# Patient Record
Sex: Male | Born: 1950 | Race: Black or African American | Hispanic: No | Marital: Married | Smoking: Never smoker
Health system: Southern US, Community
[De-identification: ages and names within clinical notes are randomized; demographics above are authoritative.]

## PROBLEM LIST (undated history)

## (undated) DIAGNOSIS — D696 Thrombocytopenia, unspecified: Secondary | ICD-10-CM

## (undated) DIAGNOSIS — E785 Hyperlipidemia, unspecified: Secondary | ICD-10-CM

## (undated) DIAGNOSIS — I10 Essential (primary) hypertension: Secondary | ICD-10-CM

## (undated) DIAGNOSIS — Z889 Allergy status to unspecified drugs, medicaments and biological substances status: Secondary | ICD-10-CM

## (undated) DIAGNOSIS — D72819 Decreased white blood cell count, unspecified: Secondary | ICD-10-CM

## (undated) DIAGNOSIS — M4722 Other spondylosis with radiculopathy, cervical region: Secondary | ICD-10-CM

## (undated) DIAGNOSIS — T7840XA Allergy, unspecified, initial encounter: Secondary | ICD-10-CM

## (undated) DIAGNOSIS — M1990 Unspecified osteoarthritis, unspecified site: Secondary | ICD-10-CM

## (undated) DIAGNOSIS — K219 Gastro-esophageal reflux disease without esophagitis: Secondary | ICD-10-CM

## (undated) DIAGNOSIS — N63 Unspecified lump in unspecified breast: Secondary | ICD-10-CM

## (undated) HISTORY — DX: Unspecified lump in unspecified breast: N63.0

## (undated) HISTORY — DX: Unspecified osteoarthritis, unspecified site: M19.90

## (undated) HISTORY — DX: Allergy, unspecified, initial encounter: T78.40XA

## (undated) HISTORY — DX: Gastro-esophageal reflux disease without esophagitis: K21.9

## (undated) HISTORY — DX: Other spondylosis with radiculopathy, cervical region: M47.22

## (undated) HISTORY — DX: Thrombocytopenia, unspecified: D69.6

## (undated) HISTORY — DX: Essential (primary) hypertension: I10

## (undated) HISTORY — DX: Hyperlipidemia, unspecified: E78.5

## (undated) HISTORY — DX: Allergy status to unspecified drugs, medicaments and biological substances: Z88.9

## (undated) HISTORY — DX: Decreased white blood cell count, unspecified: D72.819

---

## 2000-04-16 HISTORY — PX: OTHER SURGICAL HISTORY: SHX169

## 2001-12-31 ENCOUNTER — Encounter: Payer: Self-pay | Admitting: General Surgery

## 2001-12-31 ENCOUNTER — Ambulatory Visit (HOSPITAL_COMMUNITY): Admission: RE | Admit: 2001-12-31 | Discharge: 2001-12-31 | Payer: Self-pay | Admitting: General Surgery

## 2003-06-16 ENCOUNTER — Encounter: Payer: Self-pay | Admitting: Orthopaedic Surgery

## 2003-09-10 ENCOUNTER — Encounter: Admission: RE | Admit: 2003-09-10 | Discharge: 2003-09-10 | Payer: Self-pay | Admitting: Oncology

## 2003-09-10 ENCOUNTER — Encounter (HOSPITAL_COMMUNITY): Admission: RE | Admit: 2003-09-10 | Discharge: 2003-10-10 | Payer: Self-pay | Admitting: Oncology

## 2004-05-30 ENCOUNTER — Ambulatory Visit (HOSPITAL_COMMUNITY): Admission: RE | Admit: 2004-05-30 | Discharge: 2004-05-30 | Payer: Self-pay | Admitting: Family Medicine

## 2004-09-08 ENCOUNTER — Ambulatory Visit (HOSPITAL_COMMUNITY): Payer: Self-pay | Admitting: Oncology

## 2004-09-08 ENCOUNTER — Encounter: Admission: RE | Admit: 2004-09-08 | Discharge: 2004-09-08 | Payer: Self-pay | Admitting: Oncology

## 2004-09-08 ENCOUNTER — Encounter (HOSPITAL_COMMUNITY): Admission: RE | Admit: 2004-09-08 | Discharge: 2004-10-08 | Payer: Self-pay | Admitting: Oncology

## 2005-04-16 HISTORY — PX: CHOLECYSTECTOMY: SHX55

## 2005-12-20 ENCOUNTER — Ambulatory Visit (HOSPITAL_COMMUNITY): Admission: RE | Admit: 2005-12-20 | Discharge: 2005-12-20 | Payer: Self-pay | Admitting: Family Medicine

## 2005-12-20 ENCOUNTER — Ambulatory Visit: Payer: Self-pay | Admitting: Family Medicine

## 2005-12-20 LAB — CONVERTED CEMR LAB: PSA: NORMAL ng/mL

## 2005-12-21 ENCOUNTER — Ambulatory Visit (HOSPITAL_COMMUNITY): Admission: RE | Admit: 2005-12-21 | Discharge: 2005-12-21 | Payer: Self-pay | Admitting: Family Medicine

## 2005-12-28 ENCOUNTER — Ambulatory Visit: Payer: Self-pay | Admitting: Internal Medicine

## 2006-01-01 ENCOUNTER — Ambulatory Visit: Payer: Self-pay | Admitting: Internal Medicine

## 2006-01-04 ENCOUNTER — Ambulatory Visit (HOSPITAL_COMMUNITY): Admission: RE | Admit: 2006-01-04 | Discharge: 2006-01-04 | Payer: Self-pay | Admitting: Internal Medicine

## 2006-01-04 ENCOUNTER — Ambulatory Visit: Payer: Self-pay | Admitting: Internal Medicine

## 2006-02-01 ENCOUNTER — Ambulatory Visit: Payer: Self-pay | Admitting: Family Medicine

## 2006-02-15 ENCOUNTER — Ambulatory Visit: Payer: Self-pay | Admitting: Cardiology

## 2006-04-05 ENCOUNTER — Encounter (INDEPENDENT_AMBULATORY_CARE_PROVIDER_SITE_OTHER): Payer: Self-pay | Admitting: Specialist

## 2006-04-05 ENCOUNTER — Ambulatory Visit (HOSPITAL_COMMUNITY): Admission: RE | Admit: 2006-04-05 | Discharge: 2006-04-05 | Payer: Self-pay | Admitting: General Surgery

## 2006-05-03 ENCOUNTER — Ambulatory Visit: Payer: Self-pay | Admitting: Family Medicine

## 2007-01-17 ENCOUNTER — Ambulatory Visit: Payer: Self-pay | Admitting: Family Medicine

## 2007-01-17 LAB — CONVERTED CEMR LAB
BUN: 21 mg/dL (ref 6–23)
Basophils Absolute: 0 10*3/uL (ref 0.0–0.1)
Basophils Relative: 1 % (ref 0–1)
CO2: 26 meq/L (ref 19–32)
Calcium: 8.9 mg/dL (ref 8.4–10.5)
Chloride: 103 meq/L (ref 96–112)
Cholesterol: 169 mg/dL (ref 0–200)
Creatinine, Ser: 1.41 mg/dL (ref 0.40–1.50)
Eosinophils Absolute: 0.1 10*3/uL (ref 0.0–0.7)
Eosinophils Relative: 3 % (ref 0–5)
Glucose, Bld: 84 mg/dL (ref 70–99)
HCT: 41.1 % (ref 39.0–52.0)
HDL: 57 mg/dL (ref >39)
Hemoglobin: 13.9 g/dL (ref 13.0–17.0)
LDL Cholesterol: 100 mg/dL — ABNORMAL HIGH (ref 0–99)
Lymphocytes Relative: 41 % (ref 12–46)
Lymphs Abs: 1.4 10*3/uL (ref 0.7–3.3)
MCHC: 33.8 g/dL (ref 30.0–36.0)
MCV: 66.4 fL — ABNORMAL LOW (ref 78.0–100.0)
Monocytes Absolute: 0.2 10*3/uL (ref 0.2–0.7)
Monocytes Relative: 6 % (ref 3–11)
Neutro Abs: 1.7 10*3/uL (ref 1.7–7.7)
Neutrophils Relative %: 49 % (ref 43–77)
Platelets: 120 10*3/uL — ABNORMAL LOW (ref 150–400)
Potassium: 4.4 meq/L (ref 3.5–5.3)
RBC: 6.19 M/uL — ABNORMAL HIGH (ref 4.22–5.81)
RDW: 15.9 % — ABNORMAL HIGH (ref 11.5–14.0)
Sodium: 139 meq/L (ref 135–145)
Total CHOL/HDL Ratio: 3
Triglycerides: 60 mg/dL (ref <150)
VLDL: 12 mg/dL (ref 0–40)
WBC: 3.4 10*3/uL — ABNORMAL LOW (ref 4.0–10.5)

## 2007-02-18 ENCOUNTER — Encounter (HOSPITAL_COMMUNITY): Admission: RE | Admit: 2007-02-18 | Discharge: 2007-03-20 | Payer: Self-pay | Admitting: Oncology

## 2007-02-18 ENCOUNTER — Ambulatory Visit (HOSPITAL_COMMUNITY): Payer: Self-pay | Admitting: Oncology

## 2007-04-17 ENCOUNTER — Encounter: Payer: Self-pay | Admitting: Family Medicine

## 2007-06-13 ENCOUNTER — Ambulatory Visit: Payer: Self-pay | Admitting: Family Medicine

## 2007-06-20 ENCOUNTER — Encounter: Admission: RE | Admit: 2007-06-20 | Discharge: 2007-06-20 | Payer: Self-pay | Admitting: Family Medicine

## 2007-08-02 ENCOUNTER — Encounter: Payer: Self-pay | Admitting: Family Medicine

## 2007-08-02 LAB — CONVERTED CEMR LAB
BUN: 18 mg/dL (ref 6–23)
Basophils Absolute: 0 10*3/uL (ref 0.0–0.1)
Basophils Relative: 1 % (ref 0–1)
CO2: 27 meq/L (ref 19–32)
Calcium: 8.9 mg/dL (ref 8.4–10.5)
Chloride: 104 meq/L (ref 96–112)
Creatinine, Ser: 1.35 mg/dL (ref 0.40–1.50)
Eosinophils Absolute: 0.1 10*3/uL (ref 0.0–0.7)
Eosinophils Relative: 3 % (ref 0–5)
Glucose, Bld: 87 mg/dL (ref 70–99)
HCT: 41.4 % (ref 39.0–52.0)
Hemoglobin: 13.7 g/dL (ref 13.0–17.0)
Lymphocytes Relative: 46 % (ref 12–46)
Lymphs Abs: 1.7 10*3/uL (ref 0.7–4.0)
MCHC: 33.1 g/dL (ref 30.0–36.0)
MCV: 67.4 fL — ABNORMAL LOW (ref 78.0–100.0)
Monocytes Absolute: 0.3 10*3/uL (ref 0.1–1.0)
Monocytes Relative: 7 % (ref 3–12)
Neutro Abs: 1.6 10*3/uL — ABNORMAL LOW (ref 1.7–7.7)
Neutrophils Relative %: 43 % (ref 43–77)
Platelets: 123 10*3/uL — ABNORMAL LOW (ref 150–400)
Potassium: 4.3 meq/L (ref 3.5–5.3)
RBC: 6.14 M/uL — ABNORMAL HIGH (ref 4.22–5.81)
RDW: 15.3 % (ref 11.5–15.5)
Sodium: 140 meq/L (ref 135–145)
WBC: 3.7 10*3/uL — ABNORMAL LOW (ref 4.0–10.5)

## 2007-09-05 ENCOUNTER — Encounter (HOSPITAL_BASED_OUTPATIENT_CLINIC_OR_DEPARTMENT_OTHER): Payer: Self-pay | Admitting: General Surgery

## 2007-09-05 ENCOUNTER — Ambulatory Visit (HOSPITAL_BASED_OUTPATIENT_CLINIC_OR_DEPARTMENT_OTHER): Admission: RE | Admit: 2007-09-05 | Discharge: 2007-09-05 | Payer: Self-pay | Admitting: General Surgery

## 2007-09-06 HISTORY — PX: OTHER SURGICAL HISTORY: SHX169

## 2007-11-17 ENCOUNTER — Encounter: Payer: Self-pay | Admitting: Family Medicine

## 2007-11-21 ENCOUNTER — Encounter: Payer: Self-pay | Admitting: Family Medicine

## 2007-11-21 DIAGNOSIS — K219 Gastro-esophageal reflux disease without esophagitis: Secondary | ICD-10-CM

## 2007-11-21 DIAGNOSIS — E785 Hyperlipidemia, unspecified: Secondary | ICD-10-CM

## 2007-11-21 DIAGNOSIS — N63 Unspecified lump in unspecified breast: Secondary | ICD-10-CM

## 2007-11-21 DIAGNOSIS — I10 Essential (primary) hypertension: Secondary | ICD-10-CM | POA: Insufficient documentation

## 2007-12-05 ENCOUNTER — Ambulatory Visit: Payer: Self-pay | Admitting: Family Medicine

## 2007-12-05 LAB — CONVERTED CEMR LAB
BUN: 22 mg/dL (ref 6–23)
Basophils Absolute: 0 10*3/uL (ref 0.0–0.1)
Basophils Relative: 0 % (ref 0–1)
CO2: 25 meq/L (ref 19–32)
Calcium: 8.7 mg/dL (ref 8.4–10.5)
Chloride: 104 meq/L (ref 96–112)
Cholesterol: 157 mg/dL (ref 0–200)
Creatinine, Ser: 1.24 mg/dL (ref 0.40–1.50)
Eosinophils Absolute: 0.1 10*3/uL (ref 0.0–0.7)
Eosinophils Relative: 4 % (ref 0–5)
Glucose, Bld: 93 mg/dL (ref 70–99)
HCT: 41 % (ref 39.0–52.0)
HDL: 54 mg/dL (ref >39)
Hemoglobin: 13.2 g/dL (ref 13.0–17.0)
LDL Cholesterol: 90 mg/dL (ref 0–99)
Lymphocytes Relative: 39 % (ref 12–46)
Lymphs Abs: 1.3 10*3/uL (ref 0.7–4.0)
MCHC: 32.2 g/dL (ref 30.0–36.0)
MCV: 70.9 fL — ABNORMAL LOW (ref 78.0–100.0)
Monocytes Absolute: 0.3 10*3/uL (ref 0.1–1.0)
Monocytes Relative: 8 % (ref 3–12)
Neutro Abs: 1.6 10*3/uL — ABNORMAL LOW (ref 1.7–7.7)
Neutrophils Relative %: 49 % (ref 43–77)
Platelets: 127 10*3/uL — ABNORMAL LOW (ref 150–400)
Potassium: 4.1 meq/L (ref 3.5–5.3)
RBC: 5.77 M/uL (ref 4.22–5.81)
RDW: 15.5 % (ref 11.5–15.5)
Sodium: 138 meq/L (ref 135–145)
Total CHOL/HDL Ratio: 2.9
Triglycerides: 63 mg/dL (ref <150)
VLDL: 13 mg/dL (ref 0–40)
WBC: 3.3 10*3/uL — ABNORMAL LOW (ref 4.0–10.5)

## 2007-12-08 DIAGNOSIS — J309 Allergic rhinitis, unspecified: Secondary | ICD-10-CM

## 2007-12-08 DIAGNOSIS — D72819 Decreased white blood cell count, unspecified: Secondary | ICD-10-CM | POA: Insufficient documentation

## 2007-12-08 DIAGNOSIS — D696 Thrombocytopenia, unspecified: Secondary | ICD-10-CM | POA: Insufficient documentation

## 2007-12-10 ENCOUNTER — Encounter: Payer: Self-pay | Admitting: Family Medicine

## 2007-12-16 ENCOUNTER — Encounter: Payer: Self-pay | Admitting: Family Medicine

## 2008-02-06 ENCOUNTER — Ambulatory Visit: Payer: Self-pay | Admitting: Family Medicine

## 2008-03-19 ENCOUNTER — Ambulatory Visit: Payer: Self-pay | Admitting: Internal Medicine

## 2008-03-23 ENCOUNTER — Encounter: Payer: Self-pay | Admitting: Internal Medicine

## 2008-03-23 ENCOUNTER — Encounter: Payer: Self-pay | Admitting: Family Medicine

## 2008-03-23 LAB — CONVERTED CEMR LAB
ALT: 16 units/L (ref 0–53)
AST: 17 units/L (ref 0–37)
Albumin: 4.4 g/dL (ref 3.5–5.2)
Alkaline Phosphatase: 81 units/L (ref 39–117)
Amylase: 120 units/L — ABNORMAL HIGH (ref 0–105)
BUN: 19 mg/dL (ref 6–23)
Basophils Absolute: 0 10*3/uL (ref 0.0–0.1)
Basophils Relative: 1 % (ref 0–1)
CO2: 24 meq/L (ref 19–32)
Calcium: 8.8 mg/dL (ref 8.4–10.5)
Chloride: 105 meq/L (ref 96–112)
Creatinine, Ser: 1.33 mg/dL (ref 0.40–1.50)
Eosinophils Absolute: 0.1 10*3/uL (ref 0.0–0.7)
Eosinophils Relative: 2 % (ref 0–5)
Glucose, Bld: 89 mg/dL (ref 70–99)
HCT: 41.2 % (ref 39.0–52.0)
Hemoglobin: 13.6 g/dL (ref 13.0–17.0)
Lipase: 44 units/L (ref 0–75)
Lymphocytes Relative: 37 % (ref 12–46)
Lymphs Abs: 1.4 10*3/uL (ref 0.7–4.0)
MCHC: 33 g/dL (ref 30.0–36.0)
MCV: 67.9 fL — ABNORMAL LOW (ref 78.0–100.0)
Monocytes Absolute: 0.2 10*3/uL (ref 0.1–1.0)
Monocytes Relative: 6 % (ref 3–12)
Neutro Abs: 2.1 10*3/uL (ref 1.7–7.7)
Neutrophils Relative %: 54 % (ref 43–77)
Platelets: 137 10*3/uL — ABNORMAL LOW (ref 150–400)
Potassium: 4.1 meq/L (ref 3.5–5.3)
RBC: 6.07 M/uL — ABNORMAL HIGH (ref 4.22–5.81)
RDW: 15.7 % — ABNORMAL HIGH (ref 11.5–15.5)
Sodium: 143 meq/L (ref 135–145)
Total Bilirubin: 0.4 mg/dL (ref 0.3–1.2)
Total Protein: 6.7 g/dL (ref 6.0–8.3)
WBC: 3.9 10*3/uL — ABNORMAL LOW (ref 4.0–10.5)

## 2008-03-24 ENCOUNTER — Ambulatory Visit (HOSPITAL_COMMUNITY): Admission: RE | Admit: 2008-03-24 | Discharge: 2008-03-24 | Payer: Self-pay | Admitting: Internal Medicine

## 2008-03-24 ENCOUNTER — Encounter: Payer: Self-pay | Admitting: Family Medicine

## 2008-04-05 ENCOUNTER — Ambulatory Visit: Payer: Self-pay | Admitting: Gastroenterology

## 2008-05-12 ENCOUNTER — Ambulatory Visit: Payer: Self-pay | Admitting: Internal Medicine

## 2008-05-14 ENCOUNTER — Ambulatory Visit: Payer: Self-pay | Admitting: Family Medicine

## 2008-05-14 DIAGNOSIS — R109 Unspecified abdominal pain: Secondary | ICD-10-CM | POA: Insufficient documentation

## 2008-06-10 ENCOUNTER — Telehealth (INDEPENDENT_AMBULATORY_CARE_PROVIDER_SITE_OTHER): Payer: Self-pay | Admitting: *Deleted

## 2008-06-21 ENCOUNTER — Encounter (INDEPENDENT_AMBULATORY_CARE_PROVIDER_SITE_OTHER): Payer: Self-pay | Admitting: *Deleted

## 2008-08-24 ENCOUNTER — Telehealth: Payer: Self-pay | Admitting: Family Medicine

## 2008-09-17 ENCOUNTER — Ambulatory Visit: Payer: Self-pay | Admitting: Family Medicine

## 2008-09-17 DIAGNOSIS — R5383 Other fatigue: Secondary | ICD-10-CM

## 2008-09-17 DIAGNOSIS — D649 Anemia, unspecified: Secondary | ICD-10-CM

## 2008-09-17 DIAGNOSIS — M25569 Pain in unspecified knee: Secondary | ICD-10-CM

## 2008-09-17 DIAGNOSIS — M25519 Pain in unspecified shoulder: Secondary | ICD-10-CM

## 2008-09-17 DIAGNOSIS — R5381 Other malaise: Secondary | ICD-10-CM

## 2008-11-04 ENCOUNTER — Telehealth: Payer: Self-pay | Admitting: Family Medicine

## 2008-12-31 ENCOUNTER — Ambulatory Visit: Payer: Self-pay | Admitting: Family Medicine

## 2009-01-04 LAB — CONVERTED CEMR LAB: Retic Ct Pct: 0.8 % (ref 0.4–3.1)

## 2009-06-24 ENCOUNTER — Telehealth: Payer: Self-pay | Admitting: Family Medicine

## 2009-06-24 ENCOUNTER — Ambulatory Visit: Payer: Self-pay | Admitting: Family Medicine

## 2009-06-24 LAB — CONVERTED CEMR LAB
BUN: 18 mg/dL (ref 6–23)
CO2: 29 meq/L (ref 19–32)
Calcium: 9.6 mg/dL (ref 8.4–10.5)
Chloride: 102 meq/L (ref 96–112)
Cholesterol: 157 mg/dL (ref 0–200)
Creatinine, Ser: 1.36 mg/dL (ref 0.40–1.50)
Glucose, Bld: 88 mg/dL (ref 70–99)
HDL: 55 mg/dL (ref >39)
LDL Cholesterol: 92 mg/dL (ref 0–99)
Potassium: 5 meq/L (ref 3.5–5.3)
Sodium: 139 meq/L (ref 135–145)
Total CHOL/HDL Ratio: 2.9
Triglycerides: 50 mg/dL (ref <150)
VLDL: 10 mg/dL (ref 0–40)
Vit D, 25-Hydroxy: 43 ng/mL (ref 30–89)

## 2009-06-28 LAB — CONVERTED CEMR LAB
Basophils Absolute: 0 10*3/uL (ref 0.0–0.1)
Basophils Relative: 0 % (ref 0–1)
Eosinophils Absolute: 0.1 10*3/uL (ref 0.0–0.7)
Eosinophils Relative: 2 % (ref 0–5)
HCT: 43 % (ref 39.0–52.0)
Hemoglobin: 14.1 g/dL (ref 13.0–17.0)
Lymphocytes Relative: 29 % (ref 12–46)
Lymphs Abs: 1.2 10*3/uL (ref 0.7–4.0)
MCHC: 32.7 g/dL (ref 30.0–36.0)
MCV: 70 fL — ABNORMAL LOW (ref 78.0–100.0)
Monocytes Absolute: 0.3 10*3/uL (ref 0.1–1.0)
Monocytes Relative: 6 % (ref 3–12)
Neutro Abs: 2.6 10*3/uL (ref 1.7–7.7)
Neutrophils Relative %: 61 % (ref 43–77)
Platelets: 135 10*3/uL — ABNORMAL LOW (ref 150–400)
RBC: 6.14 M/uL — ABNORMAL HIGH (ref 4.22–5.81)
RDW: 15.4 % (ref 11.5–15.5)
WBC: 4.2 10*3/uL (ref 4.0–10.5)

## 2010-01-06 ENCOUNTER — Telehealth: Payer: Self-pay | Admitting: Family Medicine

## 2010-01-06 DIAGNOSIS — R55 Syncope and collapse: Secondary | ICD-10-CM | POA: Insufficient documentation

## 2010-01-13 ENCOUNTER — Ambulatory Visit: Payer: Self-pay | Admitting: Family Medicine

## 2010-02-03 ENCOUNTER — Encounter: Payer: Self-pay | Admitting: Family Medicine

## 2010-02-03 ENCOUNTER — Ambulatory Visit (HOSPITAL_COMMUNITY): Admission: RE | Admit: 2010-02-03 | Discharge: 2010-02-03 | Payer: Self-pay | Admitting: Neurology

## 2010-02-08 ENCOUNTER — Ambulatory Visit: Payer: Self-pay | Admitting: Family Medicine

## 2010-02-08 DIAGNOSIS — E049 Nontoxic goiter, unspecified: Secondary | ICD-10-CM | POA: Insufficient documentation

## 2010-02-16 ENCOUNTER — Telehealth (INDEPENDENT_AMBULATORY_CARE_PROVIDER_SITE_OTHER): Payer: Self-pay | Admitting: *Deleted

## 2010-02-17 ENCOUNTER — Telehealth (INDEPENDENT_AMBULATORY_CARE_PROVIDER_SITE_OTHER): Payer: Self-pay | Admitting: *Deleted

## 2010-02-25 ENCOUNTER — Encounter: Payer: Self-pay | Admitting: Family Medicine

## 2010-02-28 LAB — CONVERTED CEMR LAB
Basophils Absolute: 0 10*3/uL (ref 0.0–0.1)
Basophils Relative: 1 % (ref 0–1)
Eosinophils Absolute: 0.1 10*3/uL (ref 0.0–0.7)
Eosinophils Relative: 3 % (ref 0–5)
HCT: 40.9 % (ref 39.0–52.0)
Hemoglobin: 13.3 g/dL (ref 13.0–17.0)
Lymphocytes Relative: 31 % (ref 12–46)
Lymphs Abs: 1.2 10*3/uL (ref 0.7–4.0)
MCHC: 32.5 g/dL (ref 30.0–36.0)
MCV: 70.3 fL — ABNORMAL LOW (ref 78.0–100.0)
Monocytes Absolute: 0.2 10*3/uL (ref 0.1–1.0)
Monocytes Relative: 6 % (ref 3–12)
Neutro Abs: 2.3 10*3/uL (ref 1.7–7.7)
Neutrophils Relative %: 59 % (ref 43–77)
Platelets: 137 10*3/uL — ABNORMAL LOW (ref 150–400)
RBC: 5.82 M/uL — ABNORMAL HIGH (ref 4.22–5.81)
RDW: 15.2 % (ref 11.5–15.5)
WBC: 3.8 10*3/uL — ABNORMAL LOW (ref 4.0–10.5)

## 2010-03-17 ENCOUNTER — Ambulatory Visit
Admission: RE | Admit: 2010-03-17 | Discharge: 2010-03-17 | Payer: Self-pay | Source: Home / Self Care | Admitting: Family Medicine

## 2010-03-24 ENCOUNTER — Ambulatory Visit (HOSPITAL_COMMUNITY)
Admission: RE | Admit: 2010-03-24 | Discharge: 2010-03-24 | Payer: Self-pay | Source: Home / Self Care | Attending: Family Medicine | Admitting: Family Medicine

## 2010-03-29 ENCOUNTER — Encounter: Payer: Self-pay | Admitting: Family Medicine

## 2010-03-31 ENCOUNTER — Telehealth (INDEPENDENT_AMBULATORY_CARE_PROVIDER_SITE_OTHER): Payer: Self-pay | Admitting: *Deleted

## 2010-03-31 ENCOUNTER — Encounter: Payer: Self-pay | Admitting: Family Medicine

## 2010-03-31 DIAGNOSIS — R220 Localized swelling, mass and lump, head: Secondary | ICD-10-CM

## 2010-03-31 DIAGNOSIS — R221 Localized swelling, mass and lump, neck: Secondary | ICD-10-CM | POA: Insufficient documentation

## 2010-04-14 ENCOUNTER — Encounter: Payer: Self-pay | Admitting: Family Medicine

## 2010-04-21 ENCOUNTER — Ambulatory Visit (HOSPITAL_COMMUNITY)
Admission: RE | Admit: 2010-04-21 | Discharge: 2010-04-21 | Payer: Self-pay | Source: Home / Self Care | Attending: Otolaryngology | Admitting: Otolaryngology

## 2010-04-21 LAB — CREATININE, SERUM
Creatinine, Ser: 1.34 mg/dL (ref 0.4–1.5)
GFR calc Af Amer: >60 mL/min (ref >60)
GFR calc non Af Amer: 55 mL/min — ABNORMAL LOW (ref >60)

## 2010-04-28 ENCOUNTER — Encounter: Payer: Self-pay | Admitting: Family Medicine

## 2010-05-07 ENCOUNTER — Encounter: Payer: Self-pay | Admitting: Family Medicine

## 2010-05-16 NOTE — Progress Notes (Signed)
Summary: returning your call  Phone Note Call from Patient   Summary of Call: returned your call please call back at [phone] Initial call taken by: Lind Guest,  February 17, 2010 8:19 AM  Follow-up for Phone Call        Pt already aware of results of his carotid doppl;er, unsure if you had called him with appt for ultrasound, if you had pls just call him back no need to send me back a msg, ifyou didi not call just state that nadn suign off pls  Follow-up by: Syliva Overman MD,  February 17, 2010 8:29 AM  Additional Follow-up for Phone Call Additional follow up Details #1::        called patient with his appt. for his [place] on Dec. 5, at 9:30 at [facility] Additional Follow-up by: Curtis Sites,  February 17, 2010 10:07 AM

## 2010-05-16 NOTE — Progress Notes (Signed)
Summary: CALL ON CELL  Phone Note Call from Patient   Summary of Call: WHEN YOU ARE FINISHED FOR THE DAY PLEASE CALL HIM ON CELL # [phone] Initial call taken by: Lind Guest,  January 06, 2010 10:27 AM  Follow-up for Phone Call        msg left on pt's cell that i returned the call Follow-up by: Syliva Overman MD,  January 06, 2010 1:09 PM

## 2010-05-16 NOTE — Letter (Signed)
Summary: TRANSFERRED RECORDS  TRANSFERRED RECORDS   Imported By: Lind Guest 01/12/2010 16:34:13  _____________________________________________________________________  External Attachment:    Type:   Image     Comment:   External Document

## 2010-05-16 NOTE — Progress Notes (Signed)
  Phone Note Other Incoming   Caller: dr simpson Summary of Call: pls order and fax over  labs to the lab the labs ordered earlier this year, call lab personnel and let them know to expect him. Stat on the cBC because of h/o anemia pls make sure ordered as a stat,PLS have the labs reviewed , and call him if there is anything very abnormal, I am uncertain what day he is coming to get lab work. Pls call and verify lab has the order as i intend it to be done Initial call taken by: Syliva Overman MD,  January 06, 2010 1:33 PM  Follow-up for Phone Call        Faxed lab order to the lab with the CBC to be done Stat Follow-up by: Everitt Amber LPN,  January 06, 2010 3:49 PM

## 2010-05-16 NOTE — Assessment & Plan Note (Signed)
Summary: FLU SHOT  Nurse Visit   Vitals Entered By: Everitt Amber LPN (January 13, 2010 10:27 AM) Comments Patient in to recieve flu vaccine     Allergies: 1)  ! Compazine  Orders Added: 1)  Influenza Vaccine NON MCR [00028]   Influenza Vaccine    Vaccine Type: Fluvax Non-MCR    Site: left deltoid    Mfr: novartis     Dose: 0.5 ml    Route: IM    Given by: Everitt Amber LPN    Exp. Date: 08/2010    Lot #: [id number]

## 2010-05-16 NOTE — Assessment & Plan Note (Signed)
Summary: office visit   Vital Signs:  Patient profile:   59 year old male Height:      73 inches Weight:      181.25 pounds BMI:     24.00 O2 Sat:      98 % on Room air Pulse rate:   91 / minute Resp:     16 per minute BP sitting:   120 / 70  (left arm)  Vitals Entered By: Mauricia Area CMA (February 08, 2010 2:01 PM)  O2 Flow:  Room air CC: follow up   Primary Care Provider:  Syliva Overman MD  CC:  follow up.  History of Present Illness: Reports  that he has been  doing well. He has concerns about  new swelling under his neck which will be biopsied later today, as well as neck swelling and possible goiter Denies recent fever or chills. Denies sinus pressure, nasal congestion , ear pain or sore throat. Denies chest congestion, or cough productive of sputum. Denies chest pain, palpitations, PND, orthopnea or leg swelling. Denies abdominal pain, nausea, vomitting, diarrhea or constipation. Denies change in bowel movements or bloody stool. Denies dysuria , frequency, incontinence or hesitancy. Persitent bilateral knee pain, denies swelling Denies headaches, vertigo, seizures. Denies depression, anxiety or insomnia. Denies  rash, lesions, or itch.     Allergies (verified): 1)  ! Compazine  Review of Systems      See HPI General:  Complains of fatigue; denies chills and fever; chronic fatigue, recent "sleep attack" currently being evaluated by neurology. Eyes:  Complains of vision loss-both eyes; denies blurring and discharge; wears corrective lenses. Endo:  Denies cold intolerance, excessive thirst, excessive urination, and heat intolerance. Heme:  Denies abnormal bruising and bleeding. Allergy:  Denies hives or rash and itching eyes.  Physical Exam  General:  alert, well-nourished, and well-hydrated. HEENT: No facial asymmetry,  EOMI, No sinus tenderness, TM's Clear, oropharynx  pink and moist.Goiter, and submental as well as right neck mass   Chest: Clear to  auscultation bilaterally.  CVS: S1, S2, No murmurs, No S3.   Abd: Soft, Nontender.  MS: Adequate ROM spine, hips, shoulders and knees.  Ext: No edema.   CNS: CN 2-12 intact, power tone and sensation normal throughout.   Skin: Intact, no visible lesions or rashes.  Psych: Good eye contact, normal affect.  Memory intact, not anxious or depressed appearing.       Impression & Recommendations:  Problem # 1:  GOITER, UNSPECIFIED (ICD-240.9) Assessment Comment Only  Orders: Radiology other (Radiology Other)  Problem # 2:  SYNCOPE (ICD-780.2) Assessment: Comment Only currently being evaluated by neurology. His carotid doppler shows no significant stenosis  Problem # 3:  HYPERTENSION (ICD-401.9) Assessment: Unchanged  His updated medication list for this problem includes:    Enalapril Maleate 5 Mg Tabs (Enalapril maleate) .[name]... Take 1 tablet by mouth once a day  Orders: T-Basic Metabolic Panel [phone])  BP today: 120/70 Prior BP: 112/82 (06/24/2009)  Labs Reviewed: K+: 5.0 (06/24/2009) Creat: : 1.36 (06/24/2009)   Chol: 157 (06/24/2009)   HDL: 55 (06/24/2009)   LDL: 92 (06/24/2009)   TG: 50 (06/24/2009)  Complete Medication List: 1)  Aspirin Adult Low Strength 81 Mg Tbec (Aspirin) .... Take 1 tablet by mouth once a day 2)  Mens Multivitamin Plus Tabs (Multiple vitamins-minerals) .... Take 1 tablet by mouth once a day 3)  Astelin 137 Mcg/spray Soln (Azelastine hcl) .... 2 puffs two times a day 4)  Enalapril Maleate 5  Mg Tabs (Enalapril maleate) .... Take 1 tablet by mouth once a day  Other Orders: T-CBC w/Diff ([id number]) T-TSH [phone])  Patient Instructions: 1)  ,Follow up appointment in 5.31months 2)  BMP prior to visit, ICD-9: 3)  TSH prior to visit, ICD-9:  fasting asap, pls run CBC as a stat 4)  CBC w/ Diff prior to visit, ICD-9: 5)  No med changes, 6)  Pls start some exercise   Orders Added: 1)  Radiology other [Radiology Other] 2)  Est. Patient  Level IV [29562] 3)  T-CBC w/Diff [[id number]] 4)  T-Basic Metabolic Panel [[id number]] 5)  T-TSH [[id number]]  Appended Document: office visit pls let Dr Lina Sar know that i reviewed the report of the carotid doppler , and there is no significant blockage showing in the arteries in his neck  Appended Document: office visit CALLED PATIENT, LEFT MESSAGE  Appended Document: office visit patient aware

## 2010-05-16 NOTE — Progress Notes (Signed)
Summary: LABS  Phone Note Call from Patient   Summary of Call: LAB CALLED AND WANTS TO KNOW DID YOU RECEIVE ON HIS LABSPLEASE CALL BACK [phone] TO LET TINA KNOW Initial call taken by: Lind Guest,  June 24, 2009 10:52 AM  Follow-up for Phone Call        yes Follow-up by: Syliva Overman MD,  June 24, 2009 12:55 PM
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
## 2010-05-16 NOTE — Assessment & Plan Note (Signed)
Summary: office visit   Vital Signs:  Patient profile:   60 year old male Height:      73 inches Weight:      181.50 pounds BMI:     24.03 Pulse rate:   71 / minute Pulse rhythm:   regular Resp:     16 per minute BP sitting:   112 / 82  (left arm)  Vitals Entered By: Adella Hare LPN (June 24, 2009 8:16 AM) CC: follow-up visit Is Patient Diabetic? No Pain Assessment Patient in pain? no        CC:  follow-up visit.  History of Present Illness: Reports  that he has been  doing well. Denies recent fever or chills. Denies sinus pressure, nasal congestion , ear pain or sore throat. Denies chest congestion, or cough productive of sputum. Denies chest pain, palpitations, PND, orthopnea or leg swelling. Denies abdominal pain, nausea, vomitting, diarrhea or constipation. Denies change in bowel movements or bloody stool. Denies dysuria , frequency, incontinence or hesitancy. Pt has chronic knee pain and bilateral carpl tunnel syndrome. He does not exercise , but understands the importance of doing so and plans to start. Denies headaches, vertigo, seizures. Denies depression, anxiety or insomnia. Denies  rash, lesions, or itch.     Allergies (verified): 1)  ! Compazine  Review of Systems      See HPI Eyes:  Denies blurring, discharge, and double vision. MS:  See HPI; Complains of joint pain and stiffness. Endo:  Denies cold intolerance, excessive hunger, excessive thirst, excessive urination, heat intolerance, polyuria, and weight change. Heme:  Denies abnormal bruising and bleeding. Allergy:  Complains of seasonal allergies; mild, uses astelin prn.  Physical Exam  General:  alert, well-nourished, and well-hydrated. HEENT: No facial asymmetry,  EOMI, No sinus tenderness, TM's Clear, oropharynx  pink and moist.   Chest: Clear to auscultation bilaterally.  CVS: S1, S2, No murmurs, No S3.   Abd: Soft, Nontender.  MS: Adequate ROM spine, hips, shoulders and knees.    Ext: No edema.   CNS: CN 2-12 intact, power tone and sensation normal throughout.   Skin: Intact, no visible lesions or rashes.  Psych: Good eye contact, normal affect.  Memory intact, not anxious or depressed appearing.       Impression & Recommendations:  Problem # 1:  ANEMIA (ICD-285.9) Assessment Comment Only  Hgb: 13.6 (12/31/2008)   Hct: 41.4 (12/31/2008)   Platelets: 152 (12/31/2008) RBC: 5.79 (12/31/2008)   RDW: 15.5 (12/31/2008)   WBC: 3.4 (12/31/2008) MCV: 71.5 (12/31/2008)   MCHC: 32.9 (12/31/2008) Retic Ct: 46.3 K/uL (12/31/2008)   Ferritin: 79 (12/31/2008) Iron: 47 (12/31/2008)   TIBC: 315 (12/31/2008)   % Sat: 15 (12/31/2008) B12: 799 (12/31/2008)   Folate: >20.0 ng/mL (12/31/2008)   TSH: 2.298 (12/31/2008)  Problem # 2:  ALLERGIC RHINITIS (ICD-477.9) Assessment: Unchanged  His updated medication list for this problem includes:    Astelin 137 Mcg/spray Soln (Azelastine hcl) .[name]... 2 puffs two times a day  Problem # 3:  * HEREDITARY THYROMBOCYTOPENIA IN 2006 Assessment: Comment Only  Problem # 4:  HYPERTENSION (ICD-401.9) Assessment: Improved  The following medications were removed from the medication list:    Enalapril Maleate 5 Mg Tabs (Enalapril maleate) ..... One tab by mouth qd His updated medication list for this problem includes:    Enalapril Maleate 5 Mg Tabs (Enalapril maleate) .[name]... Take 1 tablet by mouth once a day  Orders: T-Basic Metabolic Panel [phone])  BP today: 112/82 Prior BP: 130/82 (  12/31/2008)  Labs Reviewed: K+: 4.3 (12/31/2008) Creat: : 1.28 (12/31/2008)   Chol: 167 (12/31/2008)   HDL: 53 (12/31/2008)   LDL: 104 (12/31/2008)   TG: 52 (12/31/2008)  Complete Medication List: 1)  Aspirin Adult Low Strength 81 Mg Tbec (Aspirin) .... Take 1 tablet by mouth once a day 2)  Mens Multivitamin Plus Tabs (Multiple vitamins-minerals) .... Take 1 tablet by mouth once a day 3)  Astelin 137 Mcg/spray Soln (Azelastine hcl) .... 2 puffs two  times a day 4)  Enalapril Maleate 5 Mg Tabs (Enalapril maleate) .... Take 1 tablet by mouth once a day  Other Orders: T-CBC w/Diff ([id number]) T-Lipid Profile [phone]) T-Vitamin D (25-Hydroxy) [phone])  Patient Instructions: 1)  F/u in 5months and 3weeks 2)  BMP prior to visit, ICD-9: 3)  Lipid Panel prior to visit, ICD-9: 4)  CBC w/ Diff prior to visit, ICD-9: 5)  vitamin d Prescriptions: ENALAPRIL MALEATE 5 MG TABS (ENALAPRIL MALEATE) Take 1 tablet by mouth once a day  #90 x 3   Entered and Authorized by:   Syliva Overman MD   Signed by:   Syliva Overman MD on 06/24/2009   Method used:   Electronically to        [facility] [phone]* (retail)       [address]       [place], [place]  [address]       Ph: [phone] or [phone]       Fax: [phone]   RxID:   [id number]

## 2010-05-16 NOTE — Progress Notes (Signed)
Summary: please call  Phone Note Call from Patient   Summary of Call: Please call him at his office number [phone]. Initial call taken by: Curtis Sites,  February 16, 2010 8:23 AM  Follow-up for Phone Call        pls sched [place] for 1 month [pt just  had surgery Follow-up by: Syliva Overman MD,  February 16, 2010 9:01 AM  Additional Follow-up for Phone Call Additional follow up Details #1::        i have sched. his [place] for Dec. 5 at 9:30 at [facility]. Additional Follow-up by: Curtis Sites,  February 17, 2010 10:07 AM

## 2010-05-16 NOTE — Letter (Signed)
Summary: [facility]  [facility]   Imported By: Lind Guest 01/18/2010 12:56:42  _____________________________________________________________________  External Attachment:    Type:   Image     Comment:   External Document

## 2010-05-18 NOTE — Letter (Signed)
Summary: [facility]  [facility]   Imported By: Lind Guest 04/03/2010 13:15:59  _____________________________________________________________________  External Attachment:    Type:   Image     Comment:   External Document

## 2010-05-18 NOTE — Letter (Signed)
Summary: ENT  ENT   Imported By: Lind Guest 05/04/2010 10:32:45  _____________________________________________________________________  External Attachment:    Type:   Image     Comment:   External Document

## 2010-05-18 NOTE — Progress Notes (Signed)
Summary: call back  Phone Note Call from Patient   Summary of Call: call back at [phone] Initial call taken by: Lind Guest,  March 31, 2010 12:10 PM  Follow-up for Phone Call        pls let pt know no mass is seen radiologically, howwever e because of his stated concwern , I will refer hiom to ent, dr Danice Goltz, check and see whern he is available to keep appt and sched at the apprroprite office, he may need to go to [place] if unable to make a thursday afternoon appt  Follow-up by: Syliva Overman MD,  March 31, 2010 4:38 PM  Additional Follow-up for Phone Call Additional follow up Details #1::        called and spoke to Harvie Heck she said any Friday would be good for the ENT doctor.  I will call them after I make the appt. Additional Follow-up by: Curtis Sites,  April 03, 2010 4:47 PM  New Problems: SWELLING, NECK (ICD-784.2)   Additional Follow-up for Phone Call Additional follow up Details #2::    patient has an appt. on Dec 30 at 1:20 to arrive at [address] at Dr. Berlinda Last office in [place]. called and spoke with Harvie Heck and gave her appt. along with address. Follow-up by: Curtis Sites,  April 03, 2010 4:53 PM  New Problems: SWELLING, NECK (ICD-784.2)

## 2010-05-18 NOTE — Letter (Signed)
Summary: ENT  ENT   Imported By: Lind Guest 04/24/2010 13:08:20  _____________________________________________________________________  External Attachment:    Type:   Image     Comment:   External Document

## 2010-05-18 NOTE — Letter (Signed)
Summary: Laboratory/X-Ray Results  [facility]  [address]   [place], [place] [address]   Phone: [phone]  Fax: [phone]    Lab/X-Ray Results  March 29, 2010  MRN: [id number]  [facility]. [facility] [address] [place], [place]  [address]  Dear Mr. FULTZ,  The results of your recent ultrasound has been reviewed and were found: NORMAL    If you have any questions, please contact our office.     Adella Hare LPN

## 2010-07-25 ENCOUNTER — Encounter: Payer: Self-pay | Admitting: Family Medicine

## 2010-07-27 ENCOUNTER — Encounter: Payer: Self-pay | Admitting: Family Medicine

## 2010-07-28 ENCOUNTER — Ambulatory Visit (INDEPENDENT_AMBULATORY_CARE_PROVIDER_SITE_OTHER): Payer: BLUE CROSS/BLUE SHIELD | Admitting: Family Medicine

## 2010-07-28 ENCOUNTER — Encounter: Payer: Self-pay | Admitting: Family Medicine

## 2010-07-28 DIAGNOSIS — D649 Anemia, unspecified: Secondary | ICD-10-CM

## 2010-07-28 DIAGNOSIS — R5383 Other fatigue: Secondary | ICD-10-CM

## 2010-07-28 DIAGNOSIS — R221 Localized swelling, mass and lump, neck: Secondary | ICD-10-CM

## 2010-07-28 DIAGNOSIS — R5381 Other malaise: Secondary | ICD-10-CM

## 2010-07-28 DIAGNOSIS — E785 Hyperlipidemia, unspecified: Secondary | ICD-10-CM

## 2010-07-28 DIAGNOSIS — D72819 Decreased white blood cell count, unspecified: Secondary | ICD-10-CM

## 2010-07-28 DIAGNOSIS — D696 Thrombocytopenia, unspecified: Secondary | ICD-10-CM

## 2010-07-28 DIAGNOSIS — I10 Essential (primary) hypertension: Secondary | ICD-10-CM

## 2010-07-28 MED ORDER — AZELASTINE HCL 0.1 % NA SOLN: 2.0000 | Freq: Two times a day (BID) | NASAL | Status: DC

## 2010-07-28 NOTE — Progress Notes (Signed)
  Subjective:    Patient ID: Mark Sandoval, male    DOB: 1950/11/07, 60 y.o.   MRN: [id number]  HPI  The PT is here for follow up and re-evaluation of chronic medical conditions, medication management and review of recent lab and radiology data.  Preventive health is updated, specifically  Cancer screening, and Immunization.  His zostavax will be due in September. Questions or concerns regarding consultations or procedures which the PT has had in the interim are  addressed. The PT denies any adverse reactions to current medications since the last visit.  There are no new concerns.  There are no specific complaints . Since his last visit , he had a lipoma removed from under his chin, and he had his sleep evaluated. The problem is very poor sleep hygiene , and lack of exercise, unfortunately , all activity is work to home essentially, then to the chair to sleep. He realizes the need to change this, and intends to do so.     Review of Systems Denies recent fever or chills. Denies sinus pressure, nasal congestion, ear pain or sore throat.Does experience increased allergy symptoms at this time of the year Denies chest congestion, productive cough or wheezing. Denies chest pains, palpitations, paroxysmal nocturnal dyspnea, orthopnea and leg swelling Denies abdominal pain, nausea, vomiting,diarrhea or constipation.  Denies rectal bleeding or change in bowel movement. Denies dysuria, frequency, hesitancy or incontinence. Denies joint pain, swelling and limitation and mobility. Denies headaches, seizure, numbness, or tingling. Denies depression, anxiety or insomnia. Denies skin break down or rash.        Objective:   Physical Exam    Patient alert and oriented and in no Cardiopulmonary distress.  HEENT: No facial asymmetry, EOMI, no sinus tenderness, TM's clear, Oropharynx pink and moist.  Neck supple no adenopathy.  Chest: Clear to auscultation bilaterally.  CVS: S1, S2 no  murmurs, no S3.  ABD: Soft non tender. Bowel sounds normal.  Ext: No edema  MS: Adequate ROM spine, shoulders, hips and knees.  Skin: Intact, no ulcerations or rash noted.  Psych: Good eye contact, normal affect. Memory intact not anxious or depressed appearing.  CNS: CN 2-12 intact, power, tone and sensation normal throughout.     Assessment & Plan:  Hypertension : controlled, no change in meds. 2. Leukocytosis: will check labs today. 3. Excessive daytime sleepiness with poor sleep hygiene: pt educated re importance of changing habits to also involve exercise for improved health 4. Seasonal allergies: astelin refilled, increased need anticipated

## 2010-07-28 NOTE — Patient Instructions (Addendum)
F/U in 6 months.  Cbc stat in 6 months.Chem 7, lipids and tsh fasting  It is important that you exercise regularly at least 30 minutes 5 times a week. If you develop chest pain, have severe difficulty breathing, or feel very tired, stop exercising immediately and seek medical attention    Pls change your sleep habits and commit to good hygiene  You can call for your shingles vaccine after your 60th  Birthday, or just wait till f/u visit

## 2010-07-29 ENCOUNTER — Other Ambulatory Visit: Payer: Self-pay | Admitting: Family Medicine

## 2010-07-29 LAB — BASIC METABOLIC PANEL
BUN: 24 mg/dL — ABNORMAL HIGH (ref 6–23)
CO2: 27 mEq/L (ref 19–32)
Calcium: 9.4 mg/dL (ref 8.4–10.5)
Creat: 1.4 mg/dL (ref 0.40–1.50)

## 2010-07-30 LAB — CBC WITH DIFFERENTIAL/PLATELET
Basophils Absolute: 0 10*3/uL (ref 0.0–0.1)
Eosinophils Absolute: 0.1 10*3/uL (ref 0.0–0.7)
Eosinophils Relative: 4 % (ref 0–5)
MCH: 22.6 pg — ABNORMAL LOW (ref 26.0–34.0)
MCHC: 33.9 g/dL (ref 30.0–36.0)
MCV: 66.7 fL — ABNORMAL LOW (ref 78.0–100.0)
Monocytes Absolute: 0.2 10*3/uL (ref 0.1–1.0)
Platelets: 125 10*3/uL — ABNORMAL LOW (ref 150–400)
RDW: 16.1 % — ABNORMAL HIGH (ref 11.5–15.5)

## 2010-08-29 NOTE — Assessment & Plan Note (Signed)
NAMEHENDRYX, Mark Sandoval            CHART#:  [id number]   DATE:  05/12/2008                       DOB:  08-27-1950   FOLLOWUP:  Postprandial abdominal discomfort, intermittent nausea and  vomiting.  Seen in original consultation on 03/19/2008.  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  He has actually put himself on  some years ago.  He is reflected since our encounter last month  regarding his symptoms.  They predate his cholecystectomy and  retrospectively feels that getting his gallbladder really did not help,  although much management of his symptoms.  He has not had any weight  loss with his chronic symptoms.  We did an extensive battery of labs  back in December.  His white count was slightly depressed 3.9, MCV was  67.9, platelet count 137,000 (he had been followed by Dr. Mariel Sleet over  the years for these abnormalities).  They are stable going back to at  least 2007.  His chem-20 looked entirely normal.  His amylase was  slightly elevated at 120 with a normal lipase of 44.  He underwent a CT  of the abdomen and pelvis, which demonstrated a markedly distended  stomach and the radiologist questioned a bezoar, gallbladder out,  bilateral simple renal cyst, large amount of fecal material throughout  the colon, no evidence of obstruction or other abnormality.  He has had  persisting ongoing symptoms since he was last seen.  We dealt in his  dietary habits extensively during the visit today.  He eats at the local  [place] buffet almost daily.  When he does need [place], he eats  [place].  He states he only eats one plate of food and perhaps a bowl  of soup.  He never eats multiple plates.  He has preference for grilled  meats, broccoli, asparagus with soups.  He does not really eat any fried  or dishes with heavy sauces, he  states he does have upper abdominal  discomfort, nausea and vomiting.  He is actually afraid to eat these  foods.  He also notes intolerances to [facility] and gets a similar  symptoms when he eats about any amount of popcorn.  He was on the road  yesterday going to a ball game and ate a [place] burger.  He states he  absolutely avoids things like spaghetti, pizza, and hamburgers as it  will cause severe GI upset similar to the symptoms outlined above.  He  has not had any melena or rectal bleeding.  He describes having 1-3 easy  formed bowel movements daily.  He usually has third bowel movement by  late afternoon.  He returned 3 Hemoccult cards all of which were  negative.  Previously, he had essentially negative colonoscopy and EGD  by Dr. Karilyn Cota in 2007.   CURRENT MEDICATIONS:  See updated list.  He continues to take Prilosec  20 mg on a p.r.n. basis.   ALLERGIES:  Compazine.   PHYSICAL EXAMINATION:  GENERAL:  Today, he looks well.  VITAL SIGNS:  Weight is stable at 186, height 6 feet 1 inches,  temperature 98, BP 118/82, and pulse 76.  SKIN:  Warm and dry.  CHEST:  Lungs are clear to auscultation.  CARDIAC:  Regular rate and rhythm without murmur, gallop, or rub.  ABDOMEN:  Nondistended, positive bowel sounds.  No succussion splash.  Abdomen is entirely soft and nontender without appreciable mass or  organomegaly.   ASSESSMENT:  The patient has a nonspecific ongoing symptoms of  postprandial upper abdominal discomfort with varying degrees of nausea  and sometimes vomiting.  He placed himself on a fairly rigid diet as  described above in 4 years now.  He relates that he really did not feel  like the gallbladder surgery made that much of a difference in his  symptoms.   A 10-15 years ago, he tells me that he was able to eat anything in large  quantities and never gained any weight, pretty much dietary intolerance  to a variety of foods now has limited his diet.   His symptoms are  a bit peculiar.  He had eaten [place] food early in the  day of his CAT scan, but he did have a large stool, but stool burden  enlargement of food in his stomach.  I doubt, he has any type of  mechanical obstruction along the way.  He really does not have symptoms  consistent with a gastric emptying problem.  Findings of EGD and  colonoscopy just little less than 3 years ago (with similar symptoms)  are reassuring.  I wonder if he is simply not evacuating his colon as  well as he thinks he is and he is getting functionally backed up.   RECOMMENDATIONS:  I do not necessarily take anything or might think of  any significant food allergies, although this could easily be interwoven  into his complex symptomatology.  Celiac disease would be a long reach  particularly in [place], but I am going to consider it in the  differential.  Recommendations at this time, I told the patient I would  like to go ahead and just get some plain abdominal films late in the day  after he has had his usual bowel movements since he wants residual stool  load remains in his colon, but would also like to go ahead and do a  celiac antibody panel and they want to render further recommendations in  the very near future as to management.       Jonathon Bellows, M.D.  Electronically Signed     RMR/MEDQ  D:  05/12/2008  T:  05/12/2008  Job:  [id number]   cc:   Milus Mallick. Lodema Hong, M.D.

## 2010-08-29 NOTE — Assessment & Plan Note (Signed)
NAMEMarland Sandoval  [name], [name]            CHART#:  [id number]   DATE:  03/19/2008                       DOB:  [birth year]   A new problem through history of upper abdominal pain postprandially.   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       Jonathon Bellows, M.D.  Electronically Signed     RMR/MEDQ  D:  03/19/2008  T:  03/19/2008  Job:  [id number]

## 2010-08-29 NOTE — Assessment & Plan Note (Signed)
NAME:  Mark Sandoval, Mark Sandoval            CHART#:  [id number]   DATE:  03/19/2008                       DOB:  1950/11/18   REFERRING PHYSICIAN:  Milus Mallick. Lodema Hong, M.D.   REASON FOR CONSULTATION:  Upper abdominal pain.   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  to further evaluate a 3-week history of abdominal pain. The patient has  complaint of some bilateral upper abdominal pain which he describes as  sometimes a stabbing gnawing type of pain occurring predominantly in the  evenings after he eats and persisting several hours into the night which  has diminished his ability to sleep.  He has not had any associated  nausea, vomiting, diarrhea, melena or hematochezia.  He states his bowel  function has continued to be what he describes as normal with one formed  bowel movement or so daily.  He denies any significant recent weight  loss.  He has not had a fever, chills.  He has not noticed any scleral  icterus.  He has not been using any nonsteroidal agents.  He took a  couple of Prilosec tablets several days ago which seemed to ameliorated  his symptoms to a  modest degree.  He is a regular consumer of skin milk  and yogurt daily.  He decided to cut both of these foods out of his diet  3 days ago and almost immediately his symptoms subsided.  He tells me  over the past 2 days, he is pretty much back to baseline and he has had  NO ABDOMINAL PAIN  and he is here for further evaluation.  He denies any  similar symptoms previously, although he did have some dyspeptic  symptoms while on a cruise two years ago which led to a gallbladder  workup.  By history, he was ultimately found to have  biliary dyskinesia  and Dr. Marissa Calamity down in [place] removed his gallbladder.  During the  workup in 2007, he saw Dr. Gerilyn Nestle who performed an EGD which demonstrated  some submucosal esophageal veins.  No  evidence of peptic ulcer disease.  He had a screening colonoscopy at that time which revealed a few  scattered diverticula in the ascending colon.  He had some external  hemorrhoids and anal papillae  as well.  There was no evidence of polyp  or neoplasia.   Prior ultrasound back in 2007 demonstrated a complex cystic renal lesion  for which CAT scan was subsequently performed and indicated this was a  benign lesion.   PAST MEDICAL HISTORY:  Significant for hypertension.  He has a possible  history of mild leukopenia.  He has seen Dr. Mariel Sleet previously.  History of left knee arthroscopy with meniscus repair.  Endoscopic  evaluation as outlined above.   CURRENT MEDICATIONS:  Enalapril 5 mg daily, Naprelan p.r.n.   ALLERGIES:  Compazine.   FAMILY HISTORY:  Biologic father deceased, cause unknown.  Mother died  age 32 with pneumonia.  No history of chronic GI or liver illness.   SOCIAL HISTORY:  The patient is married.  His four children.  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  No tobacco,  rarely consumes alcohol.  No illicit drugs.   REVIEW  OF SYSTEMS:  No chest pain, no dyspnea on exertion.  No fever,  chills.  No clay-colored stools, dark colored urine.  Really no typical  reflux symptoms.  No odynophagia, no dysphagia. Otherwise as in History  of Present Illness.   PHYSICAL EXAMINATION:  Reveals a very pleasant, well-groomed 60 year old  gentleman resting comfortably.  Weight 186, height 6 feet one,  temperature 97.9,  BP 124/80, pulse 60.  SKIN:  Warm and dry.  HEENT:  No scleral icterus.  Conjunctivae are pink.  CHEST:  Lungs clear to auscultation.  HEART:  Regular rate and rhythm without murmur, gallop or rub.  ABDOMEN:  Nondistended, positive bowel sounds.  No bruits.  Entirely  soft and nontender without appreciable mass or hepatosplenomegaly.  EXTREMITIES:  No edema.   IMPRESSION:  This patient is a very pleasant 60 year old gentleman  with  3-week history of somewhat nonspecific upper abdominal pain nearly every  day that waxed and wanted into the evening time and kept him from  getting a good night's rest.  There  was a vague postprandial component  to those symptoms.  He has eliminated skim milk and  yogurt from his  diet just a few days ago when this was associated with at least  temporarily with resolution of those symptoms.   At this time he has no GI symptoms.   The etiology of his symptoms are obscure at this time.  His gallbladder  is out.  He has tolerated yogurt and skim milk for years,  doubtful that  cessation of these foods has much of anything to do with the apparent  improvement/ resolution of his symptoms at this time.  He has had  ongoing symptoms for nearly a month almost every day.  Although I do not  detect any alarm features, his symptoms certainly warrant further  evaluation.  I doubt peptic ulcer disease or stuttering partial bowel  obstruction. Symptoms are  somewhat atypical for pancreatitis as well.   RECOMMENDATIONS:  Will proceed with a CT of the abdomen and pelvis with  IV normal contrast.  Go ahead check Chem 20, amylase, lipase and CBC  today and will ask this patient to return three mail in Hemoccults.  At  this time he has no GI symptoms.  Today I recommend no change in his  current medical regimen or new prescriptions, however, once the CT and  labs are available for review,  I will be making  further  recommendations in the management of this very nice gentleman.   I would like to thank [name] for allowing me to see this  nice gentleman this afternoon.       Jonathon Bellows, M.D.  Electronically Signed     RMR/MEDQ  D:  03/19/2008  T:  03/19/2008  Job:  [id number]   cc:   Milus Mallick. Lodema Hong, M.D.

## 2010-08-29 NOTE — Op Note (Signed)
Mark Sandoval, Mark Sandoval           ACCOUNT NO.:  [id number]   MEDICAL RECORD NO.:  [id number]          PATIENT TYPE:  AMB   LOCATION:  DSC                          FACILITY:  [facility]   PHYSICIAN:  Leonie Man, M.D.   DATE OF BIRTH:  1951/01/19   DATE OF PROCEDURE:  DATE OF DISCHARGE:                               OPERATIVE REPORT   PREOPERATIVE DIAGNOSIS:  Lipoma, left chest wall.   POSTOPERATIVE DIAGNOSIS:  Lipoma, left chest wall.   PROCEDURE:  Excision lipoma, left chest wall.   SURGEON:  Leonie Man, MD   ASSISTANT:  OR nurse.   ANESTHESIA:  Local using 1% lidocaine with epinephrine.   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  He comes to the  operating room now for excision of this lesion which is felt to be a  lipoma.  He understands the risks and potential benefits of surgery and  gives his consent for the same.   PROCEDURE:  With the patient positioned in supine and following the  prepping and draping of the anterior chest wall, the area of the lipoma  is identified and infiltrated with 1% lidocaine with epinephrine.  I  made a transverse incision directly over the mass, deepening this  through the skin and subcutaneous tissue, dissected down to the capsule  of the lipoma.  This was dissected free in all sides and extruded from  the wound and forwarded for pathological evaluation.  The subcutaneous  tissues were reapproximated with 3-0 Vicryl sutures and the skin was  closed with a 5-0 Monocryl suture.  The skin was then reinforced with  Dermabond.  The patient was then removed from the operating room in  stable condition having tolerated the procedure well.      Leonie Man, M.D.  Electronically Signed     PB/MEDQ  D:  09/05/2007  T:  09/06/2007  Job:  [id number]   cc:   Milus Mallick. Lodema Hong, M.D.

## 2010-09-01 NOTE — Op Note (Signed)
NAME:  Mark Sandoval, Mark Sandoval           ACCOUNT NO.:  [id number]   MEDICAL RECORD NO.:  [id number]          PATIENT TYPE:  AMB   LOCATION:  DAY                          FACILITY:  [facility]   PHYSICIAN:  Leonie Man, M.D.   DATE OF BIRTH:  1950/08/11   DATE OF PROCEDURE:  04/05/2006  DATE OF DISCHARGE:                               OPERATIVE REPORT   PREOPERATIVE DIAGNOSIS:  Biliary dyskinesia.   POSTOPERATIVE DIAGNOSIS:  Biliary dyskinesia.   PROCEDURES:  Laparoscopic cholecystectomy with intraoperative  cholangiogram.   SURGEON:  Dr. Lurene Shadow   ASSISTANT:  Dr. Marca Ancona.   ANESTHESIA:  General.   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  There is no evidence of stones within the gallbladder.  He continues to  have symptoms of abdominal bloating, and irritation is associated with  nausea.  He comes to the operating room now after the risks and  potential benefits of surgery have been fully discussed, all questions  answered, and consent obtained for surgery.   PROCEDURE:  Following the induction of satisfactory general anesthesia  with the patient positioned supinely, the abdomen is routinely prepped  and draped to be included in a sterile operative field.  Open  laparoscopy created at the umbilicus with insertion of a Hassan cannula,  insufflation of peritoneal cavity to 15 mmHg pressure using carbon  dioxide.  Camera was inserted and visual exploration of the abdomen  carried out.  The gallbladder is noted be chronically scarred with  multiple adhesions up to the gallbladder wall.  The liver edge is  smooth.  Liver edges sharp.  Liver surfaces smooth.  Note, the small  intestine nor large intestine viewed appeared to be abnormal.   Under direct vision, epigastric and lateral ports were placed, and the  gallbladder was grasped and retracted cephalad while the adhesions  were  taken down serially carrying the dissection down to the ampulla of the  gallbladder.  There was an anterior crossing cystic artery which was  isolated, taken between clamps, and transected.  Dissection carried down  around the cystic duct which was traced up to the cystic duct  gallbladder junction.  This was clipped proximally and opened.  A cystic  duct cholangiogram was carried out by passing a Cook catheter into the  abdomen and the cystic duct through which one-half strength gastric  Renografin was injected under fluoroscopic guidance.  The resulting  cholangiogram showed prompt flow of contrast into the duodenum, normal  caliber ducts with no filling defects.  The cystic duct catheter was  then removed, and the cystic duct was triply clipped and transected and  in normal position, there was also a cystic artery which was isolated,  doubly clipped, and transected.  Posterior branch of this artery was  also isolated, clipped, and transected, and the gallbladder was then  dissected free from the liver bed using electrocautery and maintaining  hemostasis throughout the course of the dissection.  At the end of  dissection, all areas were checked for hemostasis, additional bleeding  points  treated with electrocautery.  The right upper quadrant and  undersurface were then thoroughly irrigated with normal saline and  aspirated.  The gallbladder was placed in an Endopouch and retrieved  through the umbilical port without difficulty.  The pneumoperitoneum was  allowed to deflate after the lateral ports were removed under direct  vision.  Sponge, instrument, and sharp counts were verified and the  wounds closed in layers as follows:  The umbilical wound in 2 layers  with 0 Vicryl and 4-0 Monocryl, epigastric and flank wounds closed with  4-0 Monocryl sutures.  All wounds were reinforced with Steri-Strips.  Sterile dressings were applied, the anesthetic reversed, and the patient  removed  from the operating room to the recovery room in stable  condition.  He tolerated the procedure well.      Leonie Man, M.D.  Electronically Signed     PB/MEDQ  D:  04/05/2006  T:  04/05/2006  Job:  [id number]

## 2010-09-01 NOTE — Consult Note (Signed)
NAME:  Mark Sandoval, Mark Sandoval           ACCOUNT NO.:  [id number]   MEDICAL RECORD NO.:  [id number]          PATIENT TYPE:  AMB   LOCATION:  DAY                           FACILITY:  [facility]   PHYSICIAN:  Lionel December, M.D.    DATE OF BIRTH:  March 22, 1951   DATE OF CONSULTATION:  12/28/2005  DATE OF DISCHARGE:                                   CONSULTATION   PRESENTING COMPLAINT:  Epigastric and retrosternal discomfort, nausea and  recent history of burping.   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.  The patient  was seen by her last week and had lab studies.  He was begun on Zegerid.  However, he is still not feeling better and requested that he be seen prior  to further workup.   His present illness began about 3 weeks ago while he was on a trip overseas.  He has been to several countries including a [place], [place], [place],  [place], [place] and two other islands.  About 3 weeks ago he developed  intractable burping which was postprandial and continued for a couple of  days.  He did not experience nausea, vomiting or abdominal pain.  However, a  few days later he developed transient nausea.  He also started to experience  epigastric and retrosternal pain described to be different and burning.  This symptom would get worse with meals and he therefore cut back on eating.  He did not experience diarrhea, melena, rectal bleeding, fever, chills or  night sweats.  He has not lost any weight recently.  He was seen by Dr.  Lodema Hong on December 20, 2005.  He was begun on Zegerid.  He thought he was  feeling better but over the weekend, after he ate popcorn, he noted a  relapse of these symptoms.  He denies dysphagia odynophagia or throat  symptoms.  He does not take any NSAIDs.   REVIEW OF SYSTEMS:  Negative for joint pains or skin rash.   MEDICATIONS:  1. Enalapril. 5 mg daily.  2. Zegerid 40 mg  q.a.m.[name]   PAST MEDICAL HISTORY:  1. Mild hypertension.  He states he has been on therapy intermittently.      At one point he was seen by hypertension specialist and therapy was      discontinued.  2. He had left knee arthroscopy for meniscus repair about 4 years ago.  3. He also has possibly leukopenia and has been evaluated by Dr.      Mariel Sleet.   ALLERGIES:  COMPAZINE which caused dystonia  and __________ symptoms.   FAMILY HISTORY:  Mother had multiple myoma and lived for over 10 years and  died of pneumonia at age 74.  Father died at young age, although he did not  know him.  He has a sister who was diabetic for over 20 years and died at  age 73.  He has two brothers in good health.   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  He has four children.  One of his daughters  also has low cell count.  He has never smoked cigarettes and drinks alcohol  occasionally.   PHYSICAL EXAMINATION:  GENERAL APPEARANCE:  Pleasant well-developed thin  African American male who is in no acute distress.  VITAL SIGNS:  He weighs 188-1/2 pound.  He is 6 feet 1 inch tall.  Pulse 68  per minute, blood pressure 118/70, temperature is 98.3.  HEENT:  Conjunctivae is pink.  Sclerae is nonicteric.  Oropharyngeal mucosa  is normal.  NECK:  No neck masses or thyromegaly noted.  CARDIAC:  Regular rhythm.  Normal S1 and S3.  No murmur or gallop noted.  LUNGS:  Clear to auscultation.  ABDOMEN:  Flat.  Bowel sounds are normal, soft and nontender, he has minimal  mid epigastric tenderness but no organomegaly or masses noted.  RECTAL:  Examination deferred.  EXTREMITIES:  No clubbing or edema noted.   Recent lab data from December 20, 2004, from Dr. Anthony Sar office, sodium  143, potassium 4.6, chloride 102, CO2 is 23, glucose 76, BUN 16, creatinine  1.36, calcium 9.5, bilirubin 0.8, ALP 86, AST 17, ALT 15, total protein 7,  albumin 4.7.   TSH 1.975.  PSA 0.62.  His H pylori serology is less than 0.4.  Cholesterol 187, TSH 78, HDL 52 and LDL 119.   He had chest x-ray on December 20, 2004, which suggested slight  hyperinflation versus early COPD.  He had ultrasound on the same date  revealing normal gallbladder without gallstones.  He had complex cystic  lesion in left kidney which was further evaluated with MRI abdomen on  December 21, 2005.  This lesion in the left kidney is Bosniak cyst category  II.  He had tiny cysts in the liver and right kidney.  Spleen and adrenal  gland and pancreas appeared normal.  There were two tiny lesions in the  liver with high T2 signal felt to be small cyst.   He had hepatobiliary scan on December 21, 2005, and ejection fraction was  23.4% which is low.  No mention of any reproduction of symptoms.   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  He has been treated with Zegerid for a few days which has not made  much difference.  His history is significant for recent trips to multiple  countries for vacation and he actually had onset of this illness while on a  trip.  However, he has not developed any diarrhea.  Imaging studies reveal  no significant abnormality to hepatobiliary or pancreatic hepatobiliary  system or pancreas.  However, hepatobiliary scan reveals low ejection  fraction.  His H pylori serology is negative.  It is possible that he may  have enteric infection that he acquired on his recent trip.  He could also  have peptic ulcer disease in spite of negative H pylori.  If his upper GI  tract is clean, cholecystectomy would be recommended.   He is an average risk for colorectal carcinoma and colonoscopy would be advised given his age.   RECOMMENDATIONS:  1. He will have CBC with  differential today.  2. Continue Zegerid at 40 mg q.a.m., samples given.  3. Empiric therapy with metronidazole 250 mg p.o. t.i.d. for 7 days.  4. Will  proceed with diagnostic EGD and a screening colonoscopy which is      planned for 1 week from now.  I have reviewed both the procedures and      risks with [name] and have answered his question that he is      agreeable.  5. Hemoccult x1.   We would like to thank [name] for the opportunity to participate in the  care of this gentleman.      Lionel December, M.D.  Electronically Signed     NR/MEDQ  D:  12/28/2005  T:  12/31/2005  Job:  [id number]   cc:   Milus Mallick. Lodema Hong, M.D.  Fax: [phone]

## 2010-09-01 NOTE — Letter (Signed)
February 15, 2006    Milus Mallick. Lodema Hong, M.D.  [address]  [place], [place] [address]   RE:  Mark Sandoval, Mark Sandoval  MRN:  [id number]  /  DOB:  1950-11-28   Dear Claris Che:   It has been my pleasure evaluating Mr. Shugart in consultation today in the  office at your request prior to planned cholecystectomy.  As you know, this  nice gentleman has enjoyed generally excellent health.  He does have mild  hypertension that is well controlled with a single agent.  He has never been  hospitalized.  He has only undergone outpatient colonoscopy and arthroscopy  of his left knee.  He has previously suffered an adverse reaction to  Compazine.  His only medications are Enalapril 5 mg daily and aspirin 81 mg  daily.   He has had a recent lipid profile which is acceptable.  His lifestyle is  sedentary.   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  No use of alcohol products nor tobacco.   SOCIAL HISTORY:  Positive for cancer in his mother and diabetes is a sister.  No prominent coronary disease.   REVIEW OF SYSTEMS:  Positive for the need for corrective lenses and stable  weight and appetite.  All other systems reviewed and are negative.   PHYSICAL EXAMINATION:  GENERAL:  Pleasant, trim gentleman in no acute  distress.  VITAL SIGNS:  Weight 187.  Blood pressure 110/75, heart rate 65 and regular,  respirations 16.  HEENT: Normal funduscopic examination; EOM full; normal oral mucosa.  NECK:  No jugular venous distention; normal carotid upstrokes without  bruits.  ENDOCRINE:  No thyromegaly.  HEMATOPOIETIC: No adenopathy.  SKIN: No significant lesions.  CARDIAC: Normal first and second heart sounds; fourth heart sound present;  normal PMI.  LUNGS: Clear.  ABDOMEN: Soft and nontender; no organomegaly; no masses; normal bowel  sounds.  EXTREMITIES: Normal distal pulses; no edema.  NEUROMUSCULAR: Symmetric strength and tone; normal cranial nerves.   EKG:  Normal sinus rhythm; borderline atrial abnormality; delayed R wave  progression; rightward axis.   IMPRESSION:  Mr. Calabretta has modest cardiovascular risk factors.  He has no  cardiopulmonary symptoms.  Treatment under your management is optimal.  Accordingly, I do not believe that further preoperative testing or treatment  would be of value.  His cardiac risk is perhaps 1% of an event over the next  10 years.  In this setting, perioperative beta blocker are not necessary.  I  would recommend proceeding with surgery without any further preparation or  treatment.   Mr. Jacome is considering an increase in regular physical activity.  Beyond  that, I have no recommendations for his general overall health, which is  excellent.  Please send him back to me at any time that I can offer further  assistance with his medical care.    Sincerely,      Gerrit Friends. Dietrich Pates, MD, [facility]  Electronically Signed    RMR/MedQ  DD: 02/15/2006  DT: 02/15/2006  Job #: [id number]

## 2010-09-01 NOTE — Op Note (Signed)
NAME:  Mark Sandoval, Mark Sandoval           ACCOUNT NO.:  [id number]   MEDICAL RECORD NO.:  [id number]          PATIENT TYPE:  AMB   LOCATION:  DAY                           FACILITY:  [facility]   PHYSICIAN:  Mark Sandoval, M.D.    DATE OF BIRTH:  1951-04-10   DATE OF PROCEDURE:  01/04/2006  DATE OF DISCHARGE:  01/04/2006                                 OPERATIVE REPORT   PROCEDURE:  Esophagogastroduodenoscopy followed by a screening colonoscopy,  followed by colonoscopy.   INDICATION:  Mark Sandoval is a 60 year old African-American male who has been  experiencing epigastric pain and some heartburn.  He has not responded to  PPI.  His H. pylori serology has been negative.  His ultrasound was  unremarkable, but HIDA scan reveals low EF.  He also underwent CT, as there  was a question of a cystic lesion in one of his kidneys; but  this turned  out to be a benign process.  He is undergoing diagnostic EGD and screening  colonoscopy.  Procedures were reviewed with the patient and informed consent  was obtained.   MEDICATIONS FOR CONSCIOUS SEDATION:  Benzocaine spray for pharyngeal topical  anesthesia. Demerol 50 mg IV, Versed 6 mg IV in divided dose.   FINDINGS:  Procedure performed in endoscopy suite.  The patient's vital  signs and O2 saturations were monitored during the procedure and remained  stable.   PROCEDURES:  ESOPHAGOGASTRODUODENOSCOPY.  The patient was placed in the left  lateral position and the Olympus videoscope was passed via oropharynx  without any difficulty into the esophagus.   Mucosa of the esophagus normal.  GE junction was at 42 cm from the incisors.  At a distal few centimeters it had very small linear columns of submucosal  veins.  Pictures were taken for the record.   Stomach was empty and distended very well with insufflation.  Folds of the  proximal stomach were normal.  Examination of the mucosa at body, antrum,  pyloric channel as well as annularis, fundus and cardia  was normal.   Duodenal bulbar mucosa was normal.  Scope was passed into second part of the  duodenum; there mucosa and folds were normal.  The endoscope was withdrawn.  The patient prepared for procedure #2.   COLONOSCOPY.  Rectal examination was performed.  No abnormality noted on  external or digital exam.  The Olympus videoscope was placed into the rectum  and advanced under vision into sigmoid colon beyond.  Preparation was  excellent, except he had some formed stool at the cecum.  Cecal landmarks  (i.e. ileocecal valve and appendiceal orifice) were well seen, though.  They  were a few diverticula at the ascending colon.  None were seen distal to the  hepatic flexure.  There were no polyps or other mucosal abnormalities noted.  Rectal mucosa was normal.  The scope was retroflexed to examine the  anorectal junction.  He had a prominent papilla and hemorrhoids below the  dentate line.  Endoscope was straightened and withdrawn.  The patient  tolerated the procedure well.   FINAL DIAGNOSES:  1. No evidence of peptic ulcer  disease.  2. Very tiny columns of submucosal esophageal veins for questionable      significance.  3. On imaging studies there is no evidence of liver disease, and his      transaminases have been normal.  4. If he __________ , liver biopsy could be performed at the time of the      lapar cholecystectomy, if this is needed.  5. A few scattered diverticula at the ascending colon.  6. External hemorrhoids and a prominent anal papilla.  7. No evidence of polyps.   RECOMMENDATIONS:  I would like for him to continue Zegerid.  Since he is  finishing up on his metronidazole, would like to see how he feels next week  before refer made for cholecystectomy.      Mark Sandoval, M.D.  Electronically Signed     NR/MEDQ  D:  01/04/2006  T:  01/07/2006  Job:  [id number]   cc:   Mark Sandoval, M.D.  Fax: [phone]

## 2010-09-14 ENCOUNTER — Other Ambulatory Visit: Payer: Self-pay | Admitting: Family Medicine

## 2011-01-03 ENCOUNTER — Encounter: Payer: Self-pay | Admitting: Orthopedic Surgery

## 2011-01-03 ENCOUNTER — Ambulatory Visit (INDEPENDENT_AMBULATORY_CARE_PROVIDER_SITE_OTHER): Payer: BLUE CROSS/BLUE SHIELD | Admitting: Orthopedic Surgery

## 2011-01-03 VITALS — Resp 20 | Ht 73.0 in | Wt 179.0 lb

## 2011-01-03 DIAGNOSIS — M898X1 Other specified disorders of bone, shoulder: Secondary | ICD-10-CM | POA: Insufficient documentation

## 2011-01-03 DIAGNOSIS — M25519 Pain in unspecified shoulder: Secondary | ICD-10-CM

## 2011-01-03 NOTE — Progress Notes (Signed)
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  This pain is exacerbated when he sitting in a soft chair.  He has no other time including golfing and walking.  Review of systems he denies numbness or tingling.  Exam healthy-appearing well-developed well-nourished male.  He is 6 foot 1 inch tall and weighs 179 pounds with his clothes on  He has some tenderness over the medial soft tissues between the scapula and the spine with no tenderness over the vertebral bodies spinous processes or interspinous spaces.  Shoulder range of motion is normal.  Shoulder strength normal.  Shoulder stability normal.  No bony tenderness over the scapula.  Impression scapular pain  Continue chiropractic treatment and ibuprofen.

## 2011-01-03 NOTE — Patient Instructions (Signed)
advil or ibuprofen for pain   Give it a little more time

## 2011-01-23 LAB — IRON AND TIBC
Iron: 127
UIBC: 218

## 2011-01-23 LAB — FOLATE: Folate: >20

## 2011-01-23 LAB — DIFFERENTIAL
Basophils Absolute: 0
Eosinophils Absolute: 0.1
Eosinophils Relative: 2
Lymphs Abs: 1.4

## 2011-01-23 LAB — CBC
HCT: 42.1
MCV: 69 — ABNORMAL LOW
Platelets: 148 — ABNORMAL LOW
RDW: 15.2 — ABNORMAL HIGH

## 2011-01-23 LAB — FERRITIN: Ferritin: 80 (ref 22–322)

## 2011-01-29 ENCOUNTER — Encounter: Payer: Self-pay | Admitting: Family Medicine

## 2011-02-02 ENCOUNTER — Ambulatory Visit (INDEPENDENT_AMBULATORY_CARE_PROVIDER_SITE_OTHER): Payer: BLUE CROSS/BLUE SHIELD | Admitting: Family Medicine

## 2011-02-02 ENCOUNTER — Encounter: Payer: Self-pay | Admitting: Family Medicine

## 2011-02-02 VITALS — BP 120/70 | HR 78 | Resp 16 | Ht 73.75 in | Wt 179.1 lb

## 2011-02-02 DIAGNOSIS — M542 Cervicalgia: Secondary | ICD-10-CM

## 2011-02-02 DIAGNOSIS — M25529 Pain in unspecified elbow: Secondary | ICD-10-CM

## 2011-02-02 DIAGNOSIS — Z2911 Encounter for prophylactic immunotherapy for respiratory syncytial virus (RSV): Secondary | ICD-10-CM

## 2011-02-02 DIAGNOSIS — I10 Essential (primary) hypertension: Secondary | ICD-10-CM

## 2011-02-02 DIAGNOSIS — M545 Low back pain, unspecified: Secondary | ICD-10-CM

## 2011-02-02 DIAGNOSIS — D649 Anemia, unspecified: Secondary | ICD-10-CM

## 2011-02-02 DIAGNOSIS — M546 Pain in thoracic spine: Secondary | ICD-10-CM

## 2011-02-02 DIAGNOSIS — Z23 Encounter for immunization: Secondary | ICD-10-CM

## 2011-02-02 DIAGNOSIS — M25519 Pain in unspecified shoulder: Secondary | ICD-10-CM

## 2011-02-02 DIAGNOSIS — E785 Hyperlipidemia, unspecified: Secondary | ICD-10-CM

## 2011-02-02 DIAGNOSIS — G8929 Other chronic pain: Secondary | ICD-10-CM

## 2011-02-02 DIAGNOSIS — M898X1 Other specified disorders of bone, shoulder: Secondary | ICD-10-CM

## 2011-02-02 DIAGNOSIS — M25521 Pain in right elbow: Secondary | ICD-10-CM | POA: Insufficient documentation

## 2011-02-02 MED ORDER — AZELASTINE HCL 0.1 % NA SOLN: 2.0000 | Freq: Two times a day (BID) | NASAL | Status: DC

## 2011-02-02 NOTE — Progress Notes (Signed)
  Subjective:    Patient ID: Mark Sandoval, male    DOB: 08-30-1950, 60 y.o.   MRN: [id number]  HPI Progressive right arm pain worsening in the past 3 weeks, even hand shaking is a problem, using tens and topical cooling with some relief. Acute episode of severe back pain approx 4 weeks ago while turning  On the shower.Saw orthopedics and returned to chiropracter , no imaging done, eventually relief after 4 weeks.Sees chiropracter regularly for massages of neck and back. Still no regular exercise.    Review of Systems See HPI Denies recent fever or chills. Denies sinus pressure, nasal congestion, ear pain or sore throat. Denies chest congestion, productive cough or wheezing. Denies chest pains, palpitations and leg swelling Denies abdominal pain, nausea, vomiting,diarrhea or constipation.   Denies dysuria, frequency, hesitancy or incontinence. Denies headaches, seizures, numbness, or tingling. Denies depression, anxiety or insomnia. Denies skin break down or rash.        Objective:   Physical Exam Patient alert and oriented and in no cardiopulmonary distress.  HEENT: No facial asymmetry, EOMI, no sinus tenderness,  oropharynx pink and moist.  Neck supple no adenopathy.  Chest: Clear to auscultation bilaterally.  CVS: S1, S2 no murmurs, no S3.  ABD: Soft non tender. Bowel sounds normal.  Ext: No edema  MS: Adequate ROM spine, shoulders, hips and knees.  Skin: Intact, no ulcerations or rash noted.  Psych: Good eye contact, normal affect. Memory intact not anxious or depressed appearing.  CNS: CN 2-12 intact, power, tone and sensation normal throughout.        Assessment & Plan:

## 2011-02-02 NOTE — Patient Instructions (Addendum)
F/U in 6 months  Fasting chem 7 lipid, cbc as a stat as soon as possible  You will be referred to Dr.  Amanda Pea re right forearm pain and also for x rays  Of   neck , mid back and lower back   Zostavax and flu vaccine today

## 2011-02-03 NOTE — Assessment & Plan Note (Signed)
Acute severe and disabling upper back pain no significant aggravating factor, will send for x rays of thoracic spine, concern is for severe spinal stenosis or disc disease. I explained since symptoms have essentially resolved after 4 weeks no need for MRI at this time since management would be unchanged. Xrays are to evaluate basic bony anatomy only

## 2011-02-03 NOTE — Assessment & Plan Note (Signed)
4 week h/o pain which is aggravated by even hand shaking, refer to ortho for eval

## 2011-02-03 NOTE — Assessment & Plan Note (Signed)
Controlled, no change in medication  

## 2011-02-03 NOTE — Assessment & Plan Note (Signed)
Acute severe mid back pain with no significant aggravating factor, will obtain x ray of thoracic spine

## 2011-02-03 NOTE — Assessment & Plan Note (Signed)
Chronic neck pain radiating to shoulders, recent severe disabling mid back pan, chronic use of chiropracter, will order x ray study

## 2011-02-09 ENCOUNTER — Other Ambulatory Visit: Payer: Self-pay | Admitting: Family Medicine

## 2011-02-09 LAB — CBC WITH DIFFERENTIAL/PLATELET
Basophils Absolute: 0 K/uL (ref 0.0–0.1)
Basophils Relative: 0 % (ref 0–1)
Eosinophils Absolute: 0.2 K/uL (ref 0.0–0.7)
Eosinophils Relative: 4 % (ref 0–5)
HCT: 43.1 % (ref 39.0–52.0)
Hemoglobin: 14.3 g/dL (ref 13.0–17.0)
Lymphocytes Relative: 37 % (ref 12–46)
Lymphs Abs: 1.4 K/uL (ref 0.7–4.0)
MCH: 22.5 pg — ABNORMAL LOW (ref 26.0–34.0)
MCHC: 33.2 g/dL (ref 30.0–36.0)
MCV: 67.8 fL — ABNORMAL LOW (ref 78.0–100.0)
Monocytes Absolute: 0.2 K/uL (ref 0.1–1.0)
Monocytes Relative: 5 % (ref 3–12)
Neutro Abs: 1.9 K/uL (ref 1.7–7.7)
Neutrophils Relative %: 51 % (ref 43–77)
Platelets: 134 K/uL — ABNORMAL LOW (ref 150–400)
RBC: 6.36 MIL/uL — ABNORMAL HIGH (ref 4.22–5.81)
RDW: 15.6 % — ABNORMAL HIGH (ref 11.5–15.5)
WBC: 3.7 K/uL — ABNORMAL LOW (ref 4.0–10.5)

## 2011-02-09 LAB — LIPID PANEL
Cholesterol: 169 mg/dL (ref 0–200)
HDL: 55 mg/dL
LDL Cholesterol: 105 mg/dL — ABNORMAL HIGH (ref 0–99)
Total CHOL/HDL Ratio: 3.1 ratio
Triglycerides: 47 mg/dL
VLDL: 9 mg/dL (ref 0–40)

## 2011-02-09 LAB — BASIC METABOLIC PANEL
BUN: 18 mg/dL (ref 6–23)
Potassium: 4.7 mEq/L (ref 3.5–5.3)

## 2011-02-10 LAB — LIPID PANEL
LDL Cholesterol: 106 mg/dL — ABNORMAL HIGH (ref 0–99)
VLDL: 10 mg/dL (ref 0–40)

## 2011-06-15 ENCOUNTER — Other Ambulatory Visit: Payer: Self-pay | Admitting: Family Medicine

## 2011-08-03 ENCOUNTER — Encounter: Payer: Self-pay | Admitting: Family Medicine

## 2011-08-03 ENCOUNTER — Ambulatory Visit (INDEPENDENT_AMBULATORY_CARE_PROVIDER_SITE_OTHER): Payer: BLUE CROSS/BLUE SHIELD | Admitting: Family Medicine

## 2011-08-03 VITALS — BP 130/80 | HR 81 | Resp 16 | Ht 73.0 in | Wt 177.1 lb

## 2011-08-03 DIAGNOSIS — D649 Anemia, unspecified: Secondary | ICD-10-CM

## 2011-08-03 DIAGNOSIS — E785 Hyperlipidemia, unspecified: Secondary | ICD-10-CM

## 2011-08-03 DIAGNOSIS — K219 Gastro-esophageal reflux disease without esophagitis: Secondary | ICD-10-CM

## 2011-08-03 DIAGNOSIS — J309 Allergic rhinitis, unspecified: Secondary | ICD-10-CM

## 2011-08-03 DIAGNOSIS — R5383 Other fatigue: Secondary | ICD-10-CM

## 2011-08-03 DIAGNOSIS — D696 Thrombocytopenia, unspecified: Secondary | ICD-10-CM

## 2011-08-03 DIAGNOSIS — R5381 Other malaise: Secondary | ICD-10-CM

## 2011-08-03 DIAGNOSIS — I10 Essential (primary) hypertension: Secondary | ICD-10-CM

## 2011-08-03 LAB — CBC WITH DIFFERENTIAL/PLATELET
Eosinophils Absolute: 0.1 10*3/uL (ref 0.0–0.7)
Eosinophils Relative: 3 % (ref 0–5)
Hemoglobin: 13.4 g/dL (ref 13.0–17.0)
Lymphs Abs: 1.4 10*3/uL (ref 0.7–4.0)
MCH: 22.2 pg — ABNORMAL LOW (ref 26.0–34.0)
MCV: 67.2 fL — ABNORMAL LOW (ref 78.0–100.0)
Monocytes Relative: 5 % (ref 3–12)
Neutrophils Relative %: 52 % (ref 43–77)
RBC: 6.04 MIL/uL — ABNORMAL HIGH (ref 4.22–5.81)

## 2011-08-03 LAB — TSH: TSH: 1.942 u[IU]/mL (ref 0.350–4.500)

## 2011-08-03 LAB — BASIC METABOLIC PANEL
Chloride: 104 mEq/L (ref 96–112)
Potassium: 4.9 mEq/L (ref 3.5–5.3)

## 2011-08-03 NOTE — Patient Instructions (Signed)
F/u in 6 month  It is important that you exercise regularly at least 30 minutes 5 times a week. If you develop chest pain, have severe difficulty breathing, or feel very tired, stop exercising immediately and seek medical attention    Fasting chem 7, cbc stat and tSH today.  Fasting lipid, chem 7, cBC stat in 6 month before next visit  pls keep up with eye exams and urologu exams

## 2011-08-03 NOTE — Assessment & Plan Note (Signed)
No current flare, has astelin

## 2011-08-03 NOTE — Assessment & Plan Note (Signed)
Controlled, no change in medication  

## 2011-08-03 NOTE — Assessment & Plan Note (Signed)
Monitor twice yearly, has been evaluated in the past by hematology

## 2011-08-03 NOTE — Progress Notes (Signed)
  Subjective:    Patient ID: Mark Sandoval, male    DOB: 10/30/1950, 61 y.o.   MRN: [id number]  HPI The PT is here for follow up and re-evaluation of chronic medical conditions, medication management and review of any available recent lab and radiology data.  Preventive health is updated, specifically  Cancer screening and Immunization.   Questions or concerns regarding consultations or procedures which the PT has had in the interim are  addressed. The PT denies any adverse reactions to current medications since the last visit.  There are no new concerns.  There are no specific complaints       Review of Systems Please advise patient Cholesterol is elevated.  They need to increase fresh or frozen fruit and vegetable intake.  Reduce red meats max twice weekly.  Increase white meat, baked, grilled or broiled e.g. Fish, [place] breast and chicken breast.   Denies recent fever or chills. Denies sinus pressure, nasal congestion, ear pain or sore throat. Denies chest congestion, productive cough or wheezing. Denies chest pains, palpitations and leg swelling Denies abdominal pain, nausea, vomiting,diarrhea or constipation.   Denies dysuria, frequency, hesitancy or incontinence. 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 Denies headaches, seizures, numbness, or tingling. Denies depression, anxiety or insomnia. Denies skin break down or rash.        Objective:   Physical Exam  Patient alert and oriented and in no cardiopulmonary distress.  HEENT: No facial asymmetry, EOMI, no sinus tenderness,  oropharynx pink and moist.  Neck supple no adenopathy.  Chest: Clear to auscultation bilaterally.  CVS: S1, S2 no murmurs, no S3.  ABD: Soft non tender. Bowel sounds normal.  Ext: No edema  MS: Adequate ROM spine, shoulders, hips and knees.  Skin: Intact, no ulcerations or rash noted.  Psych: Good eye  contact, normal affect. Memory intact not anxious or depressed appearing.  CNS: CN 2-12 intact, power, tone and sensation normal throughout.       Assessment & Plan:

## 2011-08-03 NOTE — Assessment & Plan Note (Signed)
Asymptomatic with dietary change

## 2011-09-17 NOTE — Progress Notes (Signed)
  Subjective:    Patient ID: Mark Sandoval, male    DOB: 25-Jun-1950, 61 y.o.   MRN: [id number]  HPI Addendum ACCIDENTALLY entered in incorrect chart, will be placed in correct chaert, Randy Falor   Review of Systems     Objective:   Physical Exam        Assessment & Plan:

## 2011-09-17 NOTE — Progress Notes (Signed)
  Subjective:    Patient ID: Mark Sandoval, male    DOB: 1950-07-25, 61 y.o.   MRN: [id number]  HPI Pt called in on 09/17/2011 stating she had been denied long term care coverage based on this visit. 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

## 2012-01-16 ENCOUNTER — Other Ambulatory Visit: Payer: Self-pay | Admitting: Family Medicine

## 2012-02-08 ENCOUNTER — Other Ambulatory Visit: Payer: Self-pay | Admitting: Family Medicine

## 2012-02-08 LAB — CBC WITH DIFFERENTIAL/PLATELET
Basophils Relative: 0 % (ref 0–1)
HCT: 41.7 % (ref 39.0–52.0)
Hemoglobin: 14.2 g/dL (ref 13.0–17.0)
Lymphs Abs: 1.4 10*3/uL (ref 0.7–4.0)
MCHC: 34.1 g/dL (ref 30.0–36.0)
Monocytes Absolute: 0.3 10*3/uL (ref 0.1–1.0)
Monocytes Relative: 6 % (ref 3–12)
Neutro Abs: 1.9 10*3/uL (ref 1.7–7.7)
RBC: 6.24 MIL/uL — ABNORMAL HIGH (ref 4.22–5.81)

## 2012-02-09 LAB — BASIC METABOLIC PANEL
BUN: 18 mg/dL (ref 6–23)
CO2: 30 mEq/L (ref 19–32)
Chloride: 102 mEq/L (ref 96–112)
Glucose, Bld: 77 mg/dL (ref 70–99)
Potassium: 4.5 mEq/L (ref 3.5–5.3)

## 2012-02-09 LAB — LIPID PANEL
Cholesterol: 203 mg/dL — ABNORMAL HIGH (ref 0–200)
LDL Cholesterol: 130 mg/dL — ABNORMAL HIGH (ref 0–99)
Total CHOL/HDL Ratio: 3.2 Ratio
VLDL: 10 mg/dL (ref 0–40)

## 2012-02-11 LAB — CREATININE WITH EST GFR
Creat: 1.4 mg/dL — ABNORMAL HIGH (ref 0.50–1.35)
GFR, Est African American: 62 mL/min
GFR, Est Non African American: 54 mL/min — ABNORMAL LOW

## 2012-02-15 ENCOUNTER — Ambulatory Visit (INDEPENDENT_AMBULATORY_CARE_PROVIDER_SITE_OTHER): Payer: BLUE CROSS/BLUE SHIELD | Admitting: Family Medicine

## 2012-02-15 ENCOUNTER — Encounter: Payer: Self-pay | Admitting: Family Medicine

## 2012-02-15 VITALS — BP 128/90 | HR 77 | Resp 16 | Ht 73.0 in | Wt 177.0 lb

## 2012-02-15 DIAGNOSIS — Z23 Encounter for immunization: Secondary | ICD-10-CM

## 2012-02-15 DIAGNOSIS — E785 Hyperlipidemia, unspecified: Secondary | ICD-10-CM

## 2012-02-15 DIAGNOSIS — D72819 Decreased white blood cell count, unspecified: Secondary | ICD-10-CM

## 2012-02-15 DIAGNOSIS — R221 Localized swelling, mass and lump, neck: Secondary | ICD-10-CM | POA: Insufficient documentation

## 2012-02-15 DIAGNOSIS — I10 Essential (primary) hypertension: Secondary | ICD-10-CM

## 2012-02-15 DIAGNOSIS — D649 Anemia, unspecified: Secondary | ICD-10-CM

## 2012-02-15 DIAGNOSIS — D696 Thrombocytopenia, unspecified: Secondary | ICD-10-CM

## 2012-02-15 DIAGNOSIS — J309 Allergic rhinitis, unspecified: Secondary | ICD-10-CM

## 2012-02-15 NOTE — Progress Notes (Signed)
  Subjective:    Patient ID: Mark Sandoval, male    DOB: 1951/02/07, 61 y.o.   MRN: [id number]  HPI Over 2 year h/o intermittent alternating tender and painless swelling on right neck, has had fibromas, and neuromas in the past wants tertairy eval of same. No significant change in size noted. No associated fever , chills , weight loss or fatigue. Has chronic leukopenia and thrombocytopenia evaluated in the past by hematology. No regular physical activity, has not had meds this morning , and has been less diligent with diet, deterioration noted in lipids this lab draw   Review of Systems See HPI Denies recent fever or chills. Denies sinus pressure, nasal congestion, ear pain or sore throat. Denies chest congestion, productive cough or wheezing. Denies chest pains, palpitations and leg swelling Denies abdominal pain, nausea, vomiting,diarrhea or constipation.   Denies dysuria, frequency, hesitancy or incontinence. Denies joint pain, swelling and limitation in mobility. Denies headaches, seizures, numbness, or tingling. Denies depression, anxiety or insomnia. Denies skin break down or rash.        Objective:   Physical Exam  Patient alert and oriented and in no cardiopulmonary distress.  HEENT: No facial asymmetry, EOMI, no sinus tenderness,  oropharynx pink and moist.  Neck supple no adenopathy.Mild fullness in right side of neck, non tender  Chest: Clear to auscultation bilaterally.  CVS: S1, S2 no murmurs, no S3.  ABD: Soft non tender. Bowel sounds normal.  Ext: No edema  MS: Adequate ROM spine, shoulders, hips and knees.  Skin: Intact, no ulcerations or rash noted.  Psych: Good eye contact, normal affect. Memory intact not anxious or depressed appearing.  CNS: CN 2-12 intact, power, tone and sensation normal throughout.       Assessment & Plan:

## 2012-02-15 NOTE — Patient Instructions (Addendum)
F/U in 6 month  You are referred to ENT at [facility] re right neck swelling   You need to start daily physical activity this well add significant quality to your health  And improve blood pressur4  Please reduce fried and fatty foods  Fasting chem 7, CBC as a stat. In 5 month  Flu vaccine

## 2012-02-16 NOTE — Assessment & Plan Note (Signed)
Unchanged, continue to follow lab, has been eval by hematology

## 2012-02-16 NOTE — Assessment & Plan Note (Signed)
Deterioration. Hyperlipidemia:Low fat diet discussed and encouraged.  Pt to start daily exercise for overall health

## 2012-02-16 NOTE — Assessment & Plan Note (Signed)
Concerned about right neck swelling for years,wants ENT eval at [facility], referral to be made. My clinical impression is that there is  no pathology

## 2012-02-16 NOTE — Assessment & Plan Note (Signed)
Uncontrolled at this visit, has missed med this morning and is in no regular physical activity, needs to address both.

## 2012-02-16 NOTE — Assessment & Plan Note (Signed)
No acute flare.  

## 2012-02-16 NOTE — Assessment & Plan Note (Signed)
Unchanged, has seen hematology, no significant underlying pathology, pt just to continue to follow regualrly

## 2012-03-21 ENCOUNTER — Other Ambulatory Visit: Payer: Self-pay | Admitting: Family Medicine

## 2012-05-14 ENCOUNTER — Ambulatory Visit (INDEPENDENT_AMBULATORY_CARE_PROVIDER_SITE_OTHER): Payer: BLUE CROSS/BLUE SHIELD | Admitting: Family Medicine

## 2012-05-14 ENCOUNTER — Encounter: Payer: Self-pay | Admitting: Family Medicine

## 2012-05-14 VITALS — BP 124/64 | HR 74 | Resp 16 | Ht 73.0 in | Wt 179.1 lb

## 2012-05-14 DIAGNOSIS — R109 Unspecified abdominal pain: Secondary | ICD-10-CM

## 2012-05-14 DIAGNOSIS — I10 Essential (primary) hypertension: Secondary | ICD-10-CM

## 2012-05-14 NOTE — Patient Instructions (Signed)
F/u as before.  You are referred to dr Karilyn Cota for further investigation of new onset abdominal pain , his office will contact you

## 2012-05-14 NOTE — Progress Notes (Signed)
  Subjective:    Patient ID: Mark Sandoval, male    DOB: 1951-03-05, 62 y.o.   MRN: [id number]  HPI  3 m,onth h/o intermittent RLQ pain at its worse has been as high as an 8 , one night in [place] he recalls being awakened from sl;eep by pain, used antacids with some relief,however trubing symptom persists. Has not noted change in stool caliber or other, no blood ion stool or black stool, has not been having stool heme checks unfortunately, has prostate exam with urology. No known family h/o colon cancer Was evaluated by ENT at [facility] in the Fall for perceived neck mass, no intervention needed, follow up is as needed  Review of Systems See HPI Denies recent fever or chills. Denies sinus pressure, nasal congestion, ear pain or sore throat. Denies chest congestion, productive cough or wheezing. Denies chest pains, palpitations and leg swelling Denies dysuria, frequency, hesitancy or incontinence. Intermittent joint pain unchanged, denies swelling and limitation in mobility. Denies headaches, seizures, numbness, or tingling. Denies depression, anxiety or insomnia. Denies skin break down or rash.        Objective:   Physical Exam  Patient alert and oriented and in no cardiopulmonary distress.  HEENT: No facial asymmetry, EOMI, no sinus tenderness,  oropharynx pink and moist.  Neck supple no adenopathy.  Chest: Clear to auscultation bilaterally.  CVS: S1, S2 no murmurs, no S3.  ABD: Soft non tender. Bowel sounds normal.No organomegaly or masses Rectal : deferred  Ext: No edema  MS: Adequate ROM spine, shoulders, hips and knees.  Skin: Intact, no ulcerations or rash noted.  Psych: Good eye contact, normal affect. Memory intact not anxious or depressed appearing.  CNS: CN 2-12 intact, power, tone and sensation normal throughout.       Assessment & Plan:

## 2012-05-14 NOTE — Assessment & Plan Note (Signed)
Controlled, no change in medication DASH diet and commitment to daily physical activity for a minimum of 30 minutes discussed and encouraged, as a part of hypertension management. The importance of attaining a healthy weight is also discussed.  

## 2012-06-02 ENCOUNTER — Telehealth (INDEPENDENT_AMBULATORY_CARE_PROVIDER_SITE_OTHER): Payer: Self-pay | Admitting: *Deleted

## 2012-06-02 NOTE — Telephone Encounter (Signed)
Spoke with [name] and he will look at his schedule then call back with dates. Once he calls back, an apt will be scheduled.

## 2012-08-19 ENCOUNTER — Telehealth: Payer: Self-pay | Admitting: Family Medicine

## 2012-08-19 DIAGNOSIS — E785 Hyperlipidemia, unspecified: Secondary | ICD-10-CM

## 2012-08-19 DIAGNOSIS — E049 Nontoxic goiter, unspecified: Secondary | ICD-10-CM

## 2012-08-19 DIAGNOSIS — I10 Essential (primary) hypertension: Secondary | ICD-10-CM

## 2012-08-19 NOTE — Telephone Encounter (Signed)
Labs reordered due to being cancelled in system.  Faxed over to [facility] lab.

## 2012-08-21 ENCOUNTER — Encounter (INDEPENDENT_AMBULATORY_CARE_PROVIDER_SITE_OTHER): Payer: Self-pay | Admitting: Internal Medicine

## 2012-08-21 ENCOUNTER — Ambulatory Visit (INDEPENDENT_AMBULATORY_CARE_PROVIDER_SITE_OTHER): Payer: BC Managed Care – PPO | Admitting: Internal Medicine

## 2012-08-21 VITALS — BP 132/84 | HR 72 | Temp 98.5°F | Ht 73.0 in | Wt 178.9 lb

## 2012-08-21 DIAGNOSIS — R109 Unspecified abdominal pain: Secondary | ICD-10-CM

## 2012-08-21 LAB — CBC WITH DIFFERENTIAL/PLATELET
Basophils Absolute: 0 10*3/uL (ref 0.0–0.1)
Basophils Relative: 0 % (ref 0–1)
Eosinophils Absolute: 0.1 10*3/uL (ref 0.0–0.7)
MCH: 22.9 pg — ABNORMAL LOW (ref 26.0–34.0)
MCHC: 34.7 g/dL (ref 30.0–36.0)
Neutro Abs: 1.9 10*3/uL (ref 1.7–7.7)
Neutrophils Relative %: 56 % (ref 43–77)
Platelets: 130 10*3/uL — ABNORMAL LOW (ref 150–400)
RDW: 15.7 % — ABNORMAL HIGH (ref 11.5–15.5)

## 2012-08-21 LAB — BASIC METABOLIC PANEL
BUN: 20 mg/dL (ref 6–23)
Calcium: 9.2 mg/dL (ref 8.4–10.5)
Glucose, Bld: 86 mg/dL (ref 70–99)
Potassium: 4.4 mEq/L (ref 3.5–5.3)
Sodium: 137 mEq/L (ref 135–145)

## 2012-08-21 MED ORDER — OMEPRAZOLE 20 MG PO CPDR: 20.0000 mg | DELAYED_RELEASE_CAPSULE | Freq: Every day | ORAL | Status: DC

## 2012-08-21 NOTE — Progress Notes (Deleted)
Subjective:     Patient ID: Mark Sandoval, male   DOB: [birth year], 62 y.o.   MRN: [id number]  HPI  EGD/Colonoscopy 2007:  FINAL DIAGNOSES:  1. No evidence of peptic ulcer disease.  2. Very tiny columns of submucosal esophageal veins for questionable  significance.  3. On imaging studies there is no evidence of liver disease, and his  transaminases have been normal.  4. If he __________ , liver biopsy could be performed at the time of the  lapar cholecystectomy, if this is needed.  5. A few scattered diverticula at the ascending colon.  6. External hemorrhoids and a prominent anal papilla.  7. No evidence of polyps.      Review of Systems     Objective:   Physical Exam     Assessment:     ***    Plan:     ***

## 2012-08-21 NOTE — Consult Note (Signed)
Presenting complaint;  Right-sided abdominal pain of over 12 months duration.  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 Syliva Sandoval for GI evaluation. He was last seen by me In 2007 which is reviewed under past medical history. He now presents with over 12 month history of intermittent right mid abdominal pain which is described to be dull but he has periods when pain is sharp and intense. He noted worsening pain earlier this year. He took Prilosec OTC for 2 weeks or so and noted improvement/resolution of his pain. He stopped this medication and pain has returned. Pain may last from minutes to hours. This pain is not associated with nausea vomiting diarrhea or urinary symptoms. He has good appetite and he has found no association of this pain with his meals. He denies weight loss. As a matter of fact she has gained 8 pounds this year. His bowels move daily. He denies melena or rectal bleeding. H he does most of his work in sitting position in a slightly been to the right side. He gives a remote history of shingles and points to her right lower quadrant a site involvement but he did not develop chronic pain. He has wrist pain and tennis elbow and uses ibuprofen sporadically. He does not take ibuprofen every week and on some weeks he may take 1 or 2 doses. Review of the systems is positive for intermittent knee pain but he has not had any in the last few weeks.  Current Medications: Current Outpatient Prescriptions  Medication Sig Dispense Refill  . enalapril (VASOTEC) 5 MG tablet TAKE 1 TABLET BY MOUTH ONCE A DAY  90 tablet  2  . ibuprofen (ADVIL,MOTRIN) 400 MG tablet Take 400 mg by mouth every 6 (six) hours as needed.        [name] aspirin (RA ASPIRIN EC ADULT LOW ST) 81 MG EC tablet Take 81 mg by mouth daily.        [name] azelastine (ASTELIN) 137 MCG/SPRAY nasal spray TAKE 2 PUFFS TWO TIMES A DAY  30 mL  3   No current  facility-administered medications for this visit.   Past medical history;  Hypertension. History of borderline serum creatinine previously felt to be normal and now mildly elevated. History of shingles several years ago with a rash below the belt line on the right side. History of mild thrombocytopenia and leukopenia for which she's been evaluated by Dr. Laurie Sandoval. Final impression was that this pattern is normal for [name]. One of his daughters also has mild thrombocytopenia. Left knee arthroscopy several years ago for sports related injury. 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 Had EGD and screening colonoscopy in September 2007. EGD was performed for heartburn and epigastric pain. His H. pylori serology was negative. Colonoscopy revealed few diverticula at ascending colon. Laparoscopic cholecystectomy for biliary dyskinesia in December 2007. Allergies; Prochlorperazine.  Family history; Father died at young age of alcohol-related problems but he had no contact with him. Mother had multiple myeloma and died at age 84. He has 2 brothers in good health. Had one sister who was diabetic and died at age 15.  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 He is married and has 3 daughters and one son. They're all in good health. One daughter has mild thrombocytopenia. He has never smoked cigarettes and  drinks alcohol occasionally.    Objective: Blood pressure 132/84, pulse 72, temperature 98.5 F (36.9 C), height 6\' 1"  (1.854 m), weight 178 lb 14.4 oz (81.149 kg). Pleasant well developed thin male in no acute distress. Conjunctiva is pink. Sclera is nonicteric Oropharyngeal mucosa is normal. No neck masses or thyromegaly noted. Cardiac exam with regular rhythm normal S1 and S2. No murmur or gallop noted. Lungs are clear to auscultation. Abdomen is  flat. Bowel sounds are normal. On palpation is soft and nontender without organomegaly or masses. Patient reported no discomfort on raising lower extremities off examination table. No LE edema or clubbing noted.  Labs/studies Results: CBC from 08/19/2012 WBC 3.5, H&H 13.9 and 40.1 MCV 66.2 and platelet count 130 K. Serum sodium 137, potassium 4.4, chloride 98, CO2 29, glucose 86, BUN 20, creatinine 1.40. Serum calcium 9.2. Creatinine was 1.40 in October 2013 and 1.4 on in October 2012.    Assessment:  #1. Intermittent right mid abdominal pain of over one year duration. This pain does not appear to be of GI origin but he did respond to short course of Prilosec OTC. Will retreat him and see what happens. His pain appears to be either musculoskeletal or neuropathic pain. He did experience shingles in this area several years ago but never had postherpetic neuralgia. He will be further evaluated with abdominopelvic CT with contrast but first will make sure Dr. Lodema Sandoval has no objection given borderline creatinine. #2. Mild thrombocytopenia. Evaluation in the past has been negative. There is nothing to suggest chronic liver disease. He had CT back in December 2009 which was negative for splenomegaly. #3. Low MCV appears to be chronic. Will consider iron studies unless he's had them previously. #4.He is average risk for CRC. Last colonoscopy was in September 2009 and I do not see reason to do one now and will check Hemoccult.    Recommendations;  Hemoccult x1. Omeprazole 20 mg by mouth every morning. Abdominopelvic CT with contrast to be scheduled after consultation with Dr. Lodema Sandoval regarding patient's borderline serum creatinine. Patient advised to keep symptom diary as to timing, onset of his pain and if he experiences  associated symptoms. We'll also do LFTs with his next blood work. Presuming CT is unremarkable we'll plan to see him back in 8 weeks.

## 2012-08-21 NOTE — Patient Instructions (Addendum)
Please keep symptoms aren't for the next 4-8 weeks. Take omeprazole 20 mg by mouth 30-60 minutes before breakfast. Abdominopelvic CT to be scheduled. Hemoccult x1

## 2012-08-22 ENCOUNTER — Ambulatory Visit (INDEPENDENT_AMBULATORY_CARE_PROVIDER_SITE_OTHER): Payer: BLUE CROSS/BLUE SHIELD | Admitting: Family Medicine

## 2012-08-22 ENCOUNTER — Encounter: Payer: Self-pay | Admitting: Family Medicine

## 2012-08-22 VITALS — BP 140/90 | HR 61 | Resp 16 | Ht 73.0 in | Wt 179.0 lb

## 2012-08-22 DIAGNOSIS — D72819 Decreased white blood cell count, unspecified: Secondary | ICD-10-CM

## 2012-08-22 DIAGNOSIS — D696 Thrombocytopenia, unspecified: Secondary | ICD-10-CM

## 2012-08-22 DIAGNOSIS — R109 Unspecified abdominal pain: Secondary | ICD-10-CM

## 2012-08-22 DIAGNOSIS — E785 Hyperlipidemia, unspecified: Secondary | ICD-10-CM

## 2012-08-22 DIAGNOSIS — R221 Localized swelling, mass and lump, neck: Secondary | ICD-10-CM

## 2012-08-22 DIAGNOSIS — R5383 Other fatigue: Secondary | ICD-10-CM

## 2012-08-22 DIAGNOSIS — R5381 Other malaise: Secondary | ICD-10-CM

## 2012-08-22 DIAGNOSIS — R220 Localized swelling, mass and lump, head: Secondary | ICD-10-CM

## 2012-08-22 DIAGNOSIS — I10 Essential (primary) hypertension: Secondary | ICD-10-CM

## 2012-08-22 MED ORDER — ENALAPRIL MALEATE 5 MG PO TABS: 5.0000 mg | ORAL_TABLET | Freq: Every day | ORAL | Status: DC

## 2012-08-22 NOTE — Patient Instructions (Addendum)
F/u in end October, call if you need me before  Blood pressure slightly ellevated today. No med change, please commit to dailyexercise.   Continue once daily aspirin  We will send lab sheet for next visit CBc and diff as a stat. Fasting lipid, chem 7 in October before visit

## 2012-08-22 NOTE — Progress Notes (Signed)
  Subjective:    Patient ID: Mark Sandoval, male    DOB: 11/23/1950, 62 y.o.   MRN: [id number]  HPI The PT is here for follow up and re-evaluation of chronic medical conditions, medication management and review of any available recent lab and radiology data.  Preventive health is updated, specifically  Cancer screening and Immunization.   Questions or concerns regarding consultations or procedures which the PT has had in the interim are  Addressed.Saw GI yesterday due to chronic left lower quadrant pain. Has been started on a PPI and ha an upcoming ct scan, will need rept chem 7 immediately following the scan since contrast is being taken. Pain is promarily at night, in hiswords, when he is no longer rushing around in the day The PT denies any adverse reactions to current medications since the last visit.  Still has a concern of left neck swelling not ready for re evaluation , has been seen by ENT at [facility] last year for this. Still no exercise   Review of Systems See HPI Denies recent fever or chills. Denies sinus pressure, nasal congestion, ear pain or sore throat. Denies chest congestion, productive cough or wheezing. Denies chest pains, palpitations and leg swelling Denies nausea, vomiting,diarrhea or constipation.   Denies dysuria, frequency, hesitancy or incontinence. Denies joint pain, swelling and limitation in mobility. Denies headaches, seizures, numbness, or tingling. Denies depression, anxiety or insomnia. Denies skin break down or rash.        Objective:   Physical Exam Patient alert and oriented and in no cardiopulmonary distress.  HEENT: No facial asymmetry, EOMI, no sinus tenderness,  oropharynx pink and moist.  Neck supple no adenopathy.  Chest: Clear to auscultation bilaterally.  CVS: S1, S2 no murmurs, no S3.  ABD: Soft non tender. Bowel sounds normal.  Ext: No edema  MS: Adequate ROM spine, shoulders, hips and knees.  Skin: Intact, no ulcerations or  rash noted.  Psych: Good eye contact, normal affect. Memory intact not anxious or depressed appearing.  CNS: CN 2-12 intact, power, tone and sensation normal throughout.        Assessment & Plan:

## 2012-08-23 NOTE — Assessment & Plan Note (Signed)
Hyperlipidemia:Low fat diet discussed and encouraged.  Updated lab next visit 

## 2012-08-23 NOTE — Assessment & Plan Note (Signed)
Inadequate control, no med change at this time Will consider a change to amlodipine next visit if BP still elevated DASH diet and commitment to daily physical activity for a minimum of 30 minutes discussed and encouraged, as a part of hypertension management. The importance of attaining a healthy weight is also discussed.

## 2012-08-23 NOTE — Assessment & Plan Note (Signed)
Right lower quadrant pain, ma have psychological overlay,experienced only at night when no distraction present, currently undergoing GI eval

## 2012-08-23 NOTE — Assessment & Plan Note (Signed)
Chronic /unchanged, followe by hematology also

## 2012-08-26 ENCOUNTER — Ambulatory Visit (HOSPITAL_COMMUNITY)
Admission: RE | Admit: 2012-08-26 | Discharge: 2012-08-26 | Disposition: A | Discharge status: Home / Self Care | Payer: BC Managed Care – PPO | Source: Ambulatory Visit | Attending: Internal Medicine | Admitting: Internal Medicine

## 2012-08-26 ENCOUNTER — Encounter (HOSPITAL_COMMUNITY): Payer: Self-pay

## 2012-08-26 ENCOUNTER — Ambulatory Visit (HOSPITAL_COMMUNITY): Payer: BLUE CROSS/BLUE SHIELD

## 2012-08-26 DIAGNOSIS — I10 Essential (primary) hypertension: Secondary | ICD-10-CM | POA: Insufficient documentation

## 2012-08-26 DIAGNOSIS — K5730 Diverticulosis of large intestine without perforation or abscess without bleeding: Secondary | ICD-10-CM | POA: Insufficient documentation

## 2012-08-26 DIAGNOSIS — R109 Unspecified abdominal pain: Secondary | ICD-10-CM | POA: Insufficient documentation

## 2012-08-26 DIAGNOSIS — Q619 Cystic kidney disease, unspecified: Secondary | ICD-10-CM | POA: Insufficient documentation

## 2012-08-26 MED ORDER — IOHEXOL 300 MG/ML  SOLN
100.0000 mL | Freq: Once | INTRAMUSCULAR | Status: AC | PRN
  Administered 2012-08-26: 100 mL via INTRAVENOUS

## 2012-08-28 NOTE — Progress Notes (Signed)
Apt has been scheduled for 09/11/12 at 3:45 pm with Dr. Karilyn Cota.

## 2012-09-01 ENCOUNTER — Encounter (INDEPENDENT_AMBULATORY_CARE_PROVIDER_SITE_OTHER): Payer: Self-pay

## 2012-12-10 ENCOUNTER — Other Ambulatory Visit: Payer: Self-pay | Admitting: Family Medicine

## 2012-12-12 ENCOUNTER — Telehealth (INDEPENDENT_AMBULATORY_CARE_PROVIDER_SITE_OTHER): Payer: Self-pay | Admitting: *Deleted

## 2012-12-12 NOTE — Telephone Encounter (Signed)
LM with wife, Harvie Heck, that Dr. Earnest Rosier need to schedule a f/u apt with Dr. Karilyn Cota. If he would please give [place] a date and time that would work best for him we would be glad to schedule the apt.

## 2013-02-02 ENCOUNTER — Telehealth: Payer: Self-pay | Admitting: Family Medicine

## 2013-02-02 NOTE — Telephone Encounter (Signed)
Order printed and put up front to be collected

## 2013-02-11 ENCOUNTER — Other Ambulatory Visit: Payer: Self-pay | Admitting: Family Medicine

## 2013-02-12 LAB — COMPLETE METABOLIC PANEL WITH GFR
ALT: 19 U/L (ref 0–53)
Albumin: 4.2 g/dL (ref 3.5–5.2)
Alkaline Phosphatase: 73 U/L (ref 39–117)
CO2: 28 mEq/L (ref 19–32)
GFR, Est Non African American: 64 mL/min
Glucose, Bld: 86 mg/dL (ref 70–99)
Potassium: 4.3 mEq/L (ref 3.5–5.3)
Sodium: 140 mEq/L (ref 135–145)
Total Bilirubin: 0.6 mg/dL (ref 0.3–1.2)
Total Protein: 6.4 g/dL (ref 6.0–8.3)

## 2013-02-12 LAB — LIPID PANEL
HDL: 65 mg/dL (ref >39)
LDL Cholesterol: 95 mg/dL (ref 0–99)
Total CHOL/HDL Ratio: 2.6 Ratio

## 2013-02-13 ENCOUNTER — Encounter: Payer: Self-pay | Admitting: Family Medicine

## 2013-02-13 ENCOUNTER — Encounter (INDEPENDENT_AMBULATORY_CARE_PROVIDER_SITE_OTHER): Payer: Self-pay

## 2013-02-13 ENCOUNTER — Ambulatory Visit (INDEPENDENT_AMBULATORY_CARE_PROVIDER_SITE_OTHER): Payer: BC Managed Care – PPO | Admitting: Family Medicine

## 2013-02-13 VITALS — BP 134/82 | HR 77 | Resp 16 | Ht 73.0 in | Wt 180.1 lb

## 2013-02-13 DIAGNOSIS — R221 Localized swelling, mass and lump, neck: Secondary | ICD-10-CM

## 2013-02-13 DIAGNOSIS — M25561 Pain in right knee: Secondary | ICD-10-CM

## 2013-02-13 DIAGNOSIS — G5600 Carpal tunnel syndrome, unspecified upper limb: Secondary | ICD-10-CM

## 2013-02-13 DIAGNOSIS — D696 Thrombocytopenia, unspecified: Secondary | ICD-10-CM

## 2013-02-13 DIAGNOSIS — I10 Essential (primary) hypertension: Secondary | ICD-10-CM

## 2013-02-13 DIAGNOSIS — M25569 Pain in unspecified knee: Secondary | ICD-10-CM

## 2013-02-13 DIAGNOSIS — J309 Allergic rhinitis, unspecified: Secondary | ICD-10-CM

## 2013-02-13 DIAGNOSIS — D72819 Decreased white blood cell count, unspecified: Secondary | ICD-10-CM

## 2013-02-13 DIAGNOSIS — N63 Unspecified lump in unspecified breast: Secondary | ICD-10-CM

## 2013-02-13 DIAGNOSIS — R220 Localized swelling, mass and lump, head: Secondary | ICD-10-CM

## 2013-02-13 MED ORDER — ENALAPRIL MALEATE 5 MG PO TABS: ORAL_TABLET | ORAL | Status: DC

## 2013-02-13 NOTE — Progress Notes (Signed)
  Subjective:    Patient ID: Mark Sandoval, male    DOB: May 03, 1950, 62 y.o.   MRN: [id number]  HPI The PT is here for follow up and re-evaluation of chronic medical conditions, medication management and review of any available recent lab and radiology data.  Preventive health is updated, specifically  Cancer screening and Immunization.   Has upcoming appointment with urology , which is a bit later than usual. He has no urologic complaints/concerns. Unsure if stool testing for hidden blood is done by urology, I have advised if not, he may collect stool cards , or have rectal in the office here, as an annual colon screen is recommended. He understands and states will follow through, I have in the past had the same discussion The PT denies any adverse reactions to current medications since the last visit.  There are no new concerns.  States allergies have not flared this year at all. Still no regular exercise, denies abdominal pain, and continues to have an excellent diet, his cholesterol has improved tpo  Normal , which is excellent    Review of Systems See HPI Denies recent fever or chills. Denies sinus pressure, nasal congestion, ear pain or sore throat. Denies chest congestion, productive cough or wheezing. Denies chest pains, palpitations and leg swelling Denies abdominal pain, nausea, vomiting,diarrhea or constipation.   Denies dysuria, frequency, hesitancy or incontinence. Right knee pain and carpal tunnel symptoms persist , but are tolerable Denies headaches, seizures, numbness, or tingling. Denies depression, anxiety or insomnia. Denies skin break down or rash.        Objective:   Physical Exam Patient alert and oriented and in no cardiopulmonary distress.  HEENT: No facial asymmetry, EOMI, no sinus tenderness,  oropharynx pink and moist.  Neck supple no adenopathy.  Chest: Clear to auscultation bilaterally.  CVS: S1, S2 no murmurs, no S3.  ABD: Soft non tender.  Bowel sounds normal.  Ext: No edema  MS: Adequate ROM spine, shoulders, hips and knees.  Skin: Intact, no ulcerations or rash noted.  Psych: Good eye contact, normal affect. Memory intact not anxious or depressed appearing.  CNS: CN 2-12 intact, power, normal throughout.        Assessment & Plan:

## 2013-02-13 NOTE — Patient Instructions (Signed)
F/U in 6 month, call if  You need me before  Congrats on excellent labs  And blood pressure   No med changes  Lab sheets will be mailed  Enalapril refill for 1 year

## 2013-02-14 DIAGNOSIS — G5600 Carpal tunnel syndrome, unspecified upper limb: Secondary | ICD-10-CM | POA: Insufficient documentation

## 2013-02-14 NOTE — Assessment & Plan Note (Signed)
Stable no flares this year, has not need to use med

## 2013-02-14 NOTE — Assessment & Plan Note (Signed)
Unchanged, no plan on intervention at this time

## 2013-02-14 NOTE — Assessment & Plan Note (Signed)
rept CBC stat in 6 month

## 2013-02-14 NOTE — Assessment & Plan Note (Addendum)
rept stat cbc in 6 month, has been evaluated by hematology  locally  within the past 5 years

## 2013-02-14 NOTE — Assessment & Plan Note (Signed)
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

## 2013-02-14 NOTE — Assessment & Plan Note (Signed)
Controlled, no change in medication DASH diet and commitment to daily physical activity for a minimum of 30 minutes discussed and encouraged, as a part of hypertension management. The importance of attaining a healthy weight is also discussed.  

## 2013-07-27 ENCOUNTER — Other Ambulatory Visit: Payer: Self-pay

## 2013-07-27 MED ORDER — AZELASTINE HCL 0.1 % NA SOLN: NASAL | Status: DC

## 2013-08-14 ENCOUNTER — Ambulatory Visit: Payer: BC Managed Care – PPO | Admitting: Family Medicine

## 2013-08-14 ENCOUNTER — Other Ambulatory Visit: Payer: Self-pay

## 2013-08-14 DIAGNOSIS — I10 Essential (primary) hypertension: Secondary | ICD-10-CM

## 2013-08-14 DIAGNOSIS — D72819 Decreased white blood cell count, unspecified: Secondary | ICD-10-CM

## 2013-08-19 ENCOUNTER — Other Ambulatory Visit: Payer: Self-pay

## 2013-08-19 DIAGNOSIS — I10 Essential (primary) hypertension: Secondary | ICD-10-CM

## 2013-08-19 LAB — CBC WITH DIFFERENTIAL/PLATELET
Basophils Absolute: 0 10*3/uL (ref 0.0–0.1)
Basophils Relative: 0 % (ref 0–1)
Eosinophils Absolute: 0.1 10*3/uL (ref 0.0–0.7)
Eosinophils Relative: 4 % (ref 0–5)
HEMATOCRIT: 38.7 % — AB (ref 39.0–52.0)
HEMOGLOBIN: 13.1 g/dL (ref 13.0–17.0)
LYMPHS ABS: 1.2 10*3/uL (ref 0.7–4.0)
LYMPHS PCT: 33 % (ref 12–46)
MCH: 22.4 pg — ABNORMAL LOW (ref 26.0–34.0)
MCHC: 33.9 g/dL (ref 30.0–36.0)
MCV: 66.2 fL — ABNORMAL LOW (ref 78.0–100.0)
MONO ABS: 0.3 10*3/uL (ref 0.1–1.0)
Monocytes Relative: 8 % (ref 3–12)
Neutro Abs: 2 10*3/uL (ref 1.7–7.7)
Neutrophils Relative %: 55 % (ref 43–77)
Platelets: 131 10*3/uL — ABNORMAL LOW (ref 150–400)
RBC: 5.85 MIL/uL — AB (ref 4.22–5.81)
RDW: 16.2 % — ABNORMAL HIGH (ref 11.5–15.5)
WBC: 3.7 10*3/uL — AB (ref 4.0–10.5)

## 2013-08-19 LAB — BASIC METABOLIC PANEL
BUN: 17 mg/dL (ref 6–23)
CHLORIDE: 102 meq/L (ref 96–112)
CO2: 29 meq/L (ref 19–32)
CREATININE: 1.34 mg/dL (ref 0.50–1.35)
Calcium: 8.9 mg/dL (ref 8.4–10.5)
Glucose, Bld: 90 mg/dL (ref 70–99)
Potassium: 4.5 mEq/L (ref 3.5–5.3)
Sodium: 141 mEq/L (ref 135–145)

## 2013-08-20 LAB — TSH: TSH: 2.69 u[IU]/mL (ref 0.350–4.500)

## 2013-08-21 ENCOUNTER — Encounter: Payer: Self-pay | Admitting: Family Medicine

## 2013-08-21 ENCOUNTER — Encounter (INDEPENDENT_AMBULATORY_CARE_PROVIDER_SITE_OTHER): Payer: Self-pay

## 2013-08-21 ENCOUNTER — Ambulatory Visit (INDEPENDENT_AMBULATORY_CARE_PROVIDER_SITE_OTHER): Payer: BC Managed Care – PPO | Admitting: Family Medicine

## 2013-08-21 VITALS — BP 130/82 | HR 70 | Resp 16 | Wt 184.1 lb

## 2013-08-21 DIAGNOSIS — I10 Essential (primary) hypertension: Secondary | ICD-10-CM

## 2013-08-21 DIAGNOSIS — D72819 Decreased white blood cell count, unspecified: Secondary | ICD-10-CM

## 2013-08-21 DIAGNOSIS — Z79899 Other long term (current) drug therapy: Secondary | ICD-10-CM

## 2013-08-21 NOTE — Patient Instructions (Signed)
F/u early November , call if you need me before  Flu vaccine should be available ibn September, pls call to get one  Please collect as instructed , and return the 3 stool cards for testing for hidden blood, this is the colon cancer screen done anualy in the absence of any symptoms.  You are doing well, except that you really need to start regular physical activity, to improve your health and longevity   Labs are excellent.  CBC as a stat in early November, fasting lipid, chem 7 in early November

## 2013-08-21 NOTE — Assessment & Plan Note (Signed)
Controlled, no change in medication DASH diet and commitment to daily physical activity for a minimum of 30 minutes discussed and encouraged, as a part of hypertension management. The importance of attaining a healthy weight is also discussed.  

## 2013-08-21 NOTE — Progress Notes (Signed)
   Subjective:    Patient ID: Mark Sandoval, male    DOB: 03-28-1951, 63 y.o.   MRN: [id number]  HPI The PT is here for follow up and re-evaluation of chronic medical conditions, medication management and review of any available recent lab and radiology data.  Preventive health is updated, specifically  Cancer screening and Immunization.   [facility] appt in the Fall, he is given stool cards to return for colon screening The PT denies any adverse reactions to current medications since the last visit.  There are no new concerns.  There are no specific complaints       Review of Systems See HPI Denies recent fever or chills. Denies sinus pressure, nasal congestion, ear pain or sore throat. Denies chest congestion, productive cough or wheezing. Denies chest pains, palpitations and leg swelling Denies abdominal pain, nausea, vomiting,diarrhea or constipation. No change in bM  Denies dysuria, frequency, hesitancy or incontinence. Denies uncontrolled joint pain, swelling and limitation in mobility.Chronic carpal tunnel and left knee pain, works with and through them Denies headaches, seizures, numbness, or tingling. Denies depression, anxiety or insomnia. Denies skin break down or rash.c/o h/o lipomas, has been a problem over the years, no interest in intervention at this time        Objective:   Physical Exam  BP 130/82  Pulse 70  Resp 16  Wt 184 lb 1.9 oz (83.516 kg)  SpO2 100% Patient alert and oriented and in no cardiopulmonary distress.  HEENT: No facial asymmetry, EOMI, no sinus tenderness,  oropharynx pink and moist.  Neck supple no adenopathy.  Chest: Clear to auscultation bilaterally.  CVS: S1, S2 no murmurs, no S3.  ABD: Soft non tender. Bowel sounds normal.  Ext: No edema  MS: Adequate ROM spine, shoulders, hips and knees.  Skin: Intact, no ulcerations or rash noted.  Psych: Good eye contact, normal affect. Memory intact not anxious or  depressed appearing.  CNS: CN 2-12 intact, power, tone and sensation normal throughout.       Assessment & Plan:  HYPERTENSION Controlled, no change in medication DASH diet and commitment to daily physical activity for a minimum of 30 minutes discussed and encouraged, as a part of hypertension management. The importance of attaining a healthy weight is also discussed.   LEUKOPENIA, CHRONIC Stable, will rept test in Fall

## 2013-08-23 NOTE — Assessment & Plan Note (Signed)
Stable, will rept test in Fall

## 2013-09-22 ENCOUNTER — Other Ambulatory Visit (INDEPENDENT_AMBULATORY_CARE_PROVIDER_SITE_OTHER): Payer: Self-pay | Admitting: Internal Medicine

## 2013-09-22 NOTE — Telephone Encounter (Signed)
Per Dr.Rehman may fill with 5 additional refills. 

## 2013-10-22 ENCOUNTER — Ambulatory Visit (INDEPENDENT_AMBULATORY_CARE_PROVIDER_SITE_OTHER): Payer: BC Managed Care – PPO | Admitting: Family Medicine

## 2013-10-22 ENCOUNTER — Encounter (INDEPENDENT_AMBULATORY_CARE_PROVIDER_SITE_OTHER): Payer: Self-pay

## 2013-10-22 ENCOUNTER — Encounter: Payer: Self-pay | Admitting: Family Medicine

## 2013-10-22 VITALS — BP 132/84 | HR 82 | Resp 16 | Wt 187.4 lb

## 2013-10-22 DIAGNOSIS — I10 Essential (primary) hypertension: Secondary | ICD-10-CM

## 2013-10-22 DIAGNOSIS — J040 Acute laryngitis: Secondary | ICD-10-CM

## 2013-10-22 MED ORDER — AZITHROMYCIN 250 MG PO TABS: ORAL_TABLET | ORAL | Status: DC

## 2013-10-22 MED ORDER — PREDNISONE 5 MG PO TABS: 5.0000 mg | ORAL_TABLET | Freq: Two times a day (BID) | ORAL | Status: AC

## 2013-10-22 NOTE — Progress Notes (Signed)
   Subjective:    Patient ID: Mark Sandoval, male    DOB: 08/31/1950, 63 y.o.   MRN: [id number]  HPI 4  day h/o acute loss of voice, no other symptoms, positive exposure to coughing otherwise well infant, pt denies any sym[ptoms suggestive of bacterial infection, sttaes the voice is improved since yesterday Has practiced voice rest. Denies symptoms of uncontrolled reflux   Review of Systems See HPI Denies recent fever or chills. Denies sinus pressure, nasal congestion, ear pain or sore throat. Denies chest congestion, productive cough or wheezing. Denies chest pains, palpitations and leg swelling Denies abdominal pain, nausea, vomiting,diarrhea or constipation.   Denies joint pain, swelling and limitation in mobility. Denies headaches,         Objective:   Physical Exam  BP 132/84  Pulse 82  Resp 16  Wt 187 lb 6.4 oz (85.004 kg)  SpO2 99% Patient alert and oriented and in no cardiopulmonary distress.Well appearing  HEENT: No facial asymmetry, EOMI,   oropharynx pink and moist.  Neck supple no JVD, no mass. Oropharynx not erythematous , no exudate. TM clear bilaterally Chest: Clear to auscultation bilaterally.  CVS: S1, S2 no murmurs, no S3.Regular rate.  ABD: Soft non tender.   Ext: No edema  MS: Adequate ROM spine, shoulders, hips and knees.  .  CNS: CN 2-12 intact, power,  normal throughout.no focal deficits noted.       Assessment & Plan:  Acute laryngitis Acute viral laryngitis , which is improving. No symptom or sign of bacterial infection Supportive management only, lso prednisone fo  Days only Pt given z pack to hold onto in case symptoms worsen with development of fever , chills , green sputum or nasal drainage, he understands clearly to fill the script only in the event of superinfection He is to call if worsens or no better in the next 3 to 5 days  HYPERTENSION Controlled, no change in medication DASH diet and commitment to daily physical  activity for a minimum of 30 minutes discussed and encouraged, as a part of hypertension management. The importance of attaining a healthy weight is also discussed.

## 2013-10-22 NOTE — Patient Instructions (Signed)
F/u as before  You have acute laryngitis, which I believe is viral  Prednisone for 5 days as prescribed, and voice rest  IF you develop fever , chills, green sputum or sinus drainage fill azithromycin please, I do nOT anticipate this will be necessary  Call next week if not better, I will refer you to ENT   Laryngitis Laryngitis is redness, soreness, and puffiness (inflammation) of the vocal cords. It causes hoarseness, cough, loss of voice, sore throat, and dry throat. It may be caused by:  Infection.  Too much smoking.  Too much talking or yelling.  Breathing in of toxic fumes.  Allergies.  A backup of acid from your stomach. HOME CARE  Drink enough fluids to keep your pee (urine) clear or pale yellow.  Rest until you no longer have problems or as told by your doctor.  Breathe in moist air.  Take all medicine as told by your doctor.  Do not smoke.  Talk as little as possible (this includes whispering).  Write on paper instead of talking until your voice is back to normal.  Follow up with your doctor if you have not improved after 10 days. GET HELP IF:   You have trouble breathing.  You cough up blood.  You have a fever that will not go away.  You have increasing pain.  You have trouble swallowing. MAKE SURE YOU:  Understand these instructions.  Will watch your condition.  Will get help right away if you are not doing well or get worse. Document Released: 03/22/2011 Document Revised: 06/25/2011 Document Reviewed: 03/22/2011 [employer] Patient Information 2015 [place], [place]. This information is not intended to replace advice given to you by your health care provider. Make sure you discuss any questions you have with your health care provider.

## 2013-10-24 NOTE — Assessment & Plan Note (Signed)
Acute viral laryngitis , which is improving. No symptom or sign of bacterial infection Supportive management only, lso prednisone fo  Days only Pt given z pack to hold onto in case symptoms worsen with development of fever , chills , green sputum or nasal drainage, he understands clearly to fill the script only in the event of superinfection He is to call if worsens or no better in the next 3 to 5 days

## 2013-10-24 NOTE — Assessment & Plan Note (Signed)
Controlled, no change in medication DASH diet and commitment to daily physical activity for a minimum of 30 minutes discussed and encouraged, as a part of hypertension management. The importance of attaining a healthy weight is also discussed.  

## 2014-01-04 ENCOUNTER — Telehealth: Payer: Self-pay | Admitting: Family Medicine

## 2014-01-04 NOTE — Telephone Encounter (Signed)
If someone cancels, please add to schedule.

## 2014-01-06 NOTE — Telephone Encounter (Signed)
Noted  

## 2014-01-11 ENCOUNTER — Other Ambulatory Visit: Payer: Self-pay | Admitting: Family Medicine

## 2014-01-11 ENCOUNTER — Telehealth: Payer: Self-pay | Admitting: Family Medicine

## 2014-01-11 DIAGNOSIS — Z1321 Encounter for screening for nutritional disorder: Secondary | ICD-10-CM

## 2014-01-11 DIAGNOSIS — E785 Hyperlipidemia, unspecified: Secondary | ICD-10-CM

## 2014-01-11 DIAGNOSIS — I10 Essential (primary) hypertension: Secondary | ICD-10-CM

## 2014-01-11 NOTE — Telephone Encounter (Signed)
Patient aware, scheduled and faxed lab order to lab

## 2014-01-11 NOTE — Telephone Encounter (Signed)
pls order CBC and diff as a stat,  Order fasting chem 7 , lipid, tSH  And vit d  Pls  call and let pt  Know to  Get labs asap. Pls also document after questioning if he has fever, chills, chest pain etc so more info can be obtained, let me know He should have an appt in the next 1 to 2 weeks pls sched if none

## 2014-01-11 NOTE — Addendum Note (Signed)
Addended by: Eual Fines on: 01/11/2014 10:17 AM   Modules accepted: Orders

## 2014-01-12 LAB — CBC WITH DIFFERENTIAL/PLATELET
BASOS ABS: 0 10*3/uL (ref 0.0–0.1)
Basophils Relative: 0 % (ref 0–1)
Eosinophils Absolute: 0.1 10*3/uL (ref 0.0–0.7)
Eosinophils Relative: 3 % (ref 0–5)
HCT: 39.1 % (ref 39.0–52.0)
Hemoglobin: 13.4 g/dL (ref 13.0–17.0)
LYMPHS ABS: 1.5 10*3/uL (ref 0.7–4.0)
Lymphocytes Relative: 43 % (ref 12–46)
MCH: 22.9 pg — ABNORMAL LOW (ref 26.0–34.0)
MCHC: 34.3 g/dL (ref 30.0–36.0)
MCV: 66.7 fL — ABNORMAL LOW (ref 78.0–100.0)
Monocytes Absolute: 0.2 10*3/uL (ref 0.1–1.0)
Monocytes Relative: 6 % (ref 3–12)
NEUTROS ABS: 1.6 10*3/uL — AB (ref 1.7–7.7)
Neutrophils Relative %: 48 % (ref 43–77)
Platelets: 139 10*3/uL — ABNORMAL LOW (ref 150–400)
RBC: 5.86 MIL/uL — ABNORMAL HIGH (ref 4.22–5.81)
RDW: 15.7 % — AB (ref 11.5–15.5)
WBC: 3.4 10*3/uL — AB (ref 4.0–10.5)

## 2014-01-12 LAB — LIPID PANEL
CHOL/HDL RATIO: 3.2 ratio
Cholesterol: 172 mg/dL (ref 0–200)
HDL: 53 mg/dL (ref >39)
LDL Cholesterol: 105 mg/dL — ABNORMAL HIGH (ref 0–99)
TRIGLYCERIDES: 68 mg/dL (ref <150)
VLDL: 14 mg/dL (ref 0–40)

## 2014-01-12 LAB — TSH: TSH: 2.724 u[IU]/mL (ref 0.350–4.500)

## 2014-01-13 LAB — VITAMIN D 25 HYDROXY (VIT D DEFICIENCY, FRACTURES): Vit D, 25-Hydroxy: 36 ng/mL (ref 30–89)

## 2014-01-13 NOTE — Telephone Encounter (Signed)
Patient has an appointment for 10.2.2015 at 8:00

## 2014-01-15 ENCOUNTER — Other Ambulatory Visit: Payer: Self-pay | Admitting: Family Medicine

## 2014-01-15 ENCOUNTER — Encounter (INDEPENDENT_AMBULATORY_CARE_PROVIDER_SITE_OTHER): Payer: Self-pay

## 2014-01-15 ENCOUNTER — Encounter: Payer: Self-pay | Admitting: Family Medicine

## 2014-01-15 ENCOUNTER — Ambulatory Visit (INDEPENDENT_AMBULATORY_CARE_PROVIDER_SITE_OTHER): Payer: BC Managed Care – PPO | Admitting: Family Medicine

## 2014-01-15 ENCOUNTER — Telehealth: Payer: Self-pay | Admitting: Family Medicine

## 2014-01-15 VITALS — BP 148/90 | HR 80 | Resp 16 | Ht 73.0 in | Wt 187.0 lb

## 2014-01-15 DIAGNOSIS — Z23 Encounter for immunization: Secondary | ICD-10-CM

## 2014-01-15 DIAGNOSIS — E785 Hyperlipidemia, unspecified: Secondary | ICD-10-CM | POA: Insufficient documentation

## 2014-01-15 DIAGNOSIS — I10 Essential (primary) hypertension: Secondary | ICD-10-CM

## 2014-01-15 DIAGNOSIS — R42 Dizziness and giddiness: Secondary | ICD-10-CM

## 2014-01-15 LAB — BASIC METABOLIC PANEL
BUN: 18 mg/dL (ref 6–23)
CHLORIDE: 104 meq/L (ref 96–112)
CO2: 24 mEq/L (ref 19–32)
Calcium: 9.1 mg/dL (ref 8.4–10.5)
Creat: 1.48 mg/dL — ABNORMAL HIGH (ref 0.50–1.35)
GLUCOSE: 88 mg/dL (ref 70–99)
POTASSIUM: 4.3 meq/L (ref 3.5–5.3)
Sodium: 139 mEq/L (ref 135–145)

## 2014-01-15 MED ORDER — AMLODIPINE BESYLATE 2.5 MG PO TABS: ORAL_TABLET | ORAL | Status: DC

## 2014-01-15 MED ORDER — ENALAPRIL MALEATE 10 MG PO TABS: 10.0000 mg | ORAL_TABLET | Freq: Every day | ORAL | Status: DC
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
## 2014-01-15 NOTE — Telephone Encounter (Signed)
Spoke with pt's wife, who understands th e need to d/c the previous BP med effective 10/04.2015, this is because his creatinine level has been consistently mildly abnormal for years. I have decided to change him to amlodipine 2.5 mg daily, and he will start this on 01/17/2014 He has been advised to contact me with any questions
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
## 2014-01-15 NOTE — Assessment & Plan Note (Addendum)
Uncontrolled,increase vasotec to 10mg  daily DASH diet and commitment to daily physical activity for a minimum of 30 minutes discussed and encouraged, as a part of hypertension management. The importance of attaining a healthy weight is also discussed.

## 2014-01-15 NOTE — Assessment & Plan Note (Signed)
Elevated LDL in pt with history concerning for possible  CVD. Low fat diet ed done, cheese  Seems to be the biggest cul[prit, and he will cut back on this  He will commit to daily aspirin 81mg   Statin therapy not offered pending neurology eval and recommendations.

## 2014-01-15 NOTE — Assessment & Plan Note (Signed)
Vaccine administered at visit.  

## 2014-01-15 NOTE — Patient Instructions (Signed)
Nurse BP check in 4 weeks non fast chem 7 in 4 weeks  MD f/u in December  Increase in dose of vasotec to 10mg  daily, oK to take TWO 5mg  tablets once daily until done  EKG today shows normal rhythm with no evidence of enlargement of the heart or previous heart damage  You are referred to [facility] neurology for further evaluation  BP is elevated  Bad cholesterol is higher than should be need to reduce cheese, butter, fried and fatty foods and red meat  You really DO NEED to work on lifestyle changes to enable you to get sufficient good rest and daily physical activity  Flu vac today

## 2014-01-16 ENCOUNTER — Telehealth: Payer: Self-pay | Admitting: Family Medicine

## 2014-01-16 NOTE — Telephone Encounter (Signed)
Pls mail to pt , info as to how to sign up for "my chart" I toild him to expect this. Pls also send NEW lab request state specifically iun writin to pt 'Non fast" chem 7 and EGFR (I am spevcifically following his creatinine with mewd change and better BP control) Let him know to discard the lab request he was given duering recent OV pls  ???, pls ask

## 2014-01-18 ENCOUNTER — Other Ambulatory Visit: Payer: Self-pay

## 2014-01-18 DIAGNOSIS — I10 Essential (primary) hypertension: Secondary | ICD-10-CM

## 2014-01-18 NOTE — Telephone Encounter (Signed)
Patient aware and info mailed

## 2014-01-21 ENCOUNTER — Encounter: Payer: Self-pay | Admitting: Family Medicine

## 2014-02-12 ENCOUNTER — Ambulatory Visit: Payer: BC Managed Care – PPO

## 2014-02-12 VITALS — BP 142/92

## 2014-02-12 DIAGNOSIS — I10 Essential (primary) hypertension: Secondary | ICD-10-CM

## 2014-02-12 MED ORDER — AMLODIPINE BESYLATE 5 MG PO TABS: 5.0000 mg | ORAL_TABLET | Freq: Every day | ORAL | Status: DC

## 2014-02-12 NOTE — Progress Notes (Signed)
Patient in for nurse visit blood pressure check.   Blood pressure checked manually in left arm by both nurse and physician.  Adjustment in Amlodipine made.

## 2014-02-18 ENCOUNTER — Other Ambulatory Visit: Payer: Self-pay | Admitting: Family Medicine

## 2014-02-18 LAB — COMPLETE METABOLIC PANEL WITH GFR
ALT: 15 U/L (ref 0–53)
AST: 20 U/L (ref 0–37)
Albumin: 4.3 g/dL (ref 3.5–5.2)
Alkaline Phosphatase: 76 U/L (ref 39–117)
BILIRUBIN TOTAL: 0.4 mg/dL (ref 0.2–1.2)
BUN: 17 mg/dL (ref 6–23)
CO2: 28 mEq/L (ref 19–32)
Calcium: 8.9 mg/dL (ref 8.4–10.5)
Chloride: 102 mEq/L (ref 96–112)
Creat: 1.32 mg/dL (ref 0.50–1.35)
GFR, EST NON AFRICAN AMERICAN: 57 mL/min — AB
GFR, Est African American: 66 mL/min
Glucose, Bld: 85 mg/dL (ref 70–99)
Potassium: 4.2 mEq/L (ref 3.5–5.3)
Sodium: 138 mEq/L (ref 135–145)
Total Protein: 6.6 g/dL (ref 6.0–8.3)

## 2014-02-19 ENCOUNTER — Ambulatory Visit: Payer: BC Managed Care – PPO | Admitting: Family Medicine

## 2014-04-02 ENCOUNTER — Encounter: Payer: Self-pay | Admitting: Family Medicine

## 2014-04-02 ENCOUNTER — Ambulatory Visit (INDEPENDENT_AMBULATORY_CARE_PROVIDER_SITE_OTHER): Payer: BC Managed Care – PPO | Admitting: Family Medicine

## 2014-04-02 VITALS — BP 124/80 | HR 77 | Resp 16 | Ht 73.0 in | Wt 192.0 lb

## 2014-04-02 DIAGNOSIS — D696 Thrombocytopenia, unspecified: Secondary | ICD-10-CM

## 2014-04-02 DIAGNOSIS — E663 Overweight: Secondary | ICD-10-CM | POA: Insufficient documentation

## 2014-04-02 DIAGNOSIS — E785 Hyperlipidemia, unspecified: Secondary | ICD-10-CM

## 2014-04-02 DIAGNOSIS — I10 Essential (primary) hypertension: Secondary | ICD-10-CM

## 2014-04-02 NOTE — Progress Notes (Signed)
   Subjective:    Patient ID: Mark Sandoval, male    DOB: 17-Jul-1950, 63 y.o.   MRN: [id number]  HPI  The PT is here for follow up and re-evaluation of chronic medical conditions, medication management and review of any available recent lab and radiology data.  Preventive health is updated, specifically  Cancer screening and Immunization.   Still awaiting appt at [facility], has been put off twice. The PT denies any adverse reactions to current medications since the last visit.  There are no new concerns.  There are no specific complaints      Review of Systems See HPI Denies recent fever or chills. Denies sinus pressure, nasal congestion, ear pain or sore throat. Denies chest congestion, productive cough or wheezing. Denies chest pains, palpitations and leg swelling Denies abdominal pain, nausea, vomiting,diarrhea or constipation.   Denies dysuria, frequency, hesitancy or incontinence. Denies joint pain, swelling and limitation in mobility. Denies headaches, seizures, numbness, or tingling. Denies depression, anxiety or insomnia. Denies skin break down or rash.        Objective:   Physical Exam BP 124/80 mmHg  Pulse 77  Resp 16  Ht 6\' 1"  (1.854 m)  Wt 192 lb (87.091 kg)  BMI 25.34 kg/m2  SpO2 99% Patient alert and oriented and in no cardiopulmonary distress.  HEENT: No facial asymmetry, EOMI,   oropharynx pink and moist.  Neck supple no JVD, no mass.  Chest: Clear to auscultation bilaterally.  CVS: S1, S2 no murmurs, no S3.Regular rate.  ABD: Soft non tender.   Ext: No edema  MS: Adequate ROM spine, shoulders, hips and knees.  Skin: Intact, no ulcerations or rash noted.  Psych: Good eye contact, normal affect. Memory intact not anxious or depressed appearing.  CNS: CN 2-12 intact, power,  normal throughout.no focal deficits noted.        Assessment & Plan:  Essential hypertension Controlled, no change in medication DASH diet and commitment to daily  physical activity for a minimum of 30 minutes discussed and encouraged, as a part of hypertension management. The importance of attaining a healthy weight is also discussed.   Dyslipidemia, goal LDL below 100 Hyperlipidemia:Low fat diet discussed and encouraged.  Updated lab needed at/ before next visit.   Overweight (BMI 25.0-29.9) Actively engaged in commitment to healthy weight through daily exercise for past 3 weeks, goal weight iof pt is 175 pounds  He is congratulated on new resolve

## 2014-04-02 NOTE — Assessment & Plan Note (Signed)
Controlled, no change in medication DASH diet and commitment to daily physical activity for a minimum of 30 minutes discussed and encouraged, as a part of hypertension management. The importance of attaining a healthy weight is also discussed.  

## 2014-04-02 NOTE — Assessment & Plan Note (Signed)
Actively engaged in commitment to healthy weight through daily exercise for past 3 weeks, goal weight iof pt is 175 pounds  He is congratulated on new resolve

## 2014-04-02 NOTE — Assessment & Plan Note (Signed)
Hyperlipidemia:Low fat diet discussed and encouraged.  Updated lab needed at/ before next visit.  

## 2014-04-02 NOTE — Patient Instructions (Signed)
F/u in 5 month, call if you need me before  Continue great health habits, blood pressure is excellent  Fasting chem 7 EGFr and lipid in 5 month also cBc stat  Lab order will be mailed

## 2014-06-08 ENCOUNTER — Other Ambulatory Visit: Payer: Self-pay | Admitting: Family Medicine

## 2014-08-12 ENCOUNTER — Ambulatory Visit (INDEPENDENT_AMBULATORY_CARE_PROVIDER_SITE_OTHER): Payer: BLUE CROSS/BLUE SHIELD | Admitting: Family Medicine

## 2014-08-12 ENCOUNTER — Encounter: Payer: Self-pay | Admitting: Family Medicine

## 2014-08-12 VITALS — BP 126/64 | HR 88 | Resp 18 | Ht 73.0 in | Wt 189.1 lb

## 2014-08-12 DIAGNOSIS — G8929 Other chronic pain: Secondary | ICD-10-CM

## 2014-08-12 DIAGNOSIS — R1031 Right lower quadrant pain: Secondary | ICD-10-CM

## 2014-08-12 DIAGNOSIS — Z1211 Encounter for screening for malignant neoplasm of colon: Secondary | ICD-10-CM | POA: Diagnosis not present

## 2014-08-12 DIAGNOSIS — K529 Noninfective gastroenteritis and colitis, unspecified: Secondary | ICD-10-CM | POA: Diagnosis not present

## 2014-08-12 DIAGNOSIS — D696 Thrombocytopenia, unspecified: Secondary | ICD-10-CM

## 2014-08-12 DIAGNOSIS — D72819 Decreased white blood cell count, unspecified: Secondary | ICD-10-CM

## 2014-08-12 DIAGNOSIS — I10 Essential (primary) hypertension: Secondary | ICD-10-CM

## 2014-08-12 LAB — BASIC METABOLIC PANEL
BUN: 18 mg/dL (ref 6–23)
CALCIUM: 9 mg/dL (ref 8.4–10.5)
CO2: 29 mEq/L (ref 19–32)
Chloride: 103 mEq/L (ref 96–112)
Creat: 1.42 mg/dL — ABNORMAL HIGH (ref 0.50–1.35)
GLUCOSE: 87 mg/dL (ref 70–99)
Potassium: 4.7 mEq/L (ref 3.5–5.3)
Sodium: 139 mEq/L (ref 135–145)

## 2014-08-12 LAB — CBC WITH DIFFERENTIAL/PLATELET
BASOS ABS: 0 10*3/uL (ref 0.0–0.1)
Basophils Relative: 0 % (ref 0–1)
EOS ABS: 0 10*3/uL (ref 0.0–0.7)
Eosinophils Relative: 0 % (ref 0–5)
HEMATOCRIT: 42.7 % (ref 39.0–52.0)
Hemoglobin: 14.2 g/dL (ref 13.0–17.0)
LYMPHS PCT: 27 % (ref 12–46)
Lymphs Abs: 0.7 10*3/uL (ref 0.7–4.0)
MCH: 22.6 pg — ABNORMAL LOW (ref 26.0–34.0)
MCHC: 33.3 g/dL (ref 30.0–36.0)
MCV: 68 fL — AB (ref 78.0–100.0)
Monocytes Absolute: 0.2 10*3/uL (ref 0.1–1.0)
Monocytes Relative: 7 % (ref 3–12)
Neutro Abs: 1.8 10*3/uL (ref 1.7–7.7)
Neutrophils Relative %: 66 % (ref 43–77)
Platelets: 124 10*3/uL — ABNORMAL LOW (ref 150–400)
RBC: 6.28 MIL/uL — ABNORMAL HIGH (ref 4.22–5.81)
RDW: 15.5 % (ref 11.5–15.5)
WBC: 2.7 10*3/uL — ABNORMAL LOW (ref 4.0–10.5)

## 2014-08-12 LAB — POC HEMOCCULT BLD/STL (OFFICE/1-CARD/DIAGNOSTIC): Fecal Occult Blood, POC: NEGATIVE

## 2014-08-12 LAB — HEPATIC FUNCTION PANEL
ALT: 33 U/L (ref 0–53)
AST: 30 U/L (ref 0–37)
Albumin: 4.2 g/dL (ref 3.5–5.2)
Alkaline Phosphatase: 88 U/L (ref 39–117)
BILIRUBIN INDIRECT: 0.4 mg/dL (ref 0.2–1.2)
Bilirubin, Direct: 0.1 mg/dL (ref 0.0–0.3)
Total Bilirubin: 0.5 mg/dL (ref 0.2–1.2)
Total Protein: 6.5 g/dL (ref 6.0–8.3)

## 2014-08-12 LAB — AMYLASE: AMYLASE: 76 U/L (ref 0–105)

## 2014-08-12 LAB — LIPASE: Lipase: 23 U/L (ref 0–75)

## 2014-08-12 MED ORDER — DIPHENOXYLATE-ATROPINE 2.5-0.025 MG PO TABS: 1.0000 | ORAL_TABLET | Freq: Four times a day (QID) | ORAL | Status: DC | PRN

## 2014-08-12 MED ORDER — ONDANSETRON 4 MG PO TBDP: 4.0000 mg | ORAL_TABLET | Freq: Three times a day (TID) | ORAL | Status: DC | PRN

## 2014-08-12 MED ORDER — ONDANSETRON HCL 4 MG/2ML IJ SOLN
4.0000 mg | Freq: Once | INTRAMUSCULAR | Status: AC
  Administered 2014-08-12: 4 mg via INTRAMUSCULAR

## 2014-08-12 NOTE — Patient Instructions (Signed)
F/u as before, call if you need me soonner  You are treated for acute gastroenteritis   Zofran injection in office today  No hidden blood on stool exam today, which is good  Zofran and lomotil are sent to your pharmacy for symptomatic treatment, use as we discussed  Concentrate on ensuring adequate fluid intake, clinically you are not dehydrated    You ar referred to Dr Wonda Amis re chronic abdominal pain for further evaluation  Stat CBC and diff and chem 7 , amylase and lipase, hepatic panel stat if possible also   Food Choices to Help Relieve Diarrhea When you have diarrhea, the foods you eat and your eating habits are very important. Choosing the right foods and drinks can help relieve diarrhea. Also, because diarrhea can last up to 7 days, you need to replace lost fluids and electrolytes (such as sodium, potassium, and chloride) in order to help prevent dehydration.  WHAT GENERAL GUIDELINES DO I NEED TO FOLLOW?  Slowly drink 1 cup (8 oz) of fluid for each episode of diarrhea. If you are getting enough fluid, your urine will be clear or pale yellow.  Eat starchy foods. Some good choices include white rice, white toast, pasta, low-fiber cereal, baked potatoes (without the skin), saltine crackers, and bagels.  Avoid large servings of any cooked vegetables.  Limit fruit to two servings per day. A serving is  cup or 1 small piece.  Choose foods with less than 2 g of fiber per serving.  Limit fats to less than 8 tsp (38 g) per day.  Avoid fried foods.  Eat foods that have probiotics in them. Probiotics can be found in certain dairy products.  Avoid foods and beverages that may increase the speed at which food moves through the stomach and intestines (gastrointestinal tract). Things to avoid include:  High-fiber foods, such as dried fruit, raw fruits and vegetables, nuts, seeds, and whole grain foods.  Spicy foods and high-fat foods.  Foods and beverages sweetened with  high-fructose corn syrup, honey, or sugar alcohols such as xylitol, sorbitol, and mannitol. WHAT FOODS ARE RECOMMENDED? Grains White rice. White, [place], or pita breads (fresh or toasted), including plain rolls, buns, or bagels. White pasta. Saltine, soda, or graham crackers. Pretzels. Low-fiber cereal. Cooked cereals made with water (such as cornmeal, farina, or cream cereals). Plain muffins. Matzo. Melba toast. Zwieback.  Vegetables Potatoes (without the skin). Strained tomato and vegetable juices. Most well-cooked and canned vegetables without seeds. Tender lettuce. Fruits Cooked or canned applesauce, apricots, cherries, fruit cocktail, grapefruit, peaches, pears, or plums. Fresh bananas, apples without skin, cherries, grapes, cantaloupe, grapefruit, peaches, oranges, or plums.  Meat and Other Protein Products Baked or boiled chicken. Eggs. Tofu. Fish. Seafood. Smooth peanut butter. Ground or well-cooked tender beef, ham, veal, lamb, pork, or poultry.  Dairy Plain yogurt, kefir, and unsweetened liquid yogurt. Lactose-free milk, buttermilk, or soy milk. Plain hard cheese. Beverages Sport drinks. Clear broths. Diluted fruit juices (except prune). Regular, caffeine-free sodas such as ginger ale. Water. Decaffeinated teas. Oral rehydration solutions. Sugar-free beverages not sweetened with sugar alcohols. Other Bouillon, broth, or soups made from recommended foods.  The items listed above may not be a complete list of recommended foods or beverages. Contact your dietitian for more options. WHAT FOODS ARE NOT RECOMMENDED? Grains Whole grain, whole wheat, bran, or rye breads, rolls, pastas, crackers, and cereals. Wild or brown rice. Cereals that contain more than 2 g of fiber per serving. Corn tortillas or taco shells. Cooked or  dry oatmeal. Granola. Popcorn. Vegetables Raw vegetables. Cabbage, broccoli, Brussels sprouts, artichokes, baked beans, beet greens, corn, kale, legumes, peas, sweet  potatoes, and yams. Potato skins. Cooked spinach and cabbage. Fruits Dried fruit, including raisins and dates. Raw fruits. Stewed or dried prunes. Fresh apples with skin, apricots, mangoes, pears, raspberries, and strawberries.  Meat and Other Protein Products Chunky peanut butter. Nuts and seeds. Beans and lentils. Berniece Salines.  Dairy High-fat cheeses. Milk, chocolate milk, and beverages made with milk, such as milk shakes. Cream. Ice cream. Sweets and Desserts Sweet rolls, doughnuts, and sweet breads. Pancakes and waffles. Fats and Oils Butter. Cream sauces. Margarine. Salad oils. Plain salad dressings. Olives. Avocados.  Beverages Caffeinated beverages (such as coffee, tea, soda, or energy drinks). Alcoholic beverages. Fruit juices with pulp. Prune juice. Soft drinks sweetened with high-fructose corn syrup or sugar alcohols. Other Coconut. Hot sauce. Chili powder. Mayonnaise. Gravy. Cream-based or milk-based soups.  The items listed above may not be a complete list of foods and beverages to avoid. Contact your dietitian for more information. WHAT SHOULD I DO IF I BECOME DEHYDRATED? Diarrhea can sometimes lead to dehydration. Signs of dehydration include dark urine and dry mouth and skin. If you think you are dehydrated, you should rehydrate with an oral rehydration solution. These solutions can be purchased at pharmacies, retail stores, or online.  Drink -1 cup (120-240 mL) of oral rehydration solution each time you have an episode of diarrhea. If drinking this amount makes your diarrhea worse, try drinking smaller amounts more often. For example, drink 1-3 tsp (5-15 mL) every 5-10 minutes.  A general rule for staying hydrated is to drink 1-2 L of fluid per day. Talk to your health care provider about the specific amount you should be drinking each day. Drink enough fluids to keep your urine clear or pale yellow. Document Released: 06/23/2003 Document Revised: 04/07/2013 Document Reviewed:  02/23/2013 [facility] Patient Information 2015 [place], [place]. This information is not intended to replace advice given to you by your health care provider. Make sure you discuss any questions you have with your health care provider.  Lipid panel today, not stat

## 2014-08-13 NOTE — Assessment & Plan Note (Signed)
No current flare 

## 2014-08-13 NOTE — Assessment & Plan Note (Signed)
Downward trend in today's lab, will refer back to [facility] hematology for re eval

## 2014-08-13 NOTE — Assessment & Plan Note (Addendum)
Acute onset, symptomatic treatment , out of work x 2 days, zofran 4mg  Im in office Rectal exam : heme negative stool Stat labs, adequately hydrated

## 2014-08-13 NOTE — Assessment & Plan Note (Signed)
Downward trend in most recent test, will refer back to [facility] hematology for re eval

## 2014-08-13 NOTE — Assessment & Plan Note (Signed)
Three year h/o RLQ pain will refer  For GI re eval, colonoscopy due in 2017, option for  Sooner testing will be addressed at the visit

## 2014-08-13 NOTE — Assessment & Plan Note (Signed)
Controlled, no change in medication  

## 2014-08-13 NOTE — Progress Notes (Signed)
   Subjective:    Patient ID: Mark Sandoval, male    DOB: 1950/07/16, 64 y.o.   MRN: [id number]  HPI Pt in with acute GI symptoms over the past 3 days which worsened to the extent that he reports nothing by mouth for past 24 hrs, and vomiting and diarrhea which started overnight. Reports initially brown stool, then black now brown, no blood or mucus in stool, no fever or chills.Feels weak, still voiding urine C/o 3 year h/o LLQ pain daily Otherwise no concerns   Review of Systems See HPI Denies recent fever or chills. Denies sinus pressure, nasal congestion, ear pain or sore throat. Denies chest congestion, productive cough or wheezing. Denies chest pains, palpitations and leg swelling .   Denies dysuria, frequency, hesitancy  Denies joint pain, swelling and limitation in mobility. Denies headaches, seizures, numbness, or tingling. Denies depression, anxiety or insomnia. Denies skin break down or rash.        Objective:   Physical Exam BP 126/64 mmHg  Pulse 88  Resp 18  Ht 6\' 1"  (1.854 m)  Wt 189 lb 1.9 oz (85.784 kg)  BMI 24.96 kg/m2  SpO2 99% Patient alert and oriented and in no cardiopulmonary distress.Ill appearing, weak  HEENT: No facial asymmetry, EOMI,   oropharynx pink and moist.  Neck supple no JVD, no mass.  Chest: Clear to auscultation bilaterally.  CVS: S1, S2 no murmurs, no S3.Regular rate.  ABD: Soft no localized tenderness o r rebound, hyperactive BS Stool : heme negative  Ext: No edema  MS: Adequate ROM spine, shoulders, hips and knees.  Skin: Intact, no ulcerations or rash noted.  Psych: Good eye contact, normal affect. Memory intact not anxious or depressed appearing.  CNS: CN 2-12 intact, power,  normal throughout.no focal deficits noted.        Assessment & Plan:  Acute gastroenteritis Acute onset, symptomatic treatment , out of work x 2 days, zofran 4mg  Im in office Rectal exam : heme negative stool Stat labs, adequately  hydrated   Essential hypertension Controlled, no change in medication    Abdominal pain, chronic, right lower quadrant Three year h/o RLQ pain will refer  For GI re eval, colonoscopy due in 2017, option for  Sooner testing will be addressed at the visit   Leukocytopenia Downward trend in most recent test, will refer back to [facility] hematology for re eval   Thrombocytopenia Downward trend in today's lab, will refer back to [facility] hematology for re eval   ALLERGIC RHINITIS No current flare

## 2014-08-16 ENCOUNTER — Ambulatory Visit (INDEPENDENT_AMBULATORY_CARE_PROVIDER_SITE_OTHER): Payer: BLUE CROSS/BLUE SHIELD | Admitting: Internal Medicine

## 2014-08-16 ENCOUNTER — Encounter (INDEPENDENT_AMBULATORY_CARE_PROVIDER_SITE_OTHER): Payer: Self-pay | Admitting: Internal Medicine

## 2014-08-16 VITALS — BP 102/66 | HR 80 | Temp 98.4°F | Resp 18 | Ht 73.0 in | Wt 188.2 lb

## 2014-08-16 DIAGNOSIS — R1031 Right lower quadrant pain: Secondary | ICD-10-CM

## 2014-08-16 DIAGNOSIS — D72819 Decreased white blood cell count, unspecified: Secondary | ICD-10-CM

## 2014-08-16 MED ORDER — DICYCLOMINE HCL 10 MG PO CAPS: 10.0000 mg | ORAL_CAPSULE | Freq: Three times a day (TID) | ORAL | Status: DC | PRN

## 2014-08-16 NOTE — Progress Notes (Signed)
Presenting complaint;   Recent bout with nausea vomiting and diarrhea.  Right low quadrant abdominal pain.  Subjective:   Mark Sandoval is 64 year old African-American male who is well known to me from previous evaluation  In May 2014 when he was evaluated for right low quadrant abdominal pain and underwent abdominopelvic CT which was unremarkable except incidental finding of bilateral renal cysts. He remains with pain in right low quadrant which is described as dull pain and occasionally sharp but never intractable and it does not interfere with his ability to work or concentrate. He has had this pain for a few years.  He does have few pain-free days. Pain is fairly localized. He denies dysuria hematuria diarrhea or constipation.   Last week on 08/11/2014 he developed nausea to be followed by multiple episodes of vomiting and then he developed diarrhea. He did not experience hematemesmelena fever or chills. He was seen by Dr. Moshe Cipro on 08/13/2014. His stool was guaiac negative. He was not able to provide stool sample for further studies. He was given prescription for diphenoxylate and ondansetron but he has not used either. He is now having normal stools. He has been on bland diet.  He did not have good appetite for a few days but now is back to normal. He states he lost about 5 pounds with this illness. He has gained 10 pounds is his last visit 2 years ago.  He also had lab studies. He was noted to have WBC of 2.7 with normal differential and platelet count was 124K.  Previously his white cell count has always been above 3000 and platelet count has been higher. He previously has been evaluated by Dr. Tressie Stalker and felt to have normal variation. He rarely experiences heartburn. He occasionally takes omeprazole. He initially thought omeprazole helped with right low quadrant abdominal pain but this benefit only lasted for couple of days.  Current Medications: Current Outpatient Prescriptions  Medication  Sig Dispense Refill  . amLODipine (NORVASC) 5 MG tablet TAKE 1 TABLET BY MOUTH EVERY DAY 30 tablet 2  . azelastine (ASTELIN) 137 MCG/SPRAY nasal spray TAKE 2 PUFFS TWO TIMES A DAY 30 mL 3  . omeprazole (PRILOSEC) 20 MG capsule TAKE 1 CAPSULE (20 MG TOTAL) BY MOUTH DAILY. 30 capsule 5  . aspirin (RA ASPIRIN EC ADULT LOW ST) 81 MG EC tablet Take 81 mg by mouth daily.      [name] dicyclomine (BENTYL) 10 MG capsule Take 1 capsule (10 mg total) by mouth 3 (three) times daily as needed for spasms. 60 capsule 0  . diphenoxylate-atropine (LOMOTIL) 2.5-0.025 MG per tablet Take 1 tablet by mouth 4 (four) times daily as needed for diarrhea or loose stools. (Patient not taking: Reported on 08/16/2014) 30 tablet 0  . ondansetron (ZOFRAN-ODT) 4 MG disintegrating tablet Take 1 tablet (4 mg total) by mouth every 8 (eight) hours as needed for nausea or vomiting. (Patient not taking: Reported on 08/16/2014) 20 tablet 0   No current facility-administered medications for this visit.   Past medical history; Hypertension. History of borderline serum creatinine previously felt to be normal and now mildly elevated. History of shingles several years ago with a rash below the belt line on the right side. History of mild thrombocytopenia and leukopenia for which she's been evaluated by Dr. Tressie Stalker. Final impression was that this pattern is normal for Dr. Woodfin Ganja. One of his daughters also has mild thrombocytopenia. Left knee arthroscopy several years ago for sports related injury. 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 Had EGD and screening colonoscopy in September 2007. EGD was performed for heartburn and epigastric pain. His H. pylori serology was negative. Colonoscopy revealed few diverticula at ascending colon. Laparoscopic cholecystectomy for biliary dyskinesia in December  2007.  Allergies;  Prochlorperazine   Objective: Blood pressure 102/66, pulse 80, temperature 98.4 F (36.9 C), temperature source Oral, resp. rate 18, height 6\' 1"  (1.854 m), weight 188 lb 3.2 oz (85.367 kg). Patient is alert and in no acute distress. Conjunctiva is pink. Sclera is nonicteric Oropharyngeal mucosa is normal. No neck masses or thyromegaly noted. Cardiac exam with regular rhythm normal S1 and S2. No murmur or gallop noted. Lungs are clear to auscultation. Abdomen is flat. Bowel sounds are normal. On palpation abdomen is soft without organomegaly or masses. He has mild weight tenderness on deep palpation in right low quadrant. Rectal examination deferred. He just had one by Dr. Moshe Cipro revealing guaiac-negative stool.  No LE edema or clubbing noted.  Labs/studies Results:  lab data from 08/12/2014.  WBC 2.7, H&H 14.2 and 42.7 and platelet count 124K  MCV 68  Differential revealed neutrophils 66%, lymphocytes 27% monocytes 7% Metabolic 7 normal except serum creatinine 1.42 Bilirubin 0.5, AP 88, AST 30, ALT 33 total protein 6.5 and albumin 4.2.    Assessment:  #1.  Chronic right low quadrant abdominal pain. He has history of herpes zoster involving this region. Therefore this pain could be post herpetic neuralgia. However pain is deep and may be visceral pain. IBS remains in differential diagnosis but he does not have other symptoms to support this diagnosis. Last colonoscopy was in September 2007. His stool is guaiac-negative an acute symptoms have resolved and therefore I do not feel colonoscopy now would help in his management. Will try him on anti-spasmodic. #2.  Recent bout with nausea vomiting and diarrhea. Symptoms have completely resolved.  He could have had while gastroenteritis given worsening leukopenia.   Plan:  Can use omeprazole 20 mg daily when necessary. CBC with differential and metabolic 7 in 2 weeks.  Dicyclomine 10 mg by mouth 3 times a day when   necessary.  If diarrhea relapses will do GI pathogen panel.  Unless symptoms change will plan colonoscopy in September next year.

## 2014-08-16 NOTE — Patient Instructions (Addendum)
Can use omeprazole or Prilosec on as-needed basis. Dicyclomine 10 mg by mouth before each meal on as-needed basis. Blood work to be done in 2 weeks. Physician will call with results. Call if diarrhea recurs or he have blood in stool. Next colonoscopy in September 2017

## 2014-08-17 ENCOUNTER — Telehealth (INDEPENDENT_AMBULATORY_CARE_PROVIDER_SITE_OTHER): Payer: Self-pay | Admitting: *Deleted

## 2014-08-17 ENCOUNTER — Encounter (INDEPENDENT_AMBULATORY_CARE_PROVIDER_SITE_OTHER): Payer: Self-pay | Admitting: *Deleted

## 2014-08-17 DIAGNOSIS — R1031 Right lower quadrant pain: Secondary | ICD-10-CM

## 2014-08-17 DIAGNOSIS — D72819 Decreased white blood cell count, unspecified: Secondary | ICD-10-CM

## 2014-08-17 NOTE — Telephone Encounter (Signed)
Per Dr.Rehman the patient will need to have labs drawn 08/30/14.

## 2014-09-01 LAB — CBC WITH DIFFERENTIAL/PLATELET
Basophils Absolute: 0 10*3/uL (ref 0.0–0.1)
Basophils Relative: 1 % (ref 0–1)
EOS PCT: 2 % (ref 0–5)
Eosinophils Absolute: 0.1 10*3/uL (ref 0.0–0.7)
HEMATOCRIT: 38.3 % — AB (ref 39.0–52.0)
Hemoglobin: 12.8 g/dL — ABNORMAL LOW (ref 13.0–17.0)
LYMPHS ABS: 1.7 10*3/uL (ref 0.7–4.0)
Lymphocytes Relative: 40 % (ref 12–46)
MCH: 22.5 pg — AB (ref 26.0–34.0)
MCHC: 33.4 g/dL (ref 30.0–36.0)
MCV: 67.2 fL — AB (ref 78.0–100.0)
MONOS PCT: 6 % (ref 3–12)
Monocytes Absolute: 0.3 10*3/uL (ref 0.1–1.0)
Neutro Abs: 2.1 10*3/uL (ref 1.7–7.7)
Neutrophils Relative %: 51 % (ref 43–77)
Platelets: 171 10*3/uL (ref 150–400)
RBC: 5.7 MIL/uL (ref 4.22–5.81)
RDW: 15.6 % — AB (ref 11.5–15.5)
WBC: 4.2 10*3/uL (ref 4.0–10.5)

## 2014-09-01 LAB — BASIC METABOLIC PANEL
BUN: 25 mg/dL — ABNORMAL HIGH (ref 6–23)
CHLORIDE: 103 meq/L (ref 96–112)
CO2: 29 mEq/L (ref 19–32)
Calcium: 8.9 mg/dL (ref 8.4–10.5)
Creat: 1.25 mg/dL (ref 0.50–1.35)
Glucose, Bld: 97 mg/dL (ref 70–99)
Potassium: 4.6 mEq/L (ref 3.5–5.3)
SODIUM: 140 meq/L (ref 135–145)

## 2014-09-03 ENCOUNTER — Ambulatory Visit: Payer: BC Managed Care – PPO | Admitting: Family Medicine

## 2014-09-06 ENCOUNTER — Other Ambulatory Visit: Payer: Self-pay | Admitting: Family Medicine

## 2014-09-09 ENCOUNTER — Ambulatory Visit (INDEPENDENT_AMBULATORY_CARE_PROVIDER_SITE_OTHER): Payer: BLUE CROSS/BLUE SHIELD | Admitting: Family Medicine

## 2014-09-09 ENCOUNTER — Encounter: Payer: Self-pay | Admitting: Family Medicine

## 2014-09-09 VITALS — BP 118/76 | HR 83 | Resp 16 | Ht 73.0 in | Wt 191.0 lb

## 2014-09-09 DIAGNOSIS — I10 Essential (primary) hypertension: Secondary | ICD-10-CM

## 2014-09-09 DIAGNOSIS — D72819 Decreased white blood cell count, unspecified: Secondary | ICD-10-CM | POA: Diagnosis not present

## 2014-09-09 DIAGNOSIS — J3089 Other allergic rhinitis: Secondary | ICD-10-CM

## 2014-09-09 DIAGNOSIS — R1031 Right lower quadrant pain: Secondary | ICD-10-CM

## 2014-09-09 DIAGNOSIS — E785 Hyperlipidemia, unspecified: Secondary | ICD-10-CM | POA: Diagnosis not present

## 2014-09-09 DIAGNOSIS — G8929 Other chronic pain: Secondary | ICD-10-CM

## 2014-09-09 NOTE — Progress Notes (Signed)
Subjective:    Patient ID: Mark Sandoval, male    DOB: [birth year], 64 y.o.   MRN: [id number]  HPI The PT is here for follow up and re-evaluation of chronic medical conditions, medication management and review of any available recent lab and radiology data.  Preventive health is updated, specifically  Cancer screening and Immunization.   Questions or concerns regarding consultations or procedures which the PT has had in the interim are  Addressed.Saw gI, no procedure recommended  The PT denies any adverse reactions to current medications since the last visit.  There are no new concerns.  Marked improvement in sleep, gets into bed by 10 pm with uninterrupted sleep, feels much improved      Review of Systems See HPI Denies recent fever or chills. Denies sinus pressure, nasal congestion, ear pain or sore throat. Denies chest congestion, productive cough or wheezing. Denies chest pains, palpitations and leg swelling Denies abdominal pain, nausea, vomiting,diarrhea or constipation.   Denies dysuria, frequency, hesitancy or incontinence. Denies joint pain, swelling and limitation in mobility. Denies headaches, seizures, numbness, or tingling. Denies depression, anxiety or insomnia. Denies skin break down or rash.         Objective:   Physical Exam BP 118/76 mmHg  Pulse 83  Resp 16  Ht 6\' 1"  (1.854 m)  Wt 191 lb (86.637 kg)  BMI 25.20 kg/m2  SpO2 98% Patient alert and oriented and in no cardiopulmonary distress.  HEENT: No facial asymmetry, EOMI,   oropharynx pink and moist.  Neck supple no JVD, no mass.  Chest: Clear to auscultation bilaterally.  CVS: S1, S2 no murmurs, no S3.Regular rate.  ABD: Soft non tender.   Ext: No edema  MS: Adequate ROM spine, shoulders, hips and knees.  Skin: Intact, no ulcerations or rash noted.  Psych: Good eye contact, normal affect. Memory intact not anxious or depressed appearing.  CNS: CN 2-12 intact, power,  normal  throughout.no focal deficits noted.        Assessment & Plan:  Essential hypertension Controlled, no change in medication DASH diet and commitment to daily physical activity for a minimum of 30 minutes discussed and encouraged, as a part of hypertension management. The importance of attaining a healthy weight is also discussed.  BP/Weight 09/09/2014 08/16/2014 08/12/2014 04/02/2014 02/12/2014 78/05/9560 04/19/863  Systolic BP 784 696 295 284 132 440 102  Diastolic BP 76 66 64 80 92 90 84  Wt. (Lbs) 191 188.2 189.12 192 - 187 187.4  BMI 25.2 24.84 24.96 25.34 - 24.68 24.73         Dyslipidemia, goal LDL below 100 Hyperlipidemia:Low fat diet discussed and encouraged.  Updated lab needed at/ before next visit.    Lipid Panel  Lab Results  Component Value Date   CHOL 172 01/11/2014   HDL 53 01/11/2014   LDLCALC 105* 01/11/2014   TRIG 68 01/11/2014   CHOLHDL 3.2 01/11/2014         Abdominal pain, chronic, right lower quadrant Recent re eval by GI, decision made to have bentyl available for as needed use, pt has not needed to use this in the pas 3 weeks since getting script   Leukocytopenia Has appt at [facility] for re eval in the Fall, has been there in remote past, recent lab showed low HB, has had low WBC and platelets for some time on an intermittent basis   Allergic rhinitis Denies any flares for the season, has as needed medication available

## 2014-09-09 NOTE — Patient Instructions (Addendum)
F/U in 5 .5 month, call if you need me before  Fasting lipid, hepatic , chem 7 and TSH  in 5.5 months  Call in September for flu vaccine  Glad that you are much better  Please work on good  health habits so that your health will improve. 1. Commitment to daily physical activity for 30 to 60  minutes, if you are able to do this.  2. Commitment to wise food choices. Aim for half of your  food intake to be vegetable and fruit, one quarter starchy foods, and one quarter protein. Try to eat on a regular schedule  3 meals per day, snacking between meals should be limited to vegetables or fruits or small portions of nuts. 64 ounces of water per day is generally recommended, unless you have specific health conditions, like heart failure or kidney failure where you will need to limit fluid intake.  3. Commitment to sufficient and a  good quality of physical and mental rest daily, generally between 6 to 8 hours per day.  WITH PERSISTANCE AND PERSEVERANCE, THE IMPOSSIBLE , BECOMES THE NORM!   Thanks for choosing [facility], we consider it a privelige to serve you.

## 2014-09-12 NOTE — Assessment & Plan Note (Signed)
Controlled, no change in medication DASH diet and commitment to daily physical activity for a minimum of 30 minutes discussed and encouraged, as a part of hypertension management. The importance of attaining a healthy weight is also discussed.  BP/Weight 09/09/2014 08/16/2014 08/12/2014 04/02/2014 02/12/2014 83/11/1838 06/21/5434  Systolic BP 067 703 403 524 818 590 931  Diastolic BP 76 66 64 80 92 90 84  Wt. (Lbs) 191 188.2 189.12 192 - 187 187.4  BMI 25.2 24.84 24.96 25.34 - 24.68 24.73

## 2014-09-12 NOTE — Assessment & Plan Note (Signed)
Recent re eval by GI, decision made to have bentyl available for as needed use, pt has not needed to use this in the pas 3 weeks since getting script

## 2014-09-12 NOTE — Assessment & Plan Note (Signed)
Denies any flares for the season, has as needed medication available

## 2014-09-12 NOTE — Assessment & Plan Note (Signed)
Has appt at [facility] for re eval in the Fall, has been there in remote past, recent lab showed low HB, has had low WBC and platelets for some time on an intermittent basis

## 2014-09-12 NOTE — Assessment & Plan Note (Signed)
Hyperlipidemia:Low fat diet discussed and encouraged.  Updated lab needed at/ before next visit.    Lipid Panel  Lab Results  Component Value Date   CHOL 172 01/11/2014   HDL 53 01/11/2014   LDLCALC 105* 01/11/2014   TRIG 68 01/11/2014   CHOLHDL 3.2 01/11/2014

## 2014-12-02 ENCOUNTER — Ambulatory Visit: Payer: Self-pay | Admitting: Family Medicine

## 2014-12-05 ENCOUNTER — Other Ambulatory Visit: Payer: Self-pay | Admitting: Family Medicine

## 2014-12-14 ENCOUNTER — Ambulatory Visit (INDEPENDENT_AMBULATORY_CARE_PROVIDER_SITE_OTHER): Payer: BLUE CROSS/BLUE SHIELD | Admitting: Orthopedic Surgery

## 2014-12-14 ENCOUNTER — Encounter: Payer: Self-pay | Admitting: Orthopedic Surgery

## 2014-12-14 ENCOUNTER — Ambulatory Visit (INDEPENDENT_AMBULATORY_CARE_PROVIDER_SITE_OTHER): Payer: BLUE CROSS/BLUE SHIELD

## 2014-12-14 VITALS — BP 144/90 | Ht 73.0 in | Wt 191.0 lb

## 2014-12-14 DIAGNOSIS — M4302 Spondylolysis, cervical region: Secondary | ICD-10-CM

## 2014-12-14 DIAGNOSIS — M542 Cervicalgia: Secondary | ICD-10-CM

## 2014-12-14 DIAGNOSIS — M25511 Pain in right shoulder: Secondary | ICD-10-CM | POA: Diagnosis not present

## 2014-12-14 NOTE — Progress Notes (Signed)
Patient ID: Mark Sandoval, male   DOB: 20-Jul-1950, 64 y.o.   MRN: [id number]   Chief Complaint  Patient presents with  . Shoulder Pain    right shoulder pain, no known injury    Mark Sandoval is a 64 y.o. male.   HPI 64 year old male Mark Sandoval presents with atypical right shoulder pain in the periscapular region occasionally radiating up to his spine but not associated with any radicular symptoms in the right arm. He says when his arm is in abduction and he's working after a period of time he will get a sharp pain in the superior angle of the scapula which is stabbing but not radiating. No treatment is had for 18 months. He got concerned when he had the symptoms while walking triggering Mark Sandoval  Only review of systems were reported as seasonal allergies   Review of Systems See hpi  Past Medical History  Diagnosis Date  . GERD (gastroesophageal reflux disease)   . Arthritis of wrist     both   . Dyslipidemia   . Hypertension   . Lump or mass in breast     benign  . Thrombocytopenia   . Leukopenia   . History of seasonal allergies     Allergies  Allergen Reactions  . Compazine   . Prochlorperazine Edisylate     Current Outpatient Prescriptions  Medication Sig Dispense Refill  . amLODipine (NORVASC) 5 MG tablet TAKE 1 TABLET BY MOUTH EVERY DAY 30 tablet 3  . aspirin (RA ASPIRIN EC ADULT LOW ST) 81 MG EC tablet Take 81 mg by mouth daily.      [name] azelastine (ASTELIN) 137 MCG/SPRAY nasal spray TAKE 2 PUFFS TWO TIMES A DAY (Patient not taking: Reported on 12/14/2014) 30 mL 3  . dicyclomine (BENTYL) 10 MG capsule Take 1 capsule (10 mg total) by mouth 3 (three) times daily as needed for spasms. (Patient not taking: Reported on 12/14/2014) 60 capsule 0  . omeprazole (PRILOSEC) 20 MG capsule TAKE 1 CAPSULE (20 MG TOTAL) BY MOUTH DAILY. (Patient not taking: Reported on 12/14/2014) 30 capsule 5   No current facility-administered medications for this visit.     Physical  Exam Blood pressure 144/90, height 6\' 1"  (1.854 m), weight 191 lb (86.637 kg). Physical Exam  The patient is well developed well nourished and well groomed.   Orientation to person place and time is normal   Mood is pleasant.  Ambulatory status normal heel-toe gait  Inspection: Cervical spine range of motion is normal cervical spine nontender shoulder nontender    Full range of motion of the shoulder shoulder stable motor exam of the shoulder normal rotator cuff tests were negative for symptoms or pain    Skin is intact neurovascular exam is normal lymph nodes are negative    Superior angle of the scapula seems to be where the pain is coming from most I cannot produce it however.     Data Reviewed Order xrays yes Read xrays  yes, interpreted as cervical spondylosis 2 views of the C-spine were obtained Reports reviewed: no  Diagnosis New with further work up no New without further work up yes Encounter Diagnoses  Name Primary?  . Right shoulder pain   . Cervical spondylolysis   . Trigger point of right shoulder region Yes      Management Trigger point injection  40 mg of Depo-Medrol and 3 mL 1% lidocaine injected into the superior angle of scapular region. Verbal consent was obtained prior  to injection. Timeout was taken to confirm. Ethyl chloride and alcohol were used to prepare the skin for injection.  Patient will follow-up by phone call after a week for possible repeat injection

## 2014-12-14 NOTE — Patient Instructions (Signed)
Trigger Point Injection Trigger points are areas where you have muscle pain. A trigger point injection is a shot given in the trigger point to relieve that pain. A trigger point might feel like a knot in your muscle. It hurts to press on a trigger point. Sometimes the pain spreads out (radiates) to other parts of the body. For example, pressing on a trigger point in your shoulder might cause pain in your arm or neck. You might have one trigger point. Or, you might have more than one. People often have trigger points in their upper back and lower back. They also occur often in the neck and shoulders. Pain from a trigger point lasts for a long time. It can make it hard to keep moving. You might not be able to do the exercise or physical therapy that could help you deal with the pain. A trigger point injection may help. It does not work for everyone. But, it may relieve your pain for a few days or a few months. A trigger point injection does not cure long-lasting (chronic) pain. LET YOUR CAREGIVER KNOW ABOUT:  Any allergies (especially to latex, lidocaine, or steroids).  Blood-thinning medicines that you take. These drugs can lead to bleeding or bruising after an injection. They include:  Aspirin.  Ibuprofen.  Clopidogrel.  Warfarin.  Other medicines you take. This includes all vitamins, herbs, eyedrops, over-the-counter medicines, and creams.  Use of steroids.  Recent infections.  Past problems with numbing medicines.  Bleeding problems.  Surgeries you have had.  Other health problems. RISKS AND COMPLICATIONS A trigger point injection is a safe treatment. However, problems may develop, such as:  Minor side effects usually go away in 1 to 2 days. These may include:  Soreness.  Bruising.  Stiffness.  More serious problems are rare. But, they may include:  Bleeding under the skin (hematoma).  Skin infection.  Breaking off of the needle under your skin.  Lung  puncture.  The trigger point injection may not work for you. BEFORE THE PROCEDURE You may need to stop taking any medicine that thins your blood. This is to prevent bleeding and bruising. Usually these medicines are stopped several days before the injection. No other preparation is needed. PROCEDURE  A trigger point injection can be given in your caregiver's office or in a clinic. Each injection takes 2 minutes or less.  Your caregiver will feel for trigger points. The caregiver may use a marker to circle the area for the injection.  The skin over the trigger point will be washed with a germ-killing (antiseptic) solution.  The caregiver pinches the spot for the injection.  Then, a very thin needle is used for the shot. You may feel pain or a twitching feeling when the needle enters the trigger point.  A numbing solution may be injected into the trigger point. Sometimes a drug to keep down swelling, redness, and warmth (inflammation) is also injected.  Your caregiver moves the needle around the trigger zone until the tightness and twitching goes away.  After the injection, your caregiver may put gentle pressure over the injection site.  Then it is covered with a bandage. AFTER THE PROCEDURE  You can go right home after the injection.  The bandage can be taken off after a few hours.  You may feel sore and stiff for 1 to 2 days.  Go back to your regular activities slowly. Your caregiver may ask you to stretch your muscles. Do not do anything that takes   extra energy for a few days.  Follow your caregiver's instructions to manage and treat other pain. Document Released: 03/22/2011 Document Revised: 07/28/2012 Document Reviewed: 03/22/2011 Exit[employer] Patient Information 2015 [employer]. This information is not intended to replace advice given to you by your health care provider. Make sure you discuss any questions you have with your health care provider.  

## 2014-12-29 ENCOUNTER — Telehealth: Payer: Self-pay | Admitting: *Deleted

## 2014-12-29 NOTE — Telephone Encounter (Signed)
Patient's wife called and confirmed appt for patient

## 2015-01-10 ENCOUNTER — Encounter: Payer: Self-pay | Admitting: Family Medicine

## 2015-01-11 ENCOUNTER — Encounter: Payer: Self-pay | Admitting: Family Medicine

## 2015-01-11 ENCOUNTER — Telehealth: Payer: Self-pay | Admitting: Family Medicine

## 2015-01-11 ENCOUNTER — Telehealth: Payer: Self-pay

## 2015-01-11 ENCOUNTER — Other Ambulatory Visit: Payer: Self-pay | Admitting: Family Medicine

## 2015-01-11 DIAGNOSIS — N401 Enlarged prostate with lower urinary tract symptoms: Principal | ICD-10-CM

## 2015-01-11 DIAGNOSIS — N281 Cyst of kidney, acquired: Secondary | ICD-10-CM

## 2015-01-11 DIAGNOSIS — N138 Other obstructive and reflux uropathy: Secondary | ICD-10-CM

## 2015-01-11 NOTE — Telephone Encounter (Signed)
Sent for last 2 office visit notes.

## 2015-01-11 NOTE — Telephone Encounter (Signed)
Referral has been sent to [facility] urology, they will contact him with a appt I called and spoke with Mrs Striplin she stated Mark Sandoval was at work and she could take a message for him, I made are aware about the referral to [facility] and they will contact him with the appt.

## 2015-01-11 NOTE — Telephone Encounter (Signed)
Pls send for his last 2 urology visits , Dr Janice Norrie  In 2015 and most recent in 2016 with new Doc, he will be referred

## 2015-01-11 NOTE — Telephone Encounter (Signed)
Pls refer pt to [facility], the necessary info, his last urology exam and ct scan reoport are to be sent, the referral is entered, pt sent a msg requesting th referral pls let him know when you have sent the info over/ if you get appt date or if they will contact him, thanks

## 2015-01-17 ENCOUNTER — Encounter: Payer: Self-pay | Admitting: Family Medicine

## 2015-01-24 ENCOUNTER — Encounter: Payer: Self-pay | Admitting: Family Medicine

## 2015-01-24 ENCOUNTER — Telehealth: Payer: Self-pay | Admitting: *Deleted

## 2015-01-24 NOTE — Telephone Encounter (Signed)
I called patient, I spoke to patient's wife Ahmaud Duthie stated that Mr Bistline was with a patient but she could take the information for him if it was about his [facility] appt. I made her aware that Mr Dipierro is scheduled for 03/07/15 at 8:30 with Dr. Su Hoff address [place] ph # [phone]. I spoke with [facility] they stated that their fax machine has been down and that is why they have not got the first referral that was sent, they gave me another fax number that is directly to the providers office [phone]. Referral has been faxed.

## 2015-01-24 NOTE — Telephone Encounter (Signed)
Noted  

## 2015-01-30 ENCOUNTER — Telehealth: Payer: Self-pay | Admitting: Family Medicine

## 2015-01-30 NOTE — Telephone Encounter (Signed)
pls see his unread e mail re his request for referral to [facility] urology, pls call , ensure he knows the response and follow through if he does not already have appt with the referral staff's help, thanks

## 2015-01-31 NOTE — Telephone Encounter (Signed)
Patient given appt.  See previous telephone message

## 2015-02-02 ENCOUNTER — Ambulatory Visit (INDEPENDENT_AMBULATORY_CARE_PROVIDER_SITE_OTHER): Payer: BLUE CROSS/BLUE SHIELD

## 2015-02-02 DIAGNOSIS — Z23 Encounter for immunization: Secondary | ICD-10-CM

## 2015-02-22 ENCOUNTER — Telehealth: Payer: Self-pay | Admitting: Family Medicine

## 2015-02-22 NOTE — Telephone Encounter (Signed)
Mark Sandoval is calling stating that Dr. Woodfin Ganja is needing labs drawn before appointment Thursday, she is asking if he needs to be fasting or not and if the order can be faxed to [facility],please advise?

## 2015-02-22 NOTE — Telephone Encounter (Signed)
Called and left message that order was sent to lab

## 2015-02-23 ENCOUNTER — Other Ambulatory Visit: Payer: Self-pay | Admitting: Family Medicine

## 2015-02-23 LAB — HEPATIC FUNCTION PANEL
ALK PHOS: 89 U/L (ref 40–115)
ALT: 16 U/L (ref 9–46)
AST: 18 U/L (ref 10–35)
Albumin: 4.2 g/dL (ref 3.6–5.1)
BILIRUBIN INDIRECT: 0.5 mg/dL (ref 0.2–1.2)
Bilirubin, Direct: 0.2 mg/dL (ref <=0.2)
Total Bilirubin: 0.7 mg/dL (ref 0.2–1.2)
Total Protein: 6.8 g/dL (ref 6.1–8.1)

## 2015-02-23 LAB — BASIC METABOLIC PANEL
BUN: 17 mg/dL (ref 7–25)
CHLORIDE: 101 mmol/L (ref 98–110)
CO2: 30 mmol/L (ref 20–31)
Calcium: 9.2 mg/dL (ref 8.6–10.3)
Creat: 1.44 mg/dL — ABNORMAL HIGH (ref 0.70–1.25)
Glucose, Bld: 93 mg/dL (ref 65–99)
POTASSIUM: 4.2 mmol/L (ref 3.5–5.3)
SODIUM: 139 mmol/L (ref 135–146)

## 2015-02-23 LAB — LIPID PANEL
CHOLESTEROL: 181 mg/dL (ref 125–200)
HDL: 63 mg/dL (ref >=40)
LDL CALC: 105 mg/dL (ref <130)
TRIGLYCERIDES: 67 mg/dL (ref <150)
Total CHOL/HDL Ratio: 2.9 Ratio (ref <=5.0)
VLDL: 13 mg/dL (ref <30)

## 2015-02-23 LAB — TSH: TSH: 2.033 u[IU]/mL (ref 0.350–4.500)

## 2015-02-24 ENCOUNTER — Encounter: Payer: Self-pay | Admitting: Family Medicine

## 2015-02-24 ENCOUNTER — Ambulatory Visit (INDEPENDENT_AMBULATORY_CARE_PROVIDER_SITE_OTHER): Payer: BLUE CROSS/BLUE SHIELD | Admitting: Family Medicine

## 2015-02-24 VITALS — BP 136/88 | HR 88 | Resp 16 | Ht 73.0 in | Wt 189.0 lb

## 2015-02-24 DIAGNOSIS — J3089 Other allergic rhinitis: Secondary | ICD-10-CM

## 2015-02-24 DIAGNOSIS — E785 Hyperlipidemia, unspecified: Secondary | ICD-10-CM

## 2015-02-24 DIAGNOSIS — D72819 Decreased white blood cell count, unspecified: Secondary | ICD-10-CM | POA: Diagnosis not present

## 2015-02-24 DIAGNOSIS — I10 Essential (primary) hypertension: Secondary | ICD-10-CM | POA: Diagnosis not present

## 2015-02-24 DIAGNOSIS — R221 Localized swelling, mass and lump, neck: Secondary | ICD-10-CM

## 2015-02-24 LAB — HEPATITIS C ANTIBODY: HCV Ab: NEGATIVE

## 2015-02-24 LAB — HIV ANTIBODY (ROUTINE TESTING W REFLEX): HIV 1&2 Ab, 4th Generation: NONREACTIVE

## 2015-02-24 NOTE — Progress Notes (Signed)
Subjective:    Patient ID: Mark Sandoval, male    DOB: [birth year], 64 y.o.   MRN: [id number]  HPI   Mark Sandoval     MRN: [id number]      DOB: 02-Nov-1950   HPI Mark Sandoval is here for follow up and re-evaluation of chronic medical conditions, medication management and review of any available recent lab and radiology data.  Preventive health is updated, specifically  Cancer screening and Immunization.   Questions or concerns regarding consultations or procedures which the PT has had in the interim are  Addressed. Currently being evaluated at [facility] for pancytopenia, has upcoming [place] of his liver, and also has an appt there with urology next month. C/o nocturia, poor quality of sleep as a result, and chronic fatigue. Has had another episode of drifting off to sleep while driving an approx 35 mile distance. Has a 10 hour work day with no break, and he has stopped exercising again, exhausted when he gets home. He is encouraged to commit to exercise in early morning as he awakens around 4:30 to 5 am  The PT denies any adverse reactions to current medications since the last visit.  There are no new concerns.  There are no specific complaints   ROS Denies recent fever or chills. Denies sinus pressure, nasal congestion, ear pain or sore throat. Denies chest congestion, productive cough or wheezing. Denies chest pains, palpitations and leg swelling Denies abdominal pain, nausea, vomiting,diarrhea or constipation.   Denies dysuria, frequency, hesitancy or incontinence. Denies joint pain, swelling and limitation in mobility. Denies headaches, seizures, numbness, or tingling. Denies depression, anxiety or insomnia. Denies skin break down or rash.   PE  BP 136/88 mmHg  Pulse 88  Resp 16  Ht 6\' 1"  (1.854 m)  Wt 189 lb (85.73 kg)  BMI 24.94 kg/m2  SpO2 99%  Patient alert and oriented and in no cardiopulmonary distress.  HEENT: No facial asymmetry, EOMI,   oropharynx pink  and moist.  Neck supple no JVD, no mass.  Chest: Clear to auscultation bilaterally.  CVS: S1, S2 no murmurs, no S3.Regular rate.  ABD: Soft non tender.   Ext: No edema  MS: Adequate ROM spine, shoulders, hips and knees.  Skin: Intact, no ulcerations or rash noted.  Psych: Good eye contact, normal affect. Memory intact not anxious or depressed appearing.  CNS: CN 2-12 intact, power,  normal throughout.no focal deficits noted.   Assessment & Plan  Essential hypertension Controlled, no change in medication Sub optimal control, pt to resume regular exercise and increase fruit and veg intake DASH diet and commitment to daily physical activity for a minimum of 30 minutes discussed and encouraged, as a part of hypertension management. The importance of attaining a healthy weight is also discussed.  BP/Weight 02/24/2015 12/14/2014 09/09/2014 08/16/2014 08/12/2014 04/02/2014 [id number]  Systolic BP [id number] 123456 123456 [id number] [id number]  Diastolic BP 88 90 76 66 64 80 92  Wt. (Lbs) 189 191 191 188.2 189.12 192 -  BMI 24.94 25.2 25.2 24.84 24.96 25.34 -            Allergic rhinitis No recent flare, has astelin for as needed use  Leukocytopenia Currently being evaluated at [facility]  Dyslipidemia, goal LDL below 100 Hyperlipidemia:Low fat diet discussed and encouraged.   Lipid Panel  Lab Results  Component Value Date   CHOL 181 02/23/2015   HDL 63 02/23/2015   LDLCALC 105 02/23/2015   TRIG 67 02/23/2015  CHOLHDL 2.9 02/23/2015   Updated lab needed at/ before next visit. Slightly elevated lDL, but overall excellent profile     Localized swelling, mass and lump, neck n o palpabel meass on exam, pt desribes intermittent lymphadenitis , he is somewhat reassured by this. Has had at least 3 different ENT evaluations with this concern in the past, no interest in further eval at this time      Review of Systems     Objective:   Physical Exam        Assessment & Plan:

## 2015-02-24 NOTE — Patient Instructions (Signed)
F/u in  6 month, call if you need me before  No med changes  Commit to daily physical activity before the day start 30 mins  Continue good sleep hygiene  Caffeine in limited quantitiy, tea, and a driver with you when you are driving is a good idea  Labs will be ordered 1 month before pls send message/ call for lab order

## 2015-02-25 ENCOUNTER — Encounter: Payer: Self-pay | Admitting: Family Medicine

## 2015-02-25 NOTE — Assessment & Plan Note (Signed)
No recent flare, has astelin for as needed use

## 2015-02-25 NOTE — Assessment & Plan Note (Signed)
Hyperlipidemia:Low fat diet discussed and encouraged.   Lipid Panel  Lab Results  Component Value Date   CHOL 181 02/23/2015   HDL 63 02/23/2015   LDLCALC 105 02/23/2015   TRIG 67 02/23/2015   CHOLHDL 2.9 02/23/2015   Updated lab needed at/ before next visit. Slightly elevated lDL, but overall excellent profile

## 2015-02-25 NOTE — Assessment & Plan Note (Signed)
Currently being evaluated at [facility]

## 2015-02-25 NOTE — Assessment & Plan Note (Signed)
n o palpabel meass on exam, pt desribes intermittent lymphadenitis , he is somewhat reassured by this. Has had at least 3 different ENT evaluations with this concern in the past, no interest in further eval at this time

## 2015-02-25 NOTE — Assessment & Plan Note (Addendum)
Controlled, no change in medication Sub optimal control, pt to resume regular exercise and increase fruit and veg intake DASH diet and commitment to daily physical activity for a minimum of 30 minutes discussed and encouraged, as a part of hypertension management. The importance of attaining a healthy weight is also discussed.  BP/Weight 02/24/2015 12/14/2014 09/09/2014 08/16/2014 08/12/2014 04/02/2014 [id number]  Systolic BP [id number] 123456 123456 A999333 [id number]  Diastolic BP 88 90 76 66 64 80 92  Wt. (Lbs) 189 191 191 188.2 189.12 192 -  BMI 24.94 25.2 25.2 24.84 24.96 25.34 -

## 2015-03-29 ENCOUNTER — Other Ambulatory Visit: Payer: Self-pay | Admitting: Family Medicine

## 2015-06-10 ENCOUNTER — Other Ambulatory Visit: Payer: Self-pay

## 2015-06-10 MED ORDER — AMLODIPINE BESYLATE 5 MG PO TABS: 5.0000 mg | ORAL_TABLET | Freq: Every day | ORAL | Status: DC

## 2015-07-07 ENCOUNTER — Other Ambulatory Visit: Payer: Self-pay

## 2015-07-07 MED ORDER — AMLODIPINE BESYLATE 5 MG PO TABS: 5.0000 mg | ORAL_TABLET | Freq: Every day | ORAL | Status: DC

## 2015-07-21 ENCOUNTER — Telehealth: Payer: Self-pay | Admitting: Family Medicine

## 2015-07-22 ENCOUNTER — Ambulatory Visit (INDEPENDENT_AMBULATORY_CARE_PROVIDER_SITE_OTHER): Payer: BLUE CROSS/BLUE SHIELD

## 2015-07-22 ENCOUNTER — Ambulatory Visit (INDEPENDENT_AMBULATORY_CARE_PROVIDER_SITE_OTHER): Payer: BLUE CROSS/BLUE SHIELD | Admitting: Orthopedic Surgery

## 2015-07-22 VITALS — BP 122/73 | Ht 73.0 in | Wt 192.0 lb

## 2015-07-22 DIAGNOSIS — M546 Pain in thoracic spine: Secondary | ICD-10-CM

## 2015-07-22 MED ORDER — DICLOFENAC POTASSIUM 50 MG PO TABS
50.0000 mg | ORAL_TABLET | Freq: Two times a day (BID) | ORAL | Status: DC
Start: 2015-07-22 — End: 2016-01-26

## 2015-07-22 NOTE — Progress Notes (Signed)
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 Basically what we note from him is that the pain starts around the medial scapula sometimes will radiate towards the spine and initially depending on his position when he was working as a [employer] would radiate into his posterior shoulder area.  He took over-the-counter NSAIDs and I gave him one injection of cortisone with minimal relief.  With bothering him now also is that he can't stand for long periods of time if he does gets excruciating pain in the same area and is relieved when he sits down.  Past Medical History  Diagnosis Date  . GERD (gastroesophageal reflux disease)   . Arthritis of wrist     both   . Dyslipidemia   . Hypertension   . Lump or mass in breast     benign  . Thrombocytopenia ([place])   . Leukopenia   . History of seasonal allergies     He has had several episodes of intense pain that he almost went to the emergency room but changed his position or sat down and the pain would go away  Review of systems seasonal allergies otherwise normal  Reexamination reveals that   BP 122/73 mmHg  Ht 6\' 1"  (1.854 m)  Wt 192 lb (87.091 kg)  BMI 25.34 kg/m2 Physical Exam  Constitutional: She is oriented to person, place, and time. She appears well-developed and well-nourished. No distress.  Cardiovascular: Intact distal pulses.   Neurological: She is alert and oriented to person, place, and time. She exhibits normal muscle tone. Coordination normal.  Skin: Skin is warm and dry. No rash noted. She is not diaphoretic. No erythema. No pallor.  Psychiatric: She has a normal mood and affect. Her behavior is normal. Judgment and thought content normal.  Gait is normal   he has full range of motion of the Each shoulder, there is one area of tenderness in the thoracic spine on the right at approximately T4  or 5. The cervical and lumbar spine is nontender. Strength and range of motion including scapular stabilizer resistance testing is normal in each shoulder no instability is detected.   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

## 2015-08-22 ENCOUNTER — Telehealth: Payer: Self-pay

## 2015-08-22 DIAGNOSIS — D696 Thrombocytopenia, unspecified: Secondary | ICD-10-CM

## 2015-08-22 DIAGNOSIS — R7301 Impaired fasting glucose: Secondary | ICD-10-CM

## 2015-08-22 DIAGNOSIS — E785 Hyperlipidemia, unspecified: Secondary | ICD-10-CM

## 2015-08-22 DIAGNOSIS — I10 Essential (primary) hypertension: Secondary | ICD-10-CM

## 2015-08-22 NOTE — Telephone Encounter (Signed)
Lab ordered.

## 2015-08-23 ENCOUNTER — Telehealth: Payer: Self-pay

## 2015-08-23 DIAGNOSIS — E785 Hyperlipidemia, unspecified: Secondary | ICD-10-CM

## 2015-08-23 DIAGNOSIS — I10 Essential (primary) hypertension: Secondary | ICD-10-CM

## 2015-08-23 LAB — CBC WITH DIFFERENTIAL/PLATELET
BASOS ABS: 36 {cells}/uL (ref 0–200)
Basophils Relative: 1 %
EOS ABS: 108 {cells}/uL (ref 15–500)
Eosinophils Relative: 3 %
HEMATOCRIT: 41.1 % (ref 38.5–50.0)
HEMOGLOBIN: 13.6 g/dL (ref 13.2–17.1)
LYMPHS ABS: 1404 {cells}/uL (ref 850–3900)
Lymphocytes Relative: 39 %
MCH: 22.9 pg — ABNORMAL LOW (ref 27.0–33.0)
MCHC: 33.1 g/dL (ref 32.0–36.0)
MCV: 69.2 fL — AB (ref 80.0–100.0)
MONO ABS: 252 {cells}/uL (ref 200–950)
Monocytes Relative: 7 %
NEUTROS PCT: 50 %
Neutro Abs: 1800 cells/uL (ref 1500–7800)
Platelets: 150 10*3/uL (ref 140–400)
RBC: 5.94 MIL/uL — AB (ref 4.20–5.80)
RDW: 17 % — ABNORMAL HIGH (ref 11.0–15.0)
WBC: 3.6 10*3/uL — ABNORMAL LOW (ref 3.8–10.8)

## 2015-08-23 LAB — LIPID PANEL
CHOLESTEROL: 178 mg/dL (ref 125–200)
HDL: 60 mg/dL (ref >=40)
LDL Cholesterol: 106 mg/dL (ref <130)
Total CHOL/HDL Ratio: 3 Ratio (ref <=5.0)
Triglycerides: 62 mg/dL (ref <150)
VLDL: 12 mg/dL (ref <30)

## 2015-08-23 LAB — BASIC METABOLIC PANEL
BUN: 20 mg/dL (ref 7–25)
CO2: 28 mmol/L (ref 20–31)
Calcium: 8.8 mg/dL (ref 8.6–10.3)
Chloride: 103 mmol/L (ref 98–110)
Creat: 1.3 mg/dL — ABNORMAL HIGH (ref 0.70–1.25)
Glucose, Bld: 92 mg/dL (ref 65–99)
POTASSIUM: 4.3 mmol/L (ref 3.5–5.3)
Sodium: 139 mmol/L (ref 135–146)

## 2015-08-23 LAB — TSH: TSH: 2.46 mIU/L (ref 0.40–4.50)

## 2015-08-23 LAB — HEMOGLOBIN A1C
HEMOGLOBIN A1C: 5.7 % — AB (ref <5.7)
HEMOGLOBIN A1C: 5.6 % (ref <5.7)
Mean Plasma Glucose: 117 mg/dL
Mean Plasma Glucose: 114 mg/dL

## 2015-08-23 NOTE — Telephone Encounter (Signed)
Labs ordered.

## 2015-08-25 ENCOUNTER — Encounter: Payer: Self-pay | Admitting: Family Medicine

## 2015-08-25 ENCOUNTER — Ambulatory Visit (INDEPENDENT_AMBULATORY_CARE_PROVIDER_SITE_OTHER): Payer: BLUE CROSS/BLUE SHIELD | Admitting: Family Medicine

## 2015-08-25 VITALS — BP 142/84 | HR 66 | Resp 16 | Ht 73.0 in | Wt 195.0 lb

## 2015-08-25 DIAGNOSIS — E785 Hyperlipidemia, unspecified: Secondary | ICD-10-CM

## 2015-08-25 DIAGNOSIS — I10 Essential (primary) hypertension: Secondary | ICD-10-CM | POA: Diagnosis not present

## 2015-08-25 DIAGNOSIS — R0789 Other chest pain: Secondary | ICD-10-CM | POA: Diagnosis not present

## 2015-08-25 DIAGNOSIS — Z1211 Encounter for screening for malignant neoplasm of colon: Secondary | ICD-10-CM

## 2015-08-25 DIAGNOSIS — D72819 Decreased white blood cell count, unspecified: Secondary | ICD-10-CM

## 2015-08-25 NOTE — Progress Notes (Signed)
Subjective:    Patient ID: Mark Sandoval, male    DOB: [birth year], 65 y.o.   MRN: [id number]  HPI   Mark Sandoval     MRN: [id number]      DOB: 1950/11/05   HPI Mark Sandoval is here for follow up and re-evaluation of chronic medical conditions, medication management and review of any available recent lab and radiology data.  Preventive health is updated, specifically  Cancer screening and Immunization.   Questions or concerns regarding consultations or procedures which the PT has had in the interim are  addressed. The PT denies any adverse reactions to current medications since the last visit.  10 month h/o intermittent posterior right chest pain, with activity, walking or at rest, limits his ability to walk, non radiating, no associated nausea or diaphoresis . Left upper extremity movement relieves, does not limit his golf game Has discussed with ortho , who did xrays of area which show arhthritis  ROS Denies recent fever or chills. Denies sinus pressure, nasal congestion, ear pain or sore throat. Denies chest congestion, productive cough or wheezing. Denies chest pains, palpitations and leg swelling Denies abdominal pain, nausea, vomiting,diarrhea or constipation.   Denies dysuria, frequency, hesitancy or incontinence. Denies joint pain, swelling and limitation in mobility. Denies headaches, seizures, numbness, or tingling. Denies depression, anxiety or insomnia. Denies skin break down or rash.   PE  BP 142/84 mmHg  Pulse 66  Resp 16  Ht 6\' 1"  (1.854 m)  Wt 195 lb (88.451 kg)  BMI 25.73 kg/m2  SpO2 98%  Patient alert and oriented and in no cardiopulmonary distress.  HEENT: No facial asymmetry, EOMI,   oropharynx pink and moist.  Neck supple no JVD, no mass.  Chest: Clear to auscultation bilaterally.  CVS: S1, S2 no murmurs, no S3.Regular rate.  ABD: Soft non tender.   Ext: No edema  MS: Adequate ROM spine, shoulders, hips and knees.  Skin: Intact,  no ulcerations or rash noted.  Psych: Good eye contact, normal affect. Memory intact not anxious or depressed appearing.  CNS: CN 2-12 intact, power,  normal throughout.no focal deficits noted.   Assessment & Plan   Essential hypertension Elevated at this viosit, needs  Lifestyle modification, has cut back on exercise and has high salt intake. No med change DASH diet and commitment to daily physical activity for a minimum of 30 minutes discussed and encouraged, as a part of hypertension management. The importance of attaining a healthy weight is also discussed.  BP/Weight 08/25/2015 07/22/2015 02/24/2015 12/14/2014 09/09/2014 08/16/2014 0000000  Systolic BP A999333 [id number] 123456 123456 [id number]  Diastolic BP 84 73 88 90 76 66 64  Wt. (Lbs) 195 192 189 191 191 188.2 189.12  BMI 25.73 25.34 24.94 25.2 25.2 24.84 24.96        Dyslipidemia, goal LDL below 100 Hyperlipidemia:Low fat diet discussed and encouraged.   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

## 2015-08-25 NOTE — Patient Instructions (Signed)
Welcome to medicare  In October , 2017, call if you need me before  Labs are good,  Continue plant based low sodium eating  Thank you  for choosing [facility]. We consider it a privelige to serve you.  Delivering excellent health care in a caring and  compassionate way is our goal.  Partnering with you,  so that together we can achieve this goal is our strategy.

## 2015-08-26 DIAGNOSIS — R0789 Other chest pain: Secondary | ICD-10-CM | POA: Insufficient documentation

## 2015-08-26 NOTE — Assessment & Plan Note (Signed)
Stable, will continue to follow 

## 2015-08-26 NOTE — Assessment & Plan Note (Signed)
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

## 2015-08-26 NOTE — Assessment & Plan Note (Signed)
Hyperlipidemia:Low fat diet discussed and encouraged.   Lipid Panel  Lab Results  Component Value Date   CHOL 178 08/23/2015   HDL 60 08/23/2015   LDLCALC 106 08/23/2015   TRIG 62 08/23/2015   CHOLHDL 3.0 08/23/2015

## 2015-08-26 NOTE — Assessment & Plan Note (Signed)
Elevated at this viosit, needs  Lifestyle modification, has cut back on exercise and has high salt intake. No med change DASH diet and commitment to daily physical activity for a minimum of 30 minutes discussed and encouraged, as a part of hypertension management. The importance of attaining a healthy weight is also discussed.  BP/Weight 08/25/2015 07/22/2015 02/24/2015 12/14/2014 09/09/2014 08/16/2014 0000000  Systolic BP [id number] 123456 123456 [id number]  Diastolic BP 84 73 88 90 76 66 64  Wt. (Lbs) 195 192 189 191 191 188.2 189.12  BMI 25.73 25.34 24.94 25.2 25.2 24.84 24.96

## 2015-08-29 ENCOUNTER — Encounter (INDEPENDENT_AMBULATORY_CARE_PROVIDER_SITE_OTHER): Payer: Self-pay | Admitting: *Deleted

## 2015-08-29 ENCOUNTER — Encounter: Payer: Self-pay | Admitting: Family Medicine

## 2015-08-30 NOTE — Telephone Encounter (Signed)
Didn't mean to open encounter °

## 2015-08-31 NOTE — Telephone Encounter (Signed)
Spoke with lab.  Representative states that there were actually 2 separate draws because the one on the 8th was stat and a SST was not done.  The second included the SST.  The #s are different due to the processing and it is possible for there to be a small difference in results.

## 2015-09-29 ENCOUNTER — Other Ambulatory Visit: Payer: Self-pay | Admitting: Family Medicine

## 2015-12-01 ENCOUNTER — Other Ambulatory Visit (INDEPENDENT_AMBULATORY_CARE_PROVIDER_SITE_OTHER): Payer: Self-pay | Admitting: *Deleted

## 2015-12-01 DIAGNOSIS — Z1211 Encounter for screening for malignant neoplasm of colon: Secondary | ICD-10-CM | POA: Insufficient documentation

## 2015-12-12 ENCOUNTER — Encounter: Payer: Self-pay | Admitting: Family Medicine

## 2015-12-15 ENCOUNTER — Other Ambulatory Visit: Payer: Self-pay | Admitting: Family Medicine

## 2015-12-15 DIAGNOSIS — M542 Cervicalgia: Secondary | ICD-10-CM

## 2015-12-16 ENCOUNTER — Telehealth: Payer: Self-pay | Admitting: Family Medicine

## 2015-12-16 NOTE — Telephone Encounter (Signed)
pls let pt know that Dr Carloyn Manner has denied appt at this time, he needs more info MRI or cT of neck) to see if his services as a Psychologist, sport and exercise are needed. O recommend pt have oV here or with ortho, Dr Aline Brochure, who has seen him in the past, with specific attention to neck and upper ext symptoms so that the imaging study may be ordered which will provide necessary info for Dr Carloyn Manner to see him in consultation if he feels he can help him  Offer an appt here next week if he wants pls as I was slow in responding to his e mail request for referral, thanks

## 2015-12-21 NOTE — Telephone Encounter (Signed)
noted 

## 2015-12-21 NOTE — Telephone Encounter (Signed)
I spoke with Dr. Woodfin Ganja and explained the Recommendations and he agrees and will call and talk to Dr. Aline Brochure for follow up as needed

## 2016-01-05 ENCOUNTER — Ambulatory Visit (INDEPENDENT_AMBULATORY_CARE_PROVIDER_SITE_OTHER): Payer: Medicare Other

## 2016-01-05 DIAGNOSIS — Z23 Encounter for immunization: Secondary | ICD-10-CM | POA: Diagnosis not present

## 2016-01-23 ENCOUNTER — Telehealth: Payer: Self-pay

## 2016-01-23 DIAGNOSIS — I10 Essential (primary) hypertension: Secondary | ICD-10-CM

## 2016-01-23 DIAGNOSIS — D709 Neutropenia, unspecified: Secondary | ICD-10-CM

## 2016-01-23 DIAGNOSIS — E785 Hyperlipidemia, unspecified: Secondary | ICD-10-CM

## 2016-01-23 NOTE — Telephone Encounter (Signed)
Lab orders sent

## 2016-01-24 DIAGNOSIS — I10 Essential (primary) hypertension: Secondary | ICD-10-CM | POA: Diagnosis not present

## 2016-01-24 DIAGNOSIS — E785 Hyperlipidemia, unspecified: Secondary | ICD-10-CM | POA: Diagnosis not present

## 2016-01-24 DIAGNOSIS — D709 Neutropenia, unspecified: Secondary | ICD-10-CM | POA: Diagnosis not present

## 2016-01-24 LAB — CBC WITH DIFFERENTIAL/PLATELET
BASOS ABS: 66 {cells}/uL (ref 0–200)
Basophils Relative: 2 %
EOS PCT: 3 %
Eosinophils Absolute: 99 cells/uL (ref 15–500)
HCT: 40.6 % (ref 38.5–50.0)
HEMOGLOBIN: 13.3 g/dL (ref 13.2–17.1)
LYMPHS ABS: 1254 {cells}/uL (ref 850–3900)
Lymphocytes Relative: 38 %
MCH: 22.9 pg — AB (ref 27.0–33.0)
MCHC: 32.8 g/dL (ref 32.0–36.0)
MCV: 69.9 fL — ABNORMAL LOW (ref 80.0–100.0)
Monocytes Absolute: 264 cells/uL (ref 200–950)
Monocytes Relative: 8 %
NEUTROS ABS: 1617 {cells}/uL (ref 1500–7800)
Neutrophils Relative %: 49 %
Platelets: 132 10*3/uL — ABNORMAL LOW (ref 140–400)
RBC: 5.81 MIL/uL — ABNORMAL HIGH (ref 4.20–5.80)
RDW: 16.9 % — ABNORMAL HIGH (ref 11.0–15.0)
WBC: 3.3 10*3/uL — ABNORMAL LOW (ref 3.8–10.8)

## 2016-01-24 LAB — BASIC METABOLIC PANEL
BUN: 10 mg/dL (ref 7–25)
CHLORIDE: 104 mmol/L (ref 98–110)
CO2: 28 mmol/L (ref 20–31)
Calcium: 8.8 mg/dL (ref 8.6–10.3)
Creat: 1.27 mg/dL — ABNORMAL HIGH (ref 0.70–1.25)
Glucose, Bld: 83 mg/dL (ref 65–99)
POTASSIUM: 4.4 mmol/L (ref 3.5–5.3)
Sodium: 139 mmol/L (ref 135–146)

## 2016-01-24 LAB — LIPID PANEL
CHOL/HDL RATIO: 2.8 ratio (ref <=5.0)
CHOLESTEROL: 142 mg/dL (ref 125–200)
HDL: 50 mg/dL (ref >=40)
LDL Cholesterol: 75 mg/dL (ref <130)
TRIGLYCERIDES: 84 mg/dL (ref <150)
VLDL: 17 mg/dL (ref <30)

## 2016-01-26 ENCOUNTER — Ambulatory Visit (INDEPENDENT_AMBULATORY_CARE_PROVIDER_SITE_OTHER): Payer: Medicare Other | Admitting: Family Medicine

## 2016-01-26 ENCOUNTER — Other Ambulatory Visit: Payer: Self-pay

## 2016-01-26 ENCOUNTER — Encounter: Payer: Self-pay | Admitting: Family Medicine

## 2016-01-26 VITALS — BP 134/82 | HR 66 | Ht 73.0 in | Wt 178.0 lb

## 2016-01-26 DIAGNOSIS — Z0000 Encounter for general adult medical examination without abnormal findings: Secondary | ICD-10-CM

## 2016-01-26 DIAGNOSIS — Z23 Encounter for immunization: Secondary | ICD-10-CM

## 2016-01-26 DIAGNOSIS — E559 Vitamin D deficiency, unspecified: Secondary | ICD-10-CM

## 2016-01-26 DIAGNOSIS — I10 Essential (primary) hypertension: Secondary | ICD-10-CM | POA: Diagnosis not present

## 2016-01-26 DIAGNOSIS — E785 Hyperlipidemia, unspecified: Secondary | ICD-10-CM

## 2016-01-26 LAB — POCT URINALYSIS DIPSTICK
Bilirubin, UA: NEGATIVE
Blood, UA: NEGATIVE
GLUCOSE UA: NEGATIVE
KETONES UA: NEGATIVE
LEUKOCYTES UA: NEGATIVE
Nitrite, UA: NEGATIVE
Protein, UA: NEGATIVE
SPEC GRAV UA: 1.01
UROBILINOGEN UA: 0.2
pH, UA: 7

## 2016-01-26 NOTE — Patient Instructions (Addendum)
F/u in 5 month, call if you need me before  EXCELLENT labs   No changes in medication  Prevnar today  EKG today   Non fast chem 7 ,   vit D and cBC in 5 month  Thank you  for choosing [facility]. We consider it a privelige to serve you.  Delivering excellent health care in a caring and  compassionate way is our goal.  Partnering with you,  so that together we can achieve this goal is our strategy.

## 2016-01-26 NOTE — Progress Notes (Signed)
Subjective:    Mark Sandoval Hy is a 65 y.o. male who presents for a welcome to Medicare exam.   Cardiac risk factors: hypertension and male gender. EKG: NSR, no ischemia , no LVH Urinalysis: normal Depression Screen (Note: if answer to either of the following is "Yes", a more complete depression screening is indicated)  Q1: Over the past two weeks, have you felt down, depressed or hopeless? no Q2: Over the past two weeks, have you felt little interest or pleasure in doing things? no  Activities of Daily Living In your present state of health, do you have any difficulty performing the following activities?:  Preparing food and eating?: No Bathing yourself: No Getting dressed: No Using the toilet:No Moving around from place to place: No In the past year have you fallen or had a near fall?:No  Current exercise habits: The patient does not participate in regular exercise at present.  Dietary issues discussed: currently vegan   Hearing difficulties: No Safe in current home environment: yes  The following portions of the patient's history were reviewed and updated as appropriate: allergies, current medications, past family history, past medical history, past social history, past surgical history and problem list.  Review of Systems Pertinent items are noted in HPI.    Objective:     BP 134/82   Pulse 66   Ht 6\' 1"  (1.854 m)   Wt 178 lb (80.7 kg)   SpO2 100%   BMI 23.48 kg/m   Vision by Snellen chart: right eye:20/20, left eye:20/20  There is no height or weight on file to calculate BMI. BP 134/82   Pulse 66   Ht 6\' 1"  (1.854 m)   Wt 178 lb (80.7 kg)   SpO2 100%   BMI 23.48 kg/m   General Appearance:    Alert, cooperative, no distress, appears stated age  Head:    Normocephalic, without obvious abnormality, atraumatic  Eyes:    PERRL, conjunctiva/corneas clear, EOM's intact, fundi    benign, both eyes       Ears:    Normal TM's and external ear canals, both ears   Nose:   Nares normal, septum midline, mucosa normal, no drainage    or sinus tenderness  Throat:   Lips, mucosa, and tongue normal; teeth and gums normal  Neck:   Supple, symmetrical, trachea midline, no adenopathy;       thyroid:  No enlargement/tenderness/nodules; no carotid   bruit or JVD  Back:     Symmetric, no curvature, ROM normal, no CVA tenderness  Lungs:     Clear to auscultation bilaterally, respirations unlabored  Chest wall:    No tenderness or deformity  Heart:    Regular rate and rhythm, S1 and S2 normal, no murmur, rub   or gallop  Abdomen:     Soft, non-tender, bowel sounds active all four quadrants,    no masses, no organomegaly  Genitalia:    Normal male without lesion, discharge or tenderness  Rectal:    Normal tone, normal prostate, no masses or tenderness;   guaiac negative stool  Extremities:   Extremities normal, atraumatic, no cyanosis or edema  Pulses:   2+ and symmetric all extremities  Skin:   Skin color, texture, turgor normal, no rashes or lesions  Lymph nodes:   Cervical, supraclavicular, and axillary nodes normal  Neurologic:   CNII-XII intact. Normal strength, sensation and reflexes      throughout       Assessment:  Welcome to Medicare preventive visit Annual exam as documented. Counseling done  re healthy lifestyle involving commitment to 150 minutes exercise per week, heart healthy diet, and attaining healthy weight.The importance of adequate sleep also discussed. Regular seat belt use and home safety, is also discussed. Changes in health habits are decided on by the patient with goals and time frames  set for achieving them. Immunization and cancer screening needs are specifically addressed at this visit.   Need for vaccination with 13-polyvalent pneumococcal conjugate vaccine After obtaining informed consent, the vaccine is  administered by LPN.        Plan:     During the course of the visit the patient was educated and counseled  about appropriate screening and preventive services including:   Pneumococcal vaccine    influenza  Patient Instructions (the written plan) was given to the patient.

## 2016-02-01 ENCOUNTER — Other Ambulatory Visit: Payer: Self-pay

## 2016-02-01 MED ORDER — AMLODIPINE BESYLATE 5 MG PO TABS: ORAL_TABLET | ORAL | 1 refills | Status: DC

## 2016-02-02 DIAGNOSIS — I10 Essential (primary) hypertension: Secondary | ICD-10-CM | POA: Diagnosis not present

## 2016-02-02 DIAGNOSIS — D696 Thrombocytopenia, unspecified: Secondary | ICD-10-CM | POA: Diagnosis not present

## 2016-02-02 DIAGNOSIS — R718 Other abnormality of red blood cells: Secondary | ICD-10-CM | POA: Diagnosis not present

## 2016-02-02 DIAGNOSIS — Z79899 Other long term (current) drug therapy: Secondary | ICD-10-CM | POA: Diagnosis not present

## 2016-02-02 DIAGNOSIS — D709 Neutropenia, unspecified: Secondary | ICD-10-CM | POA: Diagnosis not present

## 2016-02-05 ENCOUNTER — Encounter: Payer: Self-pay | Admitting: Family Medicine

## 2016-02-05 DIAGNOSIS — Z23 Encounter for immunization: Secondary | ICD-10-CM | POA: Insufficient documentation

## 2016-02-05 DIAGNOSIS — Z0000 Encounter for general adult medical examination without abnormal findings: Secondary | ICD-10-CM | POA: Insufficient documentation

## 2016-02-05 NOTE — Assessment & Plan Note (Signed)

## 2016-02-05 NOTE — Assessment & Plan Note (Signed)
After obtaining informed consent, the vaccine is  administered by LPN.  

## 2016-02-09 DIAGNOSIS — M7751 Other enthesopathy of right foot: Secondary | ICD-10-CM | POA: Diagnosis not present

## 2016-02-09 DIAGNOSIS — M25571 Pain in right ankle and joints of right foot: Secondary | ICD-10-CM | POA: Diagnosis not present

## 2016-02-09 DIAGNOSIS — G5761 Lesion of plantar nerve, right lower limb: Secondary | ICD-10-CM | POA: Diagnosis not present

## 2016-02-10 ENCOUNTER — Ambulatory Visit (INDEPENDENT_AMBULATORY_CARE_PROVIDER_SITE_OTHER): Payer: Medicare Other | Admitting: Orthopedic Surgery

## 2016-02-10 ENCOUNTER — Ambulatory Visit: Payer: Medicare Other

## 2016-02-10 ENCOUNTER — Ambulatory Visit (INDEPENDENT_AMBULATORY_CARE_PROVIDER_SITE_OTHER): Payer: Medicare Other

## 2016-02-10 ENCOUNTER — Encounter: Payer: Self-pay | Admitting: Orthopedic Surgery

## 2016-02-10 VITALS — BP 126/81 | HR 68 | Wt 178.0 lb

## 2016-02-10 DIAGNOSIS — G540 Brachial plexus disorders: Secondary | ICD-10-CM

## 2016-02-10 DIAGNOSIS — M170 Bilateral primary osteoarthritis of knee: Secondary | ICD-10-CM

## 2016-02-10 DIAGNOSIS — M25561 Pain in right knee: Secondary | ICD-10-CM | POA: Diagnosis not present

## 2016-02-10 DIAGNOSIS — M25562 Pain in left knee: Secondary | ICD-10-CM

## 2016-02-10 DIAGNOSIS — G8929 Other chronic pain: Secondary | ICD-10-CM

## 2016-02-10 DIAGNOSIS — M5440 Lumbago with sciatica, unspecified side: Secondary | ICD-10-CM

## 2016-02-10 DIAGNOSIS — M7712 Lateral epicondylitis, left elbow: Secondary | ICD-10-CM

## 2016-02-10 DIAGNOSIS — M25552 Pain in left hip: Secondary | ICD-10-CM | POA: Diagnosis not present

## 2016-02-10 DIAGNOSIS — S76012A Strain of muscle, fascia and tendon of left hip, initial encounter: Secondary | ICD-10-CM

## 2016-02-10 DIAGNOSIS — M7711 Lateral epicondylitis, right elbow: Secondary | ICD-10-CM

## 2016-02-10 NOTE — Progress Notes (Signed)
Patient ID: Mark Sandoval, male   DOB: 07-Mar-1951, 66 y.o.   MRN: [id number]  Chief Complaint  Patient presents with  . Back Pain    RIGHT SIDED THORACIC BACK PAIN  . Back Pain    LUMBAR, INVOLVING LEFT HIP  . Knee Pain    BILATERAL  . Elbow Problem    BILATERAL TENNIS ELBOW    HPI Mark Sandoval is a 65 y.o. male.   Problem #1 bilateral knee pain Problem #2 bilateral elbow pain Problem #3 pain left anterior hip Problem #4 and main problem positional weakness and sharp pain right upper extremity  Problem #1 bilateral knee pain status post arthroscopy left knee with torn medial meniscectomy partial medial meniscectomy was done in 2002. Complains of bilateral mild aching knee pain which does not prevent him from playing golf for doing any activities. No swelling with activity  Problem #2 bilateral lateral elbow pain with a history of long-standing tennis elbow controlled with bracing  Problem #3 the patient injured his left hip running several years ago. He felt something pop in his hip and now has mild discomfort and weakness with hip flexion  Problem #4 painful periscapular and upper arm right upper extremity when the patient is leaning forward with his arm in abduction slight flexion and internal rotation. 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 It also affects him when he is walking. He can plague off with no discomfort. He has no weakness in the arm. There is also upper thoracic back pain.     Review of Systems Review of Systems 1. No numbness or tingling 2. No chest pain or shortness of breath   Past Medical History:  Diagnosis Date  . Arthritis of wrist    both   . Dyslipidemia   . GERD (gastroesophageal reflux disease)   . History of seasonal allergies   . Hypertension   . Leukopenia   . Lump or mass in breast    benign  . Thrombocytopenia ([place])     Past Surgical History:  Procedure Laterality Date  . athroscopy of left  knee  2002  . CHOLECYSTECTOMY  2007  . removal of benign left breast lump  09/06/07    Social History Social History  Substance Use Topics  . Smoking status: Never Smoker  . Smokeless tobacco: Never Used  . Alcohol use 0.0 oz/week     Comment: occasional    Allergies  Allergen Reactions  . Compazine   . Prochlorperazine Edisylate     Current Meds  Medication Sig  . amLODipine (NORVASC) 5 MG tablet TAKE 1 TABLET (5 MG TOTAL) BY MOUTH DAILY.  [name] aspirin (RA ASPIRIN EC ADULT LOW ST) 81 MG EC tablet Take 81 mg by mouth daily.        Physical Exam Physical Exam BP 126/81   Pulse 68   Wt 178 lb (80.7 kg)   BMI 23.48 kg/m   Gen. appearance. The patient is well-developed and well-nourished, grooming and hygiene are normal. There are no gross congenital abnormalities  The patient is alert and oriented to person place and time  Mood and affect are normal  Ambulation NORMAL  Examination reveals the following: Left and right knee examination  Right and left Knee have full range of motion. He has mild tenderness over the medial femoral condyle at extreme flexion.  He has mild crepitance in the patellofemoral joint with mild discomfort. The anterior cruciate ligament and PCL are  stable to collateral ligaments are normal.  His McMurray sign are negative in both knees  Left hip flexion weakness otherwise normal range of motion in both hips right hip flexion normal  Bilateral shoulder exam shows no instability normal strength in his rotator cuff and negative impingement signs  Bilateral elbow exam showed normal range of motion of the elbow no tenderness on the lateral epicondyles and painless wrist extension test  Data Reviewed X-ray pelvis shows normal hips and pelvis  X-ray lumbar spine shows normal lumbar spine  X-ray both knees show mild arthritis medial compartment  Assessment    The patient has some type of weird syndrome going on in his upper extremity on the  right  He has osteoarthritis in both knees which is mild and can be managed symptomatically He has an old left hip flexor tear which can be managed with strengthening exercises He has bilateral tennis elbow which is stable we recommend super 7 exercises  Differential diagnosis  Encounter Diagnoses  Name Primary?  . Chronic pain of right knee Yes  . Chronic pain of left knee   . Chronic bilateral low back pain with sciatica, sciatica laterality unspecified   . Left hip pain   . Primary osteoarthritis of knees, bilateral   . Bilateral tennis elbow   . Strain of flexor muscle of left hip, initial encounter   . Neuropathy of right brachial plexus   . Thoracic outlet syndrome          Plan    EMG nerve conduction study rule out brachial plexopathy, thoracic outlet syndrome, quadrilateral space syndrome, cervical disc.       Arther Abbott 02/10/2016, 12:12 PM

## 2016-02-13 ENCOUNTER — Other Ambulatory Visit: Payer: Self-pay | Admitting: *Deleted

## 2016-02-13 NOTE — Addendum Note (Signed)
Addended by: Baldomero Lamy B on: 02/13/2016 05:12 PM   Modules accepted: Orders

## 2016-02-15 ENCOUNTER — Encounter: Payer: Self-pay | Admitting: *Deleted

## 2016-02-17 ENCOUNTER — Telehealth (INDEPENDENT_AMBULATORY_CARE_PROVIDER_SITE_OTHER): Payer: Self-pay | Admitting: *Deleted

## 2016-02-17 ENCOUNTER — Encounter (INDEPENDENT_AMBULATORY_CARE_PROVIDER_SITE_OTHER): Payer: Self-pay | Admitting: *Deleted

## 2016-02-17 NOTE — Telephone Encounter (Signed)
Patient needs trilyte 

## 2016-02-20 MED ORDER — PEG 3350-KCL-NA BICARB-NACL 420 G PO SOLR: 4000.0000 mL | Freq: Once | ORAL | 0 refills | Status: AC

## 2016-03-02 ENCOUNTER — Telehealth (INDEPENDENT_AMBULATORY_CARE_PROVIDER_SITE_OTHER): Payer: Self-pay | Admitting: *Deleted

## 2016-03-02 DIAGNOSIS — N400 Enlarged prostate without lower urinary tract symptoms: Secondary | ICD-10-CM | POA: Diagnosis not present

## 2016-03-02 DIAGNOSIS — N281 Cyst of kidney, acquired: Secondary | ICD-10-CM | POA: Diagnosis not present

## 2016-03-02 NOTE — Telephone Encounter (Signed)
agree

## 2016-03-02 NOTE — Telephone Encounter (Signed)
Referring MD/PCP: simpson   Procedure: tcs  Reason/Indication:  screening  Has patient had this procedure before?  Yes, 10 yrs ao  If so, when, by whom and where?    Is there a family history of colon cancer?  no  Who?  What age when diagnosed?    Is patient diabetic?   borderline      Does patient have prosthetic heart valve or mechanical valve?  no  Do you have a pacemaker?  no  Has patient ever had endocarditis? no  Has patient had joint replacement within last 12 months?  no  Does patient tend to be constipated or take laxatives? no  Does patient have a history of alcohol/drug use?  no  Is patient on Coumadin, Plavix and/or Aspirin? yes  Medications: asa 81 mg daily,m amlodipine 5 mg daily, one ad ay vit, iron daily  Allergies: see epic  Medication Adjustment: asa 2 days, iron 10 days  Procedure date & time: 03/29/16 at 1030

## 2016-03-29 ENCOUNTER — Encounter (HOSPITAL_COMMUNITY): Payer: Self-pay | Admitting: *Deleted

## 2016-03-29 ENCOUNTER — Encounter (HOSPITAL_COMMUNITY)
Admission: RE | Disposition: A | Discharge status: Home / Self Care | Payer: Self-pay | Source: Ambulatory Visit | Attending: Internal Medicine

## 2016-03-29 ENCOUNTER — Ambulatory Visit (HOSPITAL_COMMUNITY)
Admission: RE | Admit: 2016-03-29 | Discharge: 2016-03-29 | Disposition: A | Discharge status: Home / Self Care | Payer: Medicare Other | Source: Ambulatory Visit | Attending: Internal Medicine | Admitting: Internal Medicine

## 2016-03-29 DIAGNOSIS — Z1211 Encounter for screening for malignant neoplasm of colon: Secondary | ICD-10-CM | POA: Diagnosis not present

## 2016-03-29 DIAGNOSIS — K219 Gastro-esophageal reflux disease without esophagitis: Secondary | ICD-10-CM | POA: Diagnosis not present

## 2016-03-29 DIAGNOSIS — I10 Essential (primary) hypertension: Secondary | ICD-10-CM | POA: Diagnosis not present

## 2016-03-29 DIAGNOSIS — K644 Residual hemorrhoidal skin tags: Secondary | ICD-10-CM | POA: Insufficient documentation

## 2016-03-29 DIAGNOSIS — Z7982 Long term (current) use of aspirin: Secondary | ICD-10-CM | POA: Insufficient documentation

## 2016-03-29 DIAGNOSIS — Z79899 Other long term (current) drug therapy: Secondary | ICD-10-CM | POA: Insufficient documentation

## 2016-03-29 DIAGNOSIS — E785 Hyperlipidemia, unspecified: Secondary | ICD-10-CM | POA: Insufficient documentation

## 2016-03-29 DIAGNOSIS — N6320 Unspecified lump in the left breast, unspecified quadrant: Secondary | ICD-10-CM | POA: Insufficient documentation

## 2016-03-29 DIAGNOSIS — K5730 Diverticulosis of large intestine without perforation or abscess without bleeding: Secondary | ICD-10-CM | POA: Diagnosis not present

## 2016-03-29 HISTORY — PX: COLONOSCOPY: SHX5424

## 2016-03-29 SURGERY — COLONOSCOPY
Anesthesia: Moderate Sedation

## 2016-03-29 MED ORDER — SODIUM CHLORIDE 0.9 % IV SOLN
INTRAVENOUS | Status: DC
  Administered 2016-03-29: 1000 mL via INTRAVENOUS

## 2016-03-29 MED ORDER — MIDAZOLAM HCL 5 MG/5ML IJ SOLN
INTRAMUSCULAR | Status: DC | PRN
  Administered 2016-03-29 (×2): 2 mg via INTRAVENOUS

## 2016-03-29 MED ORDER — MIDAZOLAM HCL 5 MG/5ML IJ SOLN
INTRAMUSCULAR | Status: AC
  Filled 2016-03-29: qty 10

## 2016-03-29 MED ORDER — MEPERIDINE HCL 50 MG/ML IJ SOLN
INTRAMUSCULAR | Status: DC | PRN
  Administered 2016-03-29 (×2): 25 mg via INTRAVENOUS

## 2016-03-29 MED ORDER — MEPERIDINE HCL 50 MG/ML IJ SOLN
INTRAMUSCULAR | Status: AC
  Filled 2016-03-29: qty 1

## 2016-03-29 NOTE — Discharge Instructions (Signed)
Resume usual medications and high fiber diet. No driving for 24 hours. Next screening exam in 10 years.   Colonoscopy, Adult, Care After This sheet gives you information about how to care for yourself after your procedure. Your doctor may also give you more specific instructions. If you have problems or questions, call your doctor. Follow these instructions at home: General instructions  For the first 24 hours after the procedure:  Do not drive or use machinery.  Do not sign important documents.  Do not drink alcohol.  Do your daily activities more slowly than normal.  Eat foods that are soft and easy to digest.  Rest often.  Take over-the-counter or prescription medicines only as told by your doctor.  It is up to you to get the results of your procedure. Ask your doctor, or the department performing the procedure, when your results will be ready. To help cramping and bloating:  Try walking around.  Put heat on your belly (abdomen) as told by your doctor. Use a heat source that your doctor recommends, such as a moist heat pack or a heating pad.  Put a towel between your skin and the heat source.  Leave the heat on for 20-30 minutes.  Remove the heat if your skin turns bright red. This is especially important if you cannot feel pain, heat, or cold. You can get burned. Eating and drinking  Drink enough fluid to keep your pee (urine) clear or pale yellow.  Return to your normal diet as told by your doctor. Avoid heavy or fried foods that are hard to digest.  Avoid drinking alcohol for as long as told by your doctor. Contact a doctor if:  You have blood in your poop (stool) 2-3 days after the procedure. Get help right away if:  You have more than a small amount of blood in your poop.  You see large clumps of tissue (blood clots) in your poop.  Your belly is swollen.  You feel sick to your stomach (nauseous).  You throw up (vomit).  You have a fever.  You have  belly pain that gets worse, and medicine does not help your pain. This information is not intended to replace advice given to you by your health care provider. Make sure you discuss any questions you have with your health care provider. Document Released: 05/05/2010 Document Revised: 12/26/2015 Document Reviewed: 12/26/2015 Elsevier Interactive Patient Education  2017 [employer]    Hemorrhoids Hemorrhoids are swollen veins in and around the rectum or anus. There are two types of hemorrhoids:  Internal hemorrhoids. These occur in the veins that are just inside the rectum. They may poke through to the outside and become irritated and painful.  External hemorrhoids. These occur in the veins that are outside of the anus and can be felt as a painful swelling or hard lump near the anus. Most hemorrhoids do not cause serious problems, and they can be managed with home treatments such as diet and lifestyle changes. If home treatments do not help your symptoms, procedures can be done to shrink or remove the hemorrhoids. What are the causes? This condition is caused by increased pressure in the anal area. This pressure may result from various things, including:  Constipation.  Straining to have a bowel movement.  Diarrhea.  Pregnancy.  Obesity.  Sitting for long periods of time.  Heavy lifting or other activity that causes you to strain.  Anal sex. What are the signs or symptoms? Symptoms of this condition  include:  Pain.  Anal itching or irritation.  Rectal bleeding.  Leakage of stool (feces).  Anal swelling.  One or more lumps around the anus. How is this diagnosed? This condition can often be diagnosed through a visual exam. Other exams or tests may also be done, such as:  Examination of the rectal area with a gloved hand (digital rectal exam).  Examination of the anal canal using a small tube (anoscope).  A blood test, if you have lost a significant amount of  blood.  A test to look inside the colon (sigmoidoscopy or colonoscopy). How is this treated? This condition can usually be treated at home. However, various procedures may be done if dietary changes, lifestyle changes, and other home treatments do not help your symptoms. These procedures can help make the hemorrhoids smaller or remove them completely. Some of these procedures involve surgery, and others do not. Common procedures include:  Rubber band ligation. Rubber bands are placed at the base of the hemorrhoids to cut off the blood supply to them.  Sclerotherapy. Medicine is injected into the hemorrhoids to shrink them.  Infrared coagulation. A type of light energy is used to get rid of the hemorrhoids.  Hemorrhoidectomy surgery. The hemorrhoids are surgically removed, and the veins that supply them are tied off.  Stapled hemorrhoidopexy surgery. A circular stapling device is used to remove the hemorrhoids and use staples to cut off the blood supply to them. Follow these instructions at home: Eating and drinking  Eat foods that have a lot of fiber in them, such as whole grains, beans, nuts, fruits, and vegetables. Ask your health care provider about taking products that have added fiber (fiber supplements).  Drink enough fluid to keep your urine clear or pale yellow. Managing pain and swelling  Take warm sitz baths for 20 minutes, 3-4 times a day to ease pain and discomfort.  If directed, apply ice to the affected area. Using ice packs between sitz baths may be helpful.  Put ice in a plastic bag.  Place a towel between your skin and the bag.  Leave the ice on for 20 minutes, 2-3 times a day. General instructions  Take over-the-counter and prescription medicines only as told by your health care provider.  Use medicated creams or suppositories as told.  Exercise regularly.  Go to the bathroom when you have the urge to have a bowel movement. Do not wait.  Avoid straining to  have bowel movements.  Keep the anal area dry and clean. Use wet toilet paper or moist towelettes after a bowel movement.  Do not sit on the toilet for long periods of time. This increases blood pooling and pain. Contact a health care provider if:  You have increasing pain and swelling that are not controlled by treatment or medicine.  You have uncontrolled bleeding.  You have difficulty having a bowel movement, or you are unable to have a bowel movement.  You have pain or inflammation outside the area of the hemorrhoids. This information is not intended to replace advice given to you by your health care provider. Make sure you discuss any questions you have with your health care provider. Document Released: 03/30/2000 Document Revised: 08/31/2015 Document Reviewed: 12/15/2014 Elsevier Interactive Patient Education  2017 Elsevier Inc.    Diverticulosis Diverticulosis is the condition that develops when small pouches (diverticula) form in the wall of your colon. Your colon, or large intestine, is where water is absorbed and stool is formed. The pouches form when  the inside layer of your colon pushes through weak spots in the outer layers of your colon. CAUSES  No one knows exactly what causes diverticulosis. RISK FACTORS  Being older than 31. Your risk for this condition increases with age. Diverticulosis is rare in people younger than 40 years. By age 52, almost everyone has it.  Eating a low-fiber diet.  Being frequently constipated.  Being overweight.  Not getting enough exercise.  Smoking.  Taking over-the-counter pain medicines, like aspirin and ibuprofen. SYMPTOMS  Most people with diverticulosis do not have symptoms. DIAGNOSIS  Because diverticulosis often has no symptoms, health care providers often discover the condition during an exam for other colon problems. In many cases, a health care provider will diagnose diverticulosis while using a flexible scope to examine  the colon (colonoscopy). TREATMENT  If you have never developed an infection related to diverticulosis, you may not need treatment. If you have had an infection before, treatment may include:  Eating more fruits, vegetables, and grains.  Taking a fiber supplement.  Taking a live bacteria supplement (probiotic).  Taking medicine to relax your colon. HOME CARE INSTRUCTIONS   Drink at least 6-8 glasses of water each day to prevent constipation.  Try not to strain when you have a bowel movement.  Keep all follow-up appointments. If you have had an infection before:  Increase the fiber in your diet as directed by your health care provider or dietitian.  Take a dietary fiber supplement if your health care provider approves.  Only take medicines as directed by your health care provider. SEEK MEDICAL CARE IF:   You have abdominal pain.  You have bloating.  You have cramps.  You have not gone to the bathroom in 3 days. SEEK IMMEDIATE MEDICAL CARE IF:   Your pain gets worse.  Yourbloating becomes very bad.  You have a fever or chills, and your symptoms suddenly get worse.  You begin vomiting.  You have bowel movements that are bloody or black. MAKE SURE YOU:  Understand these instructions.  Will watch your condition.  Will get help right away if you are not doing well or get worse. This information is not intended to replace advice given to you by your health care provider. Make sure you discuss any questions you have with your health care provider. Document Released: 12/29/2003 Document Revised: 04/07/2013 Document Reviewed: 02/25/2013 Elsevier Interactive Patient Education  2017 [employer].

## 2016-03-29 NOTE — H&P (Signed)
Mark Sandoval is an 65 y.o. male.   Chief Complaint: Patient is here for colonoscopy. 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 Last exam was in September 2007. He denies abdominal pain change in bowel habits or rectal bleeding. He feels prep aggravated his hemorrhoids. He has lost about 18 pounds since he went on vegen diet. Family History is negative for CRC.  Past Medical History:  Diagnosis Date  . Arthritis of wrist    both   . Dyslipidemia   . GERD (gastroesophageal reflux disease)   . History of seasonal allergies   . Hypertension   . Leukopenia   . Lump or mass in breast    benign  . Thrombocytopenia ([place])     Past Surgical History:  Procedure Laterality Date  . athroscopy of left knee  2002  . CHOLECYSTECTOMY  2007  . removal of benign left breast lump  09/06/07    Family History  Problem Relation Age of Onset  . Cancer Mother     hematologic cancer   . Hypertension Mother   . Alcohol abuse Father    Social History:  reports that he has never smoked. He has never used smokeless tobacco. He reports that he drinks alcohol. He reports that he does not use drugs.  Allergies:  Allergies  Allergen Reactions  . Compazine Other (See Comments)    tongue movement disorders/MD in ER stated he would have expired    Medications Prior to Admission  Medication Sig Dispense Refill  . amLODipine (NORVASC) 5 MG tablet TAKE 1 TABLET (5 MG TOTAL) BY MOUTH DAILY. 90 tablet 1  . ferrous sulfate (KP FERROUS SULFATE) 325 (65 FE) MG tablet Take 325 mg by mouth daily with breakfast.    . Multiple Vitamin (MULTIVITAMIN WITH MINERALS) TABS tablet Take 1 tablet by mouth daily.    . polyethylene glycol-electrolytes (NULYTELY/GOLYTELY) 420 g solution Take 4,000 mLs by mouth as directed.  0  . aspirin (RA ASPIRIN EC ADULT LOW ST) 81 MG EC tablet Take 81 mg by mouth daily.        No results found for this or any previous visit (from  the past 48 hour(s)). No results found.  ROS  Blood pressure 122/82, pulse 92, temperature 98.4 F (36.9 C), temperature source Oral, resp. rate 18, height 6\' 1"  (1.854 m), weight 171 lb (77.6 kg), SpO2 99 %. Physical Exam  Constitutional: He appears well-developed and well-nourished.  HENT:  Mouth/Throat: Oropharynx is clear and moist.  Eyes: Conjunctivae are normal. No scleral icterus.  Neck: No thyromegaly present.  Cardiovascular: Normal rate, regular rhythm and normal heart sounds.   Respiratory: Effort normal and breath sounds normal.  GI: Soft. He exhibits no distension and no mass. There is no tenderness.  Musculoskeletal: He exhibits no edema.  Lymphadenopathy:    He has no cervical adenopathy.  Neurological: He is alert.  Skin: Skin is warm and dry.     Assessment/Plan Average risk screening colonoscopy.  Hildred Laser, MD 03/29/2016, 10:30 AM

## 2016-03-29 NOTE — Op Note (Signed)
[facility] Patient Name: Mark Sandoval Procedure Date: 03/29/2016 10:26 AM MRN: [id number] Date of Birth: [birth year] Attending MD: Hildred Laser , MD CSN: [id number] Age: 65 Admit Type: Outpatient Procedure:                Colonoscopy Indications:              Screening for colorectal malignant neoplasm Providers:                Hildred Laser, MD, Charlyne Petrin RN, RN, Sherlyn Lees, Technician Referring MD:             Norwood Levo. Simpson MD, MD Medicines:                Meperidine 50 mg IV, Midazolam 4 mg IV Complications:            No immediate complications. Estimated Blood Loss:     Estimated blood loss: none. Procedure:                Pre-Anesthesia Assessment:                           - Prior to the procedure, a History and Physical                            was performed, and patient medications and                            allergies were reviewed. The patient's tolerance of                            previous anesthesia was also reviewed. The risks                            and benefits of the procedure and the sedation                            options and risks were discussed with the patient.                            All questions were answered, and informed consent                            was obtained. Prior Anticoagulants: The patient                            last took aspirin 3 days prior to the procedure.                            ASA Grade Assessment: II - A patient with mild                            systemic disease. After reviewing the risks and  benefits, the patient was deemed in satisfactory                            condition to undergo the procedure.                           After obtaining informed consent, the colonoscope                            was passed under direct vision. Throughout the                            procedure, the patient's blood pressure, pulse, and                             oxygen saturations were monitored continuously. The                            [id number]) was introduced through the                            anus and advanced to the the cecum, identified by                            appendiceal orifice and ileocecal valve. The                            colonoscopy was performed without difficulty. The                            patient tolerated the procedure well. The quality                            of the bowel preparation was excellent. The                            ileocecal valve, appendiceal orifice, and rectum                            were photographed. Scope In: 10:35:30 AM Scope Out: 11:00:54 AM Scope Withdrawal Time: 0 hours 9 minutes 19 seconds  Total Procedure Duration: 0 hours 25 minutes 24 seconds  Findings:      The perianal and digital rectal examinations were normal.      Scattered medium-mouthed diverticula were found in the sigmoid colon,       descending colon and ascending colon.      External hemorrhoids were found during retroflexion. The hemorrhoids       were medium-sized. Impression:               - Diverticulosis in the sigmoid colon, in the                            descending colon and in the ascending colon.                           -  External hemorrhoids.                           - No specimens collected. Moderate Sedation:      Moderate (conscious) sedation was administered by the endoscopy nurse       and supervised by the endoscopist. The following parameters were       monitored: oxygen saturation, heart rate, blood pressure, CO2       capnography and response to care. Total physician intraservice time was       25 minutes. Recommendation:           - Patient has a contact number available for                            emergencies. The signs and symptoms of potential                            delayed complications were discussed with the                             patient. Return to normal activities tomorrow.                            Written discharge instructions were provided to the                            patient.                           - High fiber diet today.                           - Continue present medications.                           - Repeat colonoscopy in 10 years for screening                            purposes. Procedure Code(s):        --- Professional ---                           [id number], Colonoscopy, flexible; diagnostic, including                            collection of specimen(s) by brushing or washing,                            when performed (separate procedure)                           99152, Moderate sedation services provided by the                            same physician or other qualified health care  professional performing the diagnostic or                            therapeutic service that the sedation supports,                            requiring the presence of an independent trained                            observer to assist in the monitoring of the                            patient's level of consciousness and physiological                            status; initial 15 minutes of intraservice time,                            patient age 24 years or older                           [phone], Moderate sedation services; each additional                            15 minutes intraservice time Diagnosis Code(s):        --- Professional ---                           Z12.11, Encounter for screening for malignant                            neoplasm of colon                           K64.4, Residual hemorrhoidal skin tags                           K57.30, Diverticulosis of large intestine without                            perforation or abscess without bleeding CPT copyright 2016 American Medical Association. All rights reserved. The codes documented in this report are  preliminary and upon coder review may  be revised to meet current compliance requirements. Hildred Laser, MD Hildred Laser, MD 03/29/2016 11:06:43 AM This report has been signed electronically. Number of Addenda: 0

## 2016-04-02 ENCOUNTER — Encounter (HOSPITAL_COMMUNITY): Payer: Self-pay | Admitting: Internal Medicine

## 2016-04-05 ENCOUNTER — Encounter: Payer: Self-pay | Admitting: Orthopedic Surgery

## 2016-04-05 DIAGNOSIS — G629 Polyneuropathy, unspecified: Secondary | ICD-10-CM | POA: Diagnosis not present

## 2016-04-05 DIAGNOSIS — G5600 Carpal tunnel syndrome, unspecified upper limb: Secondary | ICD-10-CM | POA: Diagnosis not present

## 2016-04-17 ENCOUNTER — Encounter: Payer: Self-pay | Admitting: Orthopedic Surgery

## 2016-04-30 ENCOUNTER — Ambulatory Visit (INDEPENDENT_AMBULATORY_CARE_PROVIDER_SITE_OTHER): Payer: Medicare Other | Admitting: Orthopedic Surgery

## 2016-04-30 ENCOUNTER — Encounter: Payer: Self-pay | Admitting: Orthopedic Surgery

## 2016-04-30 DIAGNOSIS — G540 Brachial plexus disorders: Secondary | ICD-10-CM

## 2016-04-30 DIAGNOSIS — M5104 Intervertebral disc disorders with myelopathy, thoracic region: Secondary | ICD-10-CM

## 2016-04-30 DIAGNOSIS — S76012D Strain of muscle, fascia and tendon of left hip, subsequent encounter: Secondary | ICD-10-CM

## 2016-04-30 DIAGNOSIS — M4302 Spondylolysis, cervical region: Secondary | ICD-10-CM

## 2016-04-30 NOTE — Progress Notes (Signed)
Patient ID: Mark Sandoval, male   DOB: 09/16/1950, 66 y.o.   MRN: [id number]  Chief Complaint  Patient presents with  . Follow-up    REVIEW NCS, EEG    HPI Mark Sandoval is a 66 y.o. male.   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  DIAGNOSIS  Cervical spondylosis  PLAN(RISK)   Although the nerve study shows no cervical or scapular related neurologic abnormality right median mononeuropathy does not correspond with the patient's symptoms  Therefore patient will need MRI of his cervical spine.

## 2016-05-01 ENCOUNTER — Encounter: Payer: Self-pay | Admitting: *Deleted

## 2016-05-01 NOTE — Telephone Encounter (Signed)
This encounter was created in error - please disregard.

## 2016-05-03 ENCOUNTER — Encounter: Payer: Self-pay | Admitting: Family Medicine

## 2016-05-03 DIAGNOSIS — K5730 Diverticulosis of large intestine without perforation or abscess without bleeding: Secondary | ICD-10-CM | POA: Insufficient documentation

## 2016-05-10 ENCOUNTER — Ambulatory Visit (HOSPITAL_COMMUNITY): Payer: Medicare Other | Attending: Orthopedic Surgery | Admitting: Physical Therapy

## 2016-05-10 ENCOUNTER — Ambulatory Visit (HOSPITAL_COMMUNITY)
Admission: RE | Admit: 2016-05-10 | Discharge: 2016-05-10 | Disposition: A | Discharge status: Home / Self Care | Payer: Medicare Other | Source: Ambulatory Visit | Attending: Orthopedic Surgery | Admitting: Orthopedic Surgery

## 2016-05-10 DIAGNOSIS — M4802 Spinal stenosis, cervical region: Secondary | ICD-10-CM | POA: Insufficient documentation

## 2016-05-10 DIAGNOSIS — M2578 Osteophyte, vertebrae: Secondary | ICD-10-CM | POA: Insufficient documentation

## 2016-05-10 DIAGNOSIS — M5104 Intervertebral disc disorders with myelopathy, thoracic region: Secondary | ICD-10-CM

## 2016-05-10 DIAGNOSIS — R29898 Other symptoms and signs involving the musculoskeletal system: Secondary | ICD-10-CM

## 2016-05-10 DIAGNOSIS — M5124 Other intervertebral disc displacement, thoracic region: Secondary | ICD-10-CM | POA: Insufficient documentation

## 2016-05-10 DIAGNOSIS — M47894 Other spondylosis, thoracic region: Secondary | ICD-10-CM | POA: Diagnosis not present

## 2016-05-10 DIAGNOSIS — M47814 Spondylosis without myelopathy or radiculopathy, thoracic region: Secondary | ICD-10-CM | POA: Diagnosis not present

## 2016-05-10 DIAGNOSIS — M5030 Other cervical disc degeneration, unspecified cervical region: Secondary | ICD-10-CM | POA: Insufficient documentation

## 2016-05-10 DIAGNOSIS — M25651 Stiffness of right hip, not elsewhere classified: Secondary | ICD-10-CM | POA: Diagnosis not present

## 2016-05-10 DIAGNOSIS — M25652 Stiffness of left hip, not elsewhere classified: Secondary | ICD-10-CM

## 2016-05-10 DIAGNOSIS — M6281 Muscle weakness (generalized): Secondary | ICD-10-CM

## 2016-05-10 DIAGNOSIS — M4302 Spondylolysis, cervical region: Secondary | ICD-10-CM

## 2016-05-10 DIAGNOSIS — M25552 Pain in left hip: Secondary | ICD-10-CM | POA: Diagnosis not present

## 2016-05-10 NOTE — Therapy (Signed)
[place], [place], [address] Phone: [phone]   Fax:  [phone]  Physical Therapy Evaluation  Patient Details  Name: Mark Sandoval MRN: [id number] Date of Birth: 1950/08/06 Referring Provider: Arther Abbott   Encounter Date: 05/10/2016      PT End of Session - 05/10/16 0910    Visit Number 1   Number of Visits 13   Date for PT Re-Evaluation 05/31/16   Authorization Type Medicare/Aetna Senior Supplement   Authorization Time Period 05/10/16 to 06/21/16   Authorization - Visit Number 1   Authorization - Number of Visits 10   PT Start Time 0815   PT Stop Time 0850  simple evaluation, all goals accomplished for this session    PT Time Calculation (min) 35 min   Activity Tolerance Patient tolerated treatment well   Behavior During Therapy [facility] for tasks assessed/performed      Past Medical History:  Diagnosis Date  . Arthritis of wrist    both   . Dyslipidemia   . GERD (gastroesophageal reflux disease)   . History of seasonal allergies   . Hypertension   . Leukopenia   . Lump or mass in breast    benign  . Thrombocytopenia ([place])     Past Surgical History:  Procedure Laterality Date  . athroscopy of left knee  2002  . CHOLECYSTECTOMY  2007  . COLONOSCOPY N/A 03/29/2016   Procedure: COLONOSCOPY;  Surgeon: Rogene Houston, MD;  [facility];  Service: Endoscopy;  Laterality: N/A;  1030  . removal of benign left breast lump  09/06/07    There were no vitals filed for this visit.       Subjective Assessment - 05/10/16 0818    Subjective Patient reports that the MD told him he has a torn hip flexor; he was racing his grand-daughter (29 years old) and he lost, he feels he injured his hip doing this activity. He felt a pop in the front of his hip and fell to the ground, he could not move due to pain. The injury happened initially about 3 years ago and at that time he did not seek any  treatment besides using a TENS unit, which did help. He has just been living with the discomfort from this injury for several years now. It still stops him from activities, as it can still get very painful and can make it difficult for him to walk.    Pertinent History HTN, history of L knee scope in 2002 (still having issues with this knee)   How long can you sit comfortably? occasionally will bother him, most of the time he is OK    How long can you stand comfortably? fairly unlimited    How long can you walk comfortably? fairly unlimited    Patient Stated Goals reduce pain, be able to run if he needs to, be able to keep up with grandkids better    Currently in Pain? No/denies  at worst, 7-8/10            [facility] PT Assessment - 05/10/16 0001      Assessment   Medical Diagnosis L hip flexor strain    Referring Provider Arther Abbott    Onset Date/Surgical Date --  3 years ago    Next MD Visit Dr. Aline Brochure soon but not scheduled    Prior Therapy none      Balance Screen   Has the patient fallen  in the past 6 months No   Has the patient had a decrease in activity level because of a fear of falling?  No   Is the patient reluctant to leave their home because of a fear of falling?  No     Prior Function   Level of Independence Independent;Independent with basic ADLs;Independent with gait;Independent with transfers   Vocation Retired     Observation/Other Assessments   Observations scour and FABER tests negative B;  moderate tightness noted in B hip flexors   Focus on Therapeutic Outcomes (FOTO)  29% limited      Functional Tests   Functional tests Step down;Sit to Stand;Other     Other:   Other/ Comments double leg lower test scored as good      AROM   Overall AROM Comments moderate tightness noted B hip IR/ER, with L being stiffer than R   hip flexion and extension range appears [facility]      Strength   Right Hip Flexion 4+/5   Right Hip Extension 4+/5   Right Hip External  Rotation  5/5   Right Hip Internal Rotation 5/5   Right Hip ABduction 5/5   Left Hip Flexion 3+/5   Left Hip Extension 4-/5   Left Hip External Rotation 5/5   Left Hip Internal Rotation 5/5   Left Hip ABduction 4-/5   Right Knee Flexion 4+/5   Right Knee Extension 5/5   Left Knee Flexion 4/5   Left Knee Extension 5/5   Right Ankle Dorsiflexion 5/5   Left Ankle Dorsiflexion 5/5     Flexibility   Hamstrings mild limitation    Piriformis mild limitation      Ambulation/Gait   Gait Comments proximal weakness noted, mild ER R hip                           PT Education - 05/10/16 0909    Education provided Yes   Education Details prognosis, HEP, POC    Person(s) Educated Patient   Methods Explanation;Demonstration;Handout   Comprehension Verbalized understanding;Returned demonstration;Need further instruction          PT Short Term Goals - 05/10/16 0915      PT SHORT TERM GOAL #1   Title Patient to experience pain no more than 5/10 at worst in L hip flexor area in order to show improvement of condition and to improve QOL    Time 3   Period Weeks   Status New     PT SHORT TERM GOAL #2   Title Patient to demonstrate bilateral hip IR and ER each being as 45 degrees in order to improve mechanics and reduce stiffness possibly contributing to pain    Time 3   Period Weeks   Status New     PT SHORT TERM GOAL #3   Title Patient to demonstrate correct self-manual technique to L hip flexor area in supine in order to improve self-efficacy in managing condition    Time 3   Period Weeks   Status New     PT SHORT TERM GOAL #4   Title Patient to be independent in correctly and consistently performing targeted HEP, to be updated as appropriate    Time 1   Period Weeks   Status New           PT Long Term Goals - 05/10/16 [id number]      PT LONG TERM GOAL #1   Title  Patient to demonstrate functional strength 5/5 in all tested groups in order to reduce pain  and improve gross mechanics    Time 6   Period Weeks   Status New     PT LONG TERM GOAL #2   Title Patient to experience pain no more than 2/10 at worst in L hip flexor area in order to improve activity performance and QOL    Time 6   Period Weeks   Status New     PT LONG TERM GOAL #3   Title Patient to be able to run 20-37ft at self-selected pace on level surfaces with no pain exacerbation in order to improve QOL and expand activity performance skills    Time 6   Period Weeks   Status New     PT LONG TERM GOAL #4   Title Patient to be participatory in appropriate regular exercise program, at least 20 minutes in duration and at least 4 days per week, in order to maintain functional gains and improve overall health status    Time 6   Period Weeks   Status New               Plan - 05/10/16 0912    Clinical Impression Statement Patient arrives with chronic hip flexor injury that he reports started about 3 years ago when he was racing his grand-daughter; he has had issues with this area since that time, and at worst it can reach 7-8/10 pain and even keep him from standing/walking well. Examination reveals bilateral hip flexor muscle tightness, bilateral gross hip stiffness, functional weakness especially proximally, gait deviation, and reduced tolerance to extended tasks at this time. Suspect possible L hip flexor muscle knotting/spasm however did not assess today due to patient attire, asked patient to wear/bring shorts next session and we will specifically palpate this muscle for impairment. Recommend skilled PT services in order to address functional impairments, reduce pain, and to assist in preventing re-injury of this area in the future.    Rehab Potential Excellent   Clinical Impairments Affecting Rehab Potential (+) motivated to participate with skilled PT services, good overall health status with few co-morbidities; (-) chronic pain/injury, ongoing back pain and L knee pain     PT Frequency 2x / week   PT Duration 6 weeks   PT Treatment/Interventions ADLs/Self Care Home Management;Biofeedback;Cryotherapy;Moist Heat;Gait training;Stair training;Functional mobility training;Therapeutic activities;Therapeutic exercise;Balance training;Neuromuscular re-education;Patient/family education;Manual techniques;Passive range of motion;Dry needling;Energy conservation;Taping   PT Next Visit Plan review HEP and goals;  palpation and manual to L hip flexor; proximal isolated muscle strengthening; hip flexor stretching B; B hip seatbelt mobs    PT Home Exercise Plan Eval: thomas hip flexor stretch; bridges; sidelying hip ABD    Consulted and Agree with Plan of Care Patient      Patient will benefit from skilled therapeutic intervention in order to improve the following deficits and impairments:  Abnormal gait, Improper body mechanics, Pain, Increased muscle spasms, Postural dysfunction, Decreased strength, Hypomobility, Difficulty walking, Impaired flexibility  Visit Diagnosis: Pain in left hip - Plan: PT plan of care cert/re-cert  Muscle weakness (generalized) - Plan: PT plan of care cert/re-cert  Other symptoms and signs involving the musculoskeletal system - Plan: PT plan of care cert/re-cert  Stiffness of left hip, not elsewhere classified - Plan: PT plan of care cert/re-cert  Stiffness of right hip, not elsewhere classified - Plan: PT plan of care cert/re-cert      G-Codes - [id number]  Functional Assessment Tool Used Based on skilled clinical assessment of strength, flexibility, hip mobility, and pain patterns    Functional Limitation Mobility: Walking and moving around   Mobility: Walking and Moving Around Current Status [phone]) At least 20 percent but less than 40 percent impaired, limited or restricted   Mobility: Walking and Moving Around Goal Status [phone]) At least 1 percent but less than 20 percent impaired, limited or restricted       Problem  List Patient Active Problem List   Diagnosis Date Noted  . Diverticulosis of colon without hemorrhage 05/03/2016  . Welcome to Medicare preventive visit 02/05/2016  . Need for vaccination with 13-polyvalent pneumococcal conjugate vaccine 02/05/2016  . Dyslipidemia, goal LDL below 100 01/15/2014  . Carpal tunnel syndrome 02/14/2013  . Leukocytopenia 12/08/2007  . Allergic rhinitis 12/08/2007  . Essential hypertension 11/21/2007    Deniece Ree PT, DPT [facility] [address] [place], [place], [address] Phone: [phone]   Fax:  [phone]  Name: KNUT ZAMANI MRN: [id number] Date of Birth: March 06, 1951

## 2016-05-10 NOTE — Patient Instructions (Signed)
   BRIDGING  While lying on your back, tighten your lower abdominals, squeeze your buttocks and then raise your buttocks off the floor/bed as creating a "Bridge" with your body. Hold approximately 1 second and then lower yourself back down to the table.  Repeat 10 times, twice a day.    HIP ABDUCTION - SIDELYING  While lying on your side, slowly raise up your top leg to the side. Keep your knee straight and maintain your toes pointed forward the entire time. Keep your leg in-line with your body.  The bottom leg can be bent to stabilize your body.  Make sure your hips stay level; do not let them roll backwards.  Repeat 10 repetitions each side, twice a day.     Thomas [employer] on your back with your hips slightly hanging off the table. At this point, lift one leg and pull your knee to your chest. With the other leg, let the leg freely hang off the edge of the table and you should feel a stretch at the front of your hip flexor muscles.  Hold for 30 seconds, then sit back up and switch legs.   Repeat 2-3 times each side, twice a day.

## 2016-05-17 ENCOUNTER — Ambulatory Visit (HOSPITAL_COMMUNITY): Payer: Medicare Other | Attending: Orthopedic Surgery | Admitting: Physical Therapy

## 2016-05-17 DIAGNOSIS — R29898 Other symptoms and signs involving the musculoskeletal system: Secondary | ICD-10-CM

## 2016-05-17 DIAGNOSIS — M25552 Pain in left hip: Secondary | ICD-10-CM | POA: Diagnosis not present

## 2016-05-17 DIAGNOSIS — M6281 Muscle weakness (generalized): Secondary | ICD-10-CM

## 2016-05-17 DIAGNOSIS — M25652 Stiffness of left hip, not elsewhere classified: Secondary | ICD-10-CM

## 2016-05-17 DIAGNOSIS — M25651 Stiffness of right hip, not elsewhere classified: Secondary | ICD-10-CM

## 2016-05-17 NOTE — Patient Instructions (Signed)
   SEATED MARCHING  While seated in a chair, lift up your foot and knee, set it down and then perform on the other leg.   Repeat this alternating movement 10 times, twice a day.     HIP HIKES  While standing up on a step, lower one leg downward towards the floor by tilting your pelvis to the side.   Then return the pelvis/leg back to a leveled position.  Repeat 10-15 times each side, twice a day.  HIP FLEXOR MASSAGE  While laying on your back, you may use a tennis ball/lacrosse ball/pool table ball/etc to perform a massage on your hip flexor muscle.   Press down hard enough on the ball so that you can feel the pressure/mild discomfort but not enough that it is painful.  Roll the ball back and forth along the front of your hip while still applying pressure.  Repeat for 3-5 minutes once a day or as needed for pain.

## 2016-05-17 NOTE — Therapy (Signed)
[place], [place], [address] Phone: [phone]   Fax:  [phone]  Physical Therapy Treatment  Patient Details  Name: VARDELL RODOCKER MRN: [id number] Date of Birth: May 06, 1950 Referring Provider: Arther Abbott   Encounter Date: 05/17/2016      PT End of Session - 05/17/16 1030    Visit Number 2   Number of Visits 13   Date for PT Re-Evaluation 05/31/16   Authorization Type Medicare/Aetna Senior Supplement   Authorization Time Period 05/10/16 to 06/21/16   Authorization - Visit Number 2   Authorization - Number of Visits 10   PT Start Time [phone]  got tied up helping a co-worker    PT Stop Time 1028   PT Time Calculation (min) 34 min   Activity Tolerance Patient tolerated treatment well   Behavior During Therapy [facility] for tasks assessed/performed      Past Medical History:  Diagnosis Date  . Arthritis of wrist    both   . Dyslipidemia   . GERD (gastroesophageal reflux disease)   . History of seasonal allergies   . Hypertension   . Leukopenia   . Lump or mass in breast    benign  . Thrombocytopenia ([place])     Past Surgical History:  Procedure Laterality Date  . athroscopy of left knee  2002  . CHOLECYSTECTOMY  2007  . COLONOSCOPY N/A 03/29/2016   Procedure: COLONOSCOPY;  Surgeon: Rogene Houston, MD;  [facility];  Service: Endoscopy;  Laterality: N/A;  1030  . removal of benign left breast lump  09/06/07    There were no vitals filed for this visit.      Subjective Assessment - 05/17/16 0956    Subjective Patient arrives today reporting that he is feeling better, his HEP feels like it is helping already, his pain is only 2/10 at worst right now.    Pertinent History HTN, history of L knee scope in 2002 (still having issues with this knee)   Patient Stated Goals reduce pain, be able to run if he needs to, be able to keep up with grandkids better    Currently in Pain? No/denies                         [facility] Adult PT Treatment/Exercise - 05/17/16 0001      Knee/Hip Exercises: Stretches   Hip Flexor Stretch Both;2 reps;30 seconds   Hip Flexor Stretch Limitations standing lunges      Knee/Hip Exercises: Standing   Forward Lunges Both;1 set;10 reps   Forward Lunges Limitations 4 inch box    Side Lunges Both;1 set;10 reps   Side Lunges Limitations 4 inch box    Other Standing Knee Exercises standing marches 1x10     Knee/Hip Exercises: Seated   Other Seated Knee/Hip Exercises opposite UE/LE marches on dynadisc/box; side crunches on dynadisc 1x10 each    Sit to Sand 2 sets;10 reps;without UE support  staggered stance      Knee/Hip Exercises: Supine   Bridges Both;1 set;10 reps   Bridges Limitations 2 sets, staggered    Other Supine Knee/Hip Exercises clams with red TB 5 second holds 1x15    Other Supine Knee/Hip Exercises core: toe touch crunches 1x10, reverse curls 1x10     Knee/Hip Exercises: Sidelying   Hip ABduction Both;15 reps     Knee/Hip Exercises: Prone   Hip Extension Both;1 set;15 reps  PT Education - 05/17/16 1030    Education provided Yes   Education Details reviewed initial eval/goals, HEP updates    Person(s) Educated Patient   Methods Explanation;Demonstration;Handout   Comprehension Verbalized understanding;Returned demonstration;Need further instruction          PT Short Term Goals - 05/10/16 0915      PT SHORT TERM GOAL #1   Title Patient to experience pain no more than 5/10 at worst in L hip flexor area in order to show improvement of condition and to improve QOL    Time 3   Period Weeks   Status New     PT SHORT TERM GOAL #2   Title Patient to demonstrate bilateral hip IR and ER each being as 45 degrees in order to improve mechanics and reduce stiffness possibly contributing to pain    Time 3   Period Weeks   Status New     PT SHORT TERM GOAL #3   Title Patient to demonstrate  correct self-manual technique to L hip flexor area in supine in order to improve self-efficacy in managing condition    Time 3   Period Weeks   Status New     PT SHORT TERM GOAL #4   Title Patient to be independent in correctly and consistently performing targeted HEP, to be updated as appropriate    Time 1   Period Weeks   Status New           PT Long Term Goals - 05/10/16 0917      PT LONG TERM GOAL #1   Title Patient to demonstrate functional strength 5/5 in all tested groups in order to reduce pain and improve gross mechanics    Time 6   Period Weeks   Status New     PT LONG TERM GOAL #2   Title Patient to experience pain no more than 2/10 at worst in L hip flexor area in order to improve activity performance and QOL    Time 6   Period Weeks   Status New     PT LONG TERM GOAL #3   Title Patient to be able to run 20-52ft at self-selected pace on level surfaces with no pain exacerbation in order to improve QOL and expand activity performance skills    Time 6   Period Weeks   Status New     PT LONG TERM GOAL #4   Title Patient to be participatory in appropriate regular exercise program, at least 20 minutes in duration and at least 4 days per week, in order to maintain functional gains and improve overall health status    Time 6   Period Weeks   Status New               Plan - 05/17/16 1031    Clinical Impression Statement Patient arrives today doing well, he reports compliance with HEP and states he is feeling much better already; introduced isolated proximal strengthening as well as core activities this session, also palpated involved hip flexor, noting moderate tightness/spasm compared to non-involved however patient reports that this has been improving greatly with exercises/stretches given for HEP. Added seated marches, hip hikes,  and tennis ball hip flexor massage to HEP as well.    Rehab Potential Excellent   Clinical Impairments Affecting Rehab Potential  (+) motivated to participate with skilled PT services, good overall health status with few co-morbidities; (-) chronic pain/injury   PT Frequency 2x / week   PT  Duration 6 weeks   PT Treatment/Interventions ADLs/Self Care Home Management;Biofeedback;Cryotherapy;Moist Heat;Gait training;Stair training;Functional mobility training;Therapeutic activities;Therapeutic exercise;Balance training;Neuromuscular re-education;Patient/family education;Manual techniques;Passive range of motion;Dry needling;Energy conservation;Taping   PT Next Visit Plan  manual to L hip flexor; proximal isolated muscle strengthening; core strengthening on dynadisc; hip flexor stretching/strengthening B   PT Home Exercise Plan Eval: thomas hip flexor stretch; bridges; sidelying hip ABD; 2/1 seated hip marches, hip hikes, hip flexor self massage    Consulted and Agree with Plan of Care Patient      Patient will benefit from skilled therapeutic intervention in order to improve the following deficits and impairments:  Abnormal gait, Improper body mechanics, Pain, Increased muscle spasms, Postural dysfunction, Decreased strength, Hypomobility, Difficulty walking, Impaired flexibility  Visit Diagnosis: Pain in left hip  Muscle weakness (generalized)  Other symptoms and signs involving the musculoskeletal system  Stiffness of left hip, not elsewhere classified  Stiffness of right hip, not elsewhere classified     Problem List Patient Active Problem List   Diagnosis Date Noted  . Diverticulosis of colon without hemorrhage 05/03/2016  . Welcome to Medicare preventive visit 02/05/2016  . Need for vaccination with 13-polyvalent pneumococcal conjugate vaccine 02/05/2016  . Dyslipidemia, goal LDL below 100 01/15/2014  . Carpal tunnel syndrome 02/14/2013  . Leukocytopenia 12/08/2007  . Allergic rhinitis 12/08/2007  . Essential hypertension 11/21/2007    Deniece Ree PT, DPT [facility] [address] [place], [place], [address] Phone: [phone]   Fax:  [phone]  Name: TRISDEN PIPPIN MRN: [id number] Date of Birth: December 29, 1950

## 2016-05-18 ENCOUNTER — Ambulatory Visit (INDEPENDENT_AMBULATORY_CARE_PROVIDER_SITE_OTHER): Payer: Medicare Other | Admitting: Orthopedic Surgery

## 2016-05-18 ENCOUNTER — Encounter: Payer: Self-pay | Admitting: Orthopedic Surgery

## 2016-05-18 ENCOUNTER — Ambulatory Visit (HOSPITAL_COMMUNITY): Payer: Medicare Other

## 2016-05-18 DIAGNOSIS — M4802 Spinal stenosis, cervical region: Secondary | ICD-10-CM | POA: Diagnosis not present

## 2016-05-18 DIAGNOSIS — M6281 Muscle weakness (generalized): Secondary | ICD-10-CM

## 2016-05-18 DIAGNOSIS — M25552 Pain in left hip: Secondary | ICD-10-CM

## 2016-05-18 DIAGNOSIS — M25652 Stiffness of left hip, not elsewhere classified: Secondary | ICD-10-CM

## 2016-05-18 DIAGNOSIS — S93529A Sprain of metatarsophalangeal joint of unspecified toe(s), initial encounter: Secondary | ICD-10-CM | POA: Diagnosis not present

## 2016-05-18 DIAGNOSIS — R29898 Other symptoms and signs involving the musculoskeletal system: Secondary | ICD-10-CM

## 2016-05-18 DIAGNOSIS — M25651 Stiffness of right hip, not elsewhere classified: Secondary | ICD-10-CM | POA: Diagnosis not present

## 2016-05-18 NOTE — Therapy (Signed)
[facility] [place], [place], [address] Phone: [phone]   Fax:  [phone]  Physical Therapy Treatment  Patient Details  Name: Mark Sandoval MRN: [id number] Date of Birth: Sep 13, 1950 Referring Provider: Arther Abbott   Encounter Date: 05/18/2016      PT End of Session - 05/18/16 0824    Visit Number 3   Number of Visits 13   Date for PT Re-Evaluation 05/31/16   Authorization Type Medicare/A[employer]na Senior Supplement   Authorization Time Period 05/10/16 to 06/21/16   Authorization - Visit Number 3   Authorization - Number of Visits 10   PT Start Time 0818   PT Stop Time 0901   PT Time Calculation (min) 43 min   Activity Tolerance Patient tolerated treatment well;No increased pain   Behavior During Therapy WFL for tasks assessed/performed      Past Medical History:  Diagnosis Date  . Arthritis of wrist    both   . Dyslipidemia   . GERD (gastroesophageal reflux disease)   . History of seasonal allergies   . Hypertension   . Leukopenia   . Lump or mass in breast    benign  . Thrombocytopenia ([place])     Past Surgical History:  Procedure Laterality Date  . athroscopy of left knee  2002  . CHOLECYSTECTOMY  2007  . COLONOSCOPY N/A 03/29/2016   Procedure: COLONOSCOPY;  Surgeon: Rogene Houston, MD;  [facility];  Service: Endoscopy;  Laterality: N/A;  1030  . removal of benign left breast lump  09/06/07    There were no vitals filed for this visit.      Subjective Assessment - 05/18/16 [id number]    Subjective Pt stated he strained his Rt great toe during a standing extension exercise, current pain scale 5-7/10 sharp pain with increased pain weight bearing   Pertinent History HTN, history of L knee scope in 2002 (still having issues with this knee)   Patient Stated Goals reduce pain, be able to run if he needs to, be able to keep up with grandkids better    Currently in Pain? Yes   Pain Score 7    Pain Location Toe (Comment which one)  Great toe   Pain Orientation Right   Pain Descriptors / Indicators Sharp   Pain Type Acute pain   Pain Frequency Constant   Aggravating Factors  weight bearing   Pain Relieving Factors none   Effect of Pain on Daily Activities Decreased ADLs                         [facility] Adult PT Treatment/Exercise - 05/18/16 0001      Knee/Hip Exercises: Stretches   Hip Flexor Stretch Both;2 reps;30 seconds   Hip Flexor Stretch Limitations standing 12in step      Knee/Hip Exercises: Standing   Forward Lunges Both;15 reps   Forward Lunges Limitations 6in box wihtout HHA for core activaiotn   Functional Squat 15 reps   Other Standing Knee Exercises standing marches 1x10     Knee/Hip Exercises: Seated   Other Seated Knee/Hip Exercises opposite UE/LE marches on dynadisc/box; side crunches on dynadisc 1x10 each      Knee/Hip Exercises: Supine   Bridges 15 reps   Straight Leg Raises 10 reps;Both   Other Supine Knee/Hip Exercises clams with red TB 5 second holds 1x15    Other Supine Knee/Hip Exercises core: toe touch crunches 1x10, reverse  curls 1x10; bicycle 10x     Knee/Hip Exercises: Sidelying   Hip ABduction Both;15 reps     Knee/Hip Exercises: Prone   Hip Extension Both;1 set;15 reps     Manual Therapy   Manual Therapy Soft tissue mobilization   Manual therapy comments Manual technqiues complete separate from rest of tx   Soft tissue mobilization Manual STM to left proximal quad and hip flexor musculature                  PT Short Term Goals - 05/10/16 0915      PT SHORT TERM GOAL #1   Title Patient to experience pain no more than 5/10 at worst in L hip flexor area in order to show improvement of condition and to improve QOL    Time 3   Period Weeks   Status New     PT SHORT TERM GOAL #2   Title Patient to demonstrate bilateral hip IR and ER each being as 45 degrees in order to improve mechanics and reduce stiffness  possibly contributing to pain    Time 3   Period Weeks   Status New     PT SHORT TERM GOAL #3   Title Patient to demonstrate correct self-manual technique to L hip flexor area in supine in order to improve self-efficacy in managing condition    Time 3   Period Weeks   Status New     PT SHORT TERM GOAL #4   Title Patient to be independent in correctly and consistently performing targeted HEP, to be updated as appropriate    Time 1   Period Weeks   Status New           PT Long Term Goals - 05/10/16 0917      PT LONG TERM GOAL #1   Title Patient to demonstrate functional strength 5/5 in all tested groups in order to reduce pain and improve gross mechanics    Time 6   Period Weeks   Status New     PT LONG TERM GOAL #2   Title Patient to experience pain no more than 2/10 at worst in L hip flexor area in order to improve activity performance and QOL    Time 6   Period Weeks   Status New     PT LONG TERM GOAL #3   Title Patient to be able to run 20-59ft at self-selected pace on level surfaces with no pain exacerbation in order to improve QOL and expand activity performance skills    Time 6   Period Weeks   Status New     PT LONG TERM GOAL #4   Title Patient to be participatory in appropriate regular exercise program, at least 20 minutes in duration and at least 4 days per week, in order to maintain functional gains and improve overall health status    Time 6   Period Weeks   Status New               Plan - 05/18/16 [id number]    Clinical Impression Statement Pt arrived with reports of strain Rt great toe during standing exercise last session, monitored form, technqiue and tolerance with all exercises this session.  Session focus on improving proximal and core strengthening and hip flexor stretches to improve gait mechanics with therapist facilitation for proper form and techqniues through session.  No reports of increased pain through session.  Pt with orthopedic MD apt  later today, plans on  having great toe looked at.   Rehab Potential Excellent   Clinical Impairments Affecting Rehab Potential (+) motivated to participate with skilled PT services, good overall health status with few co-morbidities; (-) chronic pain/injury   PT Frequency 2x / week   PT Duration 6 weeks   PT Treatment/Interventions ADLs/Self Care Home Management;Biofeedback;Cryotherapy;Moist Heat;Gait training;Stair training;Functional mobility training;Therapeutic activities;Therapeutic exercise;Balance training;Neuromuscular re-education;Patient/family education;Manual techniques;Passive range of motion;Dry needling;Energy conservation;Taping   PT Next Visit Plan  manual to L hip flexor; proximal isolated muscle strengthening; core strengthening on dynadisc; hip flexor stretching/strengthening B   PT Home Exercise Plan Eval: thomas hip flexor stretch; bridges; sidelying hip ABD; 2/1 seated hip marches, hip hikes, hip flexor self massage       Patient will benefit from skilled therapeutic intervention in order to improve the following deficits and impairments:  Abnormal gait, Improper body mechanics, Pain, Increased muscle spasms, Postural dysfunction, Decreased strength, Hypomobility, Difficulty walking, Impaired flexibility  Visit Diagnosis: Pain in left hip  Muscle weakness (generalized)  Other symptoms and signs involving the musculoskeletal system  Stiffness of left hip, not elsewhere classified  Stiffness of right hip, not elsewhere classified     Problem List Patient Active Problem List   Diagnosis Date Noted  . Diverticulosis of colon without hemorrhage 05/03/2016  . Welcome to Medicare preventive visit 02/05/2016  . Need for vaccination with 13-polyvalent pneumococcal conjugate vaccine 02/05/2016  . Dyslipidemia, goal LDL below 100 01/15/2014  . Carpal tunnel syndrome 02/14/2013  . Leukocytopenia 12/08/2007  . Allergic rhinitis 12/08/2007  . Essential hypertension  11/21/2007   Ihor Austin, [place]; [place]  Aldona Lento 05/18/2016, 7:08 PM  [place], [place], [address] Phone: [phone]   Fax:  [phone]  Name: Mark Sandoval MRN: [id number] Date of Birth: [birth year]

## 2016-05-18 NOTE — Progress Notes (Signed)
Follow-up visit  MRI results  MRI shows cervical spinal stenosis and spondylosis with anterolisthesis  Patient is still symptomatic it's positional  I did look at the MRI and the report and I interpret this as  cervical spondylosis and spinal stenosis between C3 and C7 with anterolisthesis  I think his symptoms are positional which suggests some laxity in the cervical spine  making referral for neurosurgery to evaluate   New problem chief complaint pain right great toe times one day  The patient was in physical therapy for his hip did some exercises felt a twinge of pain in the next day had severe pain and then when he went to therapy today to therapist noticed he was holding his foot in the wrong position  Has pain which is moderate to severe over the right great toe at the MTP joint is worse with range of motion worse with being barefoot and it is constant  Review of systemsshortness of breath chest pain numbness or weakness related to the toe  Past Medical History:  Diagnosis Date  . Arthritis of wrist    both   . Dyslipidemia   . GERD (gastroesophageal reflux disease)   . History of seasonal allergies   . Hypertension   . Leukopenia   . Lump or mass in breast    benign  . Thrombocytopenia ([place])    He is awake alert and oriented 3 mood and affect are normal appearance is normal his gait is altered by the great toe pain  Has tenderness and swelling of the great toe painful range of motion especially with flexion tenderness around the metatarsophalangeal joint. No instability. Extensor mechanism is intact skin is normal pulses are good sensation is intact as well and there is no lymphangitis or lymphadenopathy.   Turf toe  Recommend: hard sole shoe with soak Epson salt, NSAID  Encounter Diagnoses  Name Primary?  . Spinal stenosis in cervical region Yes  . Turf toe, initial encounter

## 2016-05-18 NOTE — Patient Instructions (Signed)
Turf toe treatment  Warm absent salt soaks 20 minutes twice a day  Aleve twice a day for 10 days  Hard sole shoe until comfortable  We will make the referral to [place] neurosurgery for cervical spinal stenosis

## 2016-05-22 ENCOUNTER — Encounter: Payer: Self-pay | Admitting: Orthopedic Surgery

## 2016-05-22 ENCOUNTER — Other Ambulatory Visit: Payer: Self-pay | Admitting: *Deleted

## 2016-05-22 DIAGNOSIS — M4802 Spinal stenosis, cervical region: Secondary | ICD-10-CM

## 2016-05-24 ENCOUNTER — Ambulatory Visit (HOSPITAL_COMMUNITY): Payer: Medicare Other | Admitting: Physical Therapy

## 2016-05-24 DIAGNOSIS — M25552 Pain in left hip: Secondary | ICD-10-CM | POA: Diagnosis not present

## 2016-05-24 DIAGNOSIS — M6281 Muscle weakness (generalized): Secondary | ICD-10-CM | POA: Diagnosis not present

## 2016-05-24 DIAGNOSIS — M25652 Stiffness of left hip, not elsewhere classified: Secondary | ICD-10-CM

## 2016-05-24 DIAGNOSIS — R29898 Other symptoms and signs involving the musculoskeletal system: Secondary | ICD-10-CM | POA: Diagnosis not present

## 2016-05-24 DIAGNOSIS — M25651 Stiffness of right hip, not elsewhere classified: Secondary | ICD-10-CM | POA: Diagnosis not present

## 2016-05-24 NOTE — Therapy (Signed)
[facility] [address] [place], [place], [address] Phone: [phone]   Fax:  [phone]  Physical Therapy Treatment  Patient Details  Name: Mark Sandoval MRN: [id number] Date of Birth: 1950/07/04 Referring Provider: Arther Abbott   Encounter Date: 05/24/2016      PT End of Session - 05/24/16 0905    Visit Number 4   Number of Visits 13   Date for PT Re-Evaluation 05/31/16   Authorization Type Medicare/Aetna Senior Supplement   Authorization Time Period 05/10/16 to 06/21/16   Authorization - Visit Number 4   Authorization - Number of Visits 10   PT Start Time 0815   PT Stop Time 0900   PT Time Calculation (min) 45 min   Activity Tolerance Patient tolerated treatment well;No increased pain   Behavior During Therapy WFL for tasks assessed/performed      Past Medical History:  Diagnosis Date  . Arthritis of wrist    both   . Dyslipidemia   . GERD (gastroesophageal reflux disease)   . History of seasonal allergies   . Hypertension   . Leukopenia   . Lump or mass in breast    benign  . Thrombocytopenia ([place])     Past Surgical History:  Procedure Laterality Date  . athroscopy of left knee  2002  . CHOLECYSTECTOMY  2007  . COLONOSCOPY N/A 03/29/2016   Procedure: COLONOSCOPY;  Surgeon: Rogene Houston, MD;  [facility];  Service: Endoscopy;  Laterality: N/A;  1030  . removal of benign left breast lump  09/06/07    There were no vitals filed for this visit.      Subjective Assessment - 05/24/16 0819    Subjective patient arrives stating that he hurt his great toe during a standing extension exercise; he has a boot that he wears through the day and he reports is helping. He has not tried running yet; he is supposed to be in the boot for 7-10 days along with anti-inflammatories.    Pertinent History HTN, history of L knee scope in 2002 (still having issues with this knee)   Patient Stated Goals reduce  pain, be able to run if he needs to, be able to keep up with grandkids better    Currently in Pain? No/denies                         [facility] Adult PT Treatment/Exercise - 05/24/16 0001      Knee/Hip Exercises: Stretches   Hip Flexor Stretch Both;2 reps;30 seconds   Hip Flexor Stretch Limitations standing 12in step      Knee/Hip Exercises: Standing   Forward Lunges Both;15 reps   Forward Lunges Limitations 4in box without HHA for core activation   Other Standing Knee Exercises standing hip flexion/extension simulating running form x10 BLE; bil side stepping with GTB 60ft x 3 laps   Other Standing Knee Exercises hip hikes 1x20 each; hip hikes with swing 1x15 each LE      Knee/Hip Exercises: Supine   Bridges Both;1 set;10 reps   Bridges Limitations single leg bridge, 10 each side    Straight Leg Raises Both;1 set;15 reps   Other Supine Knee/Hip Exercises hip circles for mobility 1x10 each    Other Supine Knee/Hip Exercises reverse progressed 1x10; hip hikes 1x20 simple form, 1x10 each LE with swing      Knee/Hip Exercises: Sidelying   Hip ABduction Both;15 reps   Hip  ABduction Limitations 2#    Other Sidelying Knee/Hip Exercises bil side planks x 10 each side     Knee/Hip Exercises: Prone   Hip Extension Both;1 set;15 reps   Hip Extension Limitations 2#                 PT Education - 05/24/16 0903    Education provided Yes   Education Details exercise technique; will slowly begin to integrate pt back into running program once his R great toe is feeling better but continue to rest for right now.   Person(s) Educated Patient   Methods Explanation;Demonstration   Comprehension Verbalized understanding;Returned demonstration          PT Short Term Goals - 05/10/16 0915      PT SHORT TERM GOAL #1   Title Patient to experience pain no more than 5/10 at worst in L hip flexor area in order to show improvement of condition and to improve QOL    Time 3    Period Weeks   Status New     PT SHORT TERM GOAL #2   Title Patient to demonstrate bilateral hip IR and ER each being as 45 degrees in order to improve mechanics and reduce stiffness possibly contributing to pain    Time 3   Period Weeks   Status New     PT SHORT TERM GOAL #3   Title Patient to demonstrate correct self-manual technique to L hip flexor area in supine in order to improve self-efficacy in managing condition    Time 3   Period Weeks   Status New     PT SHORT TERM GOAL #4   Title Patient to be independent in correctly and consistently performing targeted HEP, to be updated as appropriate    Time 1   Period Weeks   Status New           PT Long Term Goals - 05/10/16 0917      PT LONG TERM GOAL #1   Title Patient to demonstrate functional strength 5/5 in all tested groups in order to reduce pain and improve gross mechanics    Time 6   Period Weeks   Status New     PT LONG TERM GOAL #2   Title Patient to experience pain no more than 2/10 at worst in L hip flexor area in order to improve activity performance and QOL    Time 6   Period Weeks   Status New     PT LONG TERM GOAL #3   Title Patient to be able to run 20-11ft at self-selected pace on level surfaces with no pain exacerbation in order to improve QOL and expand activity performance skills    Time 6   Period Weeks   Status New     PT LONG TERM GOAL #4   Title Patient to be participatory in appropriate regular exercise program, at least 20 minutes in duration and at least 4 days per week, in order to maintain functional gains and improve overall health status    Time 6   Period Weeks   Status New               Plan - 05/24/16 0901    Clinical Impression Statement Patient arrives today stating his toe is feeling better; he has to be in the boot to protect it about 7-10 days however. Continued working on core and hip strength, progressing activities to assist in preparation for jogging/running-  HOWEVER,  do not recommend jogging/running trial until toe is consistently pain-free. Patient appears to be continuing to do fairly well overall and generally progressing as expected, however hip weakness and stiffness does appear to limit scope/form of some exercises today.    Rehab Potential Excellent   Clinical Impairments Affecting Rehab Potential (+) motivated to participate with skilled PT services, good overall health status with few co-morbidities; (-) chronic pain/injury   PT Frequency 2x / week   PT Duration 6 weeks   PT Treatment/Interventions ADLs/Self Care Home Management;Biofeedback;Cryotherapy;Moist Heat;Gait training;Stair training;Functional mobility training;Therapeutic activities;Therapeutic exercise;Balance training;Neuromuscular re-education;Patient/family education;Manual techniques;Passive range of motion;Dry needling;Energy conservation;Taping   PT Next Visit Plan  manual to L hip flexor; proximal isolated muscle strengthening; advance core and hip strengthening on dynadisc; hip flexor stretching/strengthening B. Exercises pre-cursing running. DO NOT RUN UNTIL TOE IS RESOLVED.    PT Home Exercise Plan Eval: thomas hip flexor stretch; bridges; sidelying hip ABD; 2/1 seated hip marches, hip hikes, hip flexor self massage    Consulted and Agree with Plan of Care Patient      Patient will benefit from skilled therapeutic intervention in order to improve the following deficits and impairments:  Abnormal gait, Improper body mechanics, Pain, Increased muscle spasms, Postural dysfunction, Decreased strength, Hypomobility, Difficulty walking, Impaired flexibility  Visit Diagnosis: Pain in left hip  Muscle weakness (generalized)  Other symptoms and signs involving the musculoskeletal system  Stiffness of left hip, not elsewhere classified  Stiffness of right hip, not elsewhere classified     Problem List Patient Active Problem List   Diagnosis Date Noted  . Diverticulosis  of colon without hemorrhage 05/03/2016  . Welcome to Medicare preventive visit 02/05/2016  . Need for vaccination with 13-polyvalent pneumococcal conjugate vaccine 02/05/2016  . Dyslipidemia, goal LDL below 100 01/15/2014  . Carpal tunnel syndrome 02/14/2013  . Leukocytopenia 12/08/2007  . Allergic rhinitis 12/08/2007  . Essential hypertension 11/21/2007   Geraldine Solar PT, DPT  Deniece Ree PT DPT   [facility] [address] [place], [place], [address] Phone: [phone]   Fax:  [phone]  Name: GUILHERME CHAPPEL MRN: [id number] Date of Birth: Sep 14, 1950

## 2016-05-25 ENCOUNTER — Ambulatory Visit (HOSPITAL_COMMUNITY): Payer: Medicare Other

## 2016-05-25 DIAGNOSIS — M6281 Muscle weakness (generalized): Secondary | ICD-10-CM | POA: Diagnosis not present

## 2016-05-25 DIAGNOSIS — M25651 Stiffness of right hip, not elsewhere classified: Secondary | ICD-10-CM

## 2016-05-25 DIAGNOSIS — M25652 Stiffness of left hip, not elsewhere classified: Secondary | ICD-10-CM

## 2016-05-25 DIAGNOSIS — M25552 Pain in left hip: Secondary | ICD-10-CM | POA: Diagnosis not present

## 2016-05-25 DIAGNOSIS — R29898 Other symptoms and signs involving the musculoskeletal system: Secondary | ICD-10-CM

## 2016-05-25 NOTE — Therapy (Signed)
[place], [place], [address] Phone: [phone]   Fax:  [phone]  Physical Therapy Treatment  Patient Details  Name: Mark Sandoval MRN: [id number] Date of Birth: 1950/05/12 Referring Provider: Arther Abbott   Encounter Date: 05/25/2016      PT End of Session - 05/25/16 0824    Visit Number 5   Number of Visits 13   Date for PT Re-Evaluation 05/31/16   Authorization Type Medicare/Aetna Senior Supplement   Authorization Time Period 05/10/16 to 06/21/16   Authorization - Visit Number 5   Authorization - Number of Visits 10   PT Start Time 0815   PT Stop Time [id number]   PT Time Calculation (min) 43 min      Past Medical History:  Diagnosis Date  . Arthritis of wrist    both   . Dyslipidemia   . GERD (gastroesophageal reflux disease)   . History of seasonal allergies   . Hypertension   . Leukopenia   . Lump or mass in breast    benign  . Thrombocytopenia ([place])     Past Surgical History:  Procedure Laterality Date  . athroscopy of left knee  2002  . CHOLECYSTECTOMY  2007  . COLONOSCOPY N/A 03/29/2016   Procedure: COLONOSCOPY;  Surgeon: Rogene Houston, MD;  [facility];  Service: Endoscopy;  Laterality: N/A;  1030  . removal of benign left breast lump  09/06/07    There were no vitals filed for this visit.      Subjective Assessment - 05/25/16 0813    Subjective Pt stated he is feeling good today, no reports of pain and great toe feeling better.  Continues to wear boot at home, stopped anti-inflammatories medication.  Reports reduced frequency with HEP since injuring toe.     Pertinent History HTN, history of L knee scope in 2002 (still having issues with this knee)   Patient Stated Goals reduce pain, be able to run if he needs to, be able to keep up with grandkids better    Currently in Pain? No/denies                         [facility] Adult PT Treatment/Exercise -  05/25/16 0001      Knee/Hip Exercises: Stretches   Hip Flexor Stretch Both;2 reps;30 seconds   Hip Flexor Stretch Limitations standing 12in step      Knee/Hip Exercises: Standing   Forward Lunges Both;15 reps   Forward Lunges Limitations 4in box without HHA for core activation   Functional Squat 15 reps   Other Standing Knee Exercises standing hip flexion/extension simulating running form x10 BLE; opposite UE/LE 10x; bil side stepping with GTB 11ft x 3 laps   Other Standing Knee Exercises hip hikes 1x20 each; hip hikes with swing 1x15 each LE      Knee/Hip Exercises: Supine   Single Leg Bridge 10 reps  single leg bridge   Other Supine Knee/Hip Exercises bicycle (cueing for form/technique); dead bug 20x; reverse curl 10x     Knee/Hip Exercises: Sidelying   Hip ABduction Both;15 reps   Hip ABduction Limitations 2#      Knee/Hip Exercises: Prone   Hip Extension Both;1 set;15 reps   Hip Extension Limitations 2#                 PT Education - 05/24/16 0903    Education provided Yes   Education  Details exercise technique; will slowly begin to integrate pt back into running program once his R great toe is feeling better but continue to rest for right now.   Person(s) Educated Patient   Methods Explanation;Demonstration   Comprehension Verbalized understanding;Returned demonstration          PT Short Term Goals - 05/10/16 0915      PT SHORT TERM GOAL #1   Title Patient to experience pain no more than 5/10 at worst in L hip flexor area in order to show improvement of condition and to improve QOL    Time 3   Period Weeks   Status New     PT SHORT TERM GOAL #2   Title Patient to demonstrate bilateral hip IR and ER each being as 45 degrees in order to improve mechanics and reduce stiffness possibly contributing to pain    Time 3   Period Weeks   Status New     PT SHORT TERM GOAL #3   Title Patient to demonstrate correct self-manual technique to L hip flexor area in  supine in order to improve self-efficacy in managing condition    Time 3   Period Weeks   Status New     PT SHORT TERM GOAL #4   Title Patient to be independent in correctly and consistently performing targeted HEP, to be updated as appropriate    Time 1   Period Weeks   Status New           PT Long Term Goals - 05/10/16 0917      PT LONG TERM GOAL #1   Title Patient to demonstrate functional strength 5/5 in all tested groups in order to reduce pain and improve gross mechanics    Time 6   Period Weeks   Status New     PT LONG TERM GOAL #2   Title Patient to experience pain no more than 2/10 at worst in L hip flexor area in order to improve activity performance and QOL    Time 6   Period Weeks   Status New     PT LONG TERM GOAL #3   Title Patient to be able to run 20-52ft at self-selected pace on level surfaces with no pain exacerbation in order to improve QOL and expand activity performance skills    Time 6   Period Weeks   Status New     PT LONG TERM GOAL #4   Title Patient to be participatory in appropriate regular exercise program, at least 20 minutes in duration and at least 4 days per week, in order to maintain functional gains and improve overall health status    Time 6   Period Weeks   Status New               Plan - 05/25/16 [id number]    Clinical Impression Statement Pt stated toe pain resolved.  Session focus on improving core and proximal strengthening with increased difficutly with supine core based activities.  Therapist facilitation for proper form and technique with therex.  No reports of pain through session.   Rehab Potential Excellent   Clinical Impairments Affecting Rehab Potential (+) motivated to participate with skilled PT services, good overall health status with few co-morbidities; (-) chronic pain/injury   PT Frequency 2x / week   PT Duration 6 weeks   PT Treatment/Interventions ADLs/Self Care Home Management;Biofeedback;Cryotherapy;Moist  Heat;Gait training;Stair training;Functional mobility training;Therapeutic activities;Therapeutic exercise;Balance training;Neuromuscular re-education;Patient/family education;Manual techniques;Passive range of motion;Dry needling;Energy conservation;Taping  PT Next Visit Plan  manual to L hip flexor; proximal isolated muscle strengthening; advance core and hip strengthening on dynadisc; hip flexor stretching/strengthening B. Exercises pre-cursing running. DO NOT RUN UNTIL TOE IS RESOLVED.       Patient will benefit from skilled therapeutic intervention in order to improve the following deficits and impairments:  Abnormal gait, Improper body mechanics, Pain, Increased muscle spasms, Postural dysfunction, Decreased strength, Hypomobility, Difficulty walking, Impaired flexibility  Visit Diagnosis: Pain in left hip  Muscle weakness (generalized)  Other symptoms and signs involving the musculoskeletal system  Stiffness of left hip, not elsewhere classified  Stiffness of right hip, not elsewhere classified     Problem List Patient Active Problem List   Diagnosis Date Noted  . Diverticulosis of colon without hemorrhage 05/03/2016  . Welcome to Medicare preventive visit 02/05/2016  . Need for vaccination with 13-polyvalent pneumococcal conjugate vaccine 02/05/2016  . Dyslipidemia, goal LDL below 100 01/15/2014  . Carpal tunnel syndrome 02/14/2013  . Leukocytopenia 12/08/2007  . Allergic rhinitis 12/08/2007  . Essential hypertension 11/21/2007   Ihor Austin, [place]; [place]  Aldona Lento 05/25/2016, 9:02 AM  [place], [place], [address] Phone: [phone]   Fax:  [phone]  Name: Mark Sandoval MRN: [id number] Date of Birth: 1950-10-09

## 2016-05-31 ENCOUNTER — Ambulatory Visit (HOSPITAL_COMMUNITY): Payer: Medicare Other

## 2016-05-31 ENCOUNTER — Telehealth (HOSPITAL_COMMUNITY): Payer: Self-pay | Admitting: Physical Therapy

## 2016-05-31 DIAGNOSIS — M25552 Pain in left hip: Secondary | ICD-10-CM

## 2016-05-31 DIAGNOSIS — M25651 Stiffness of right hip, not elsewhere classified: Secondary | ICD-10-CM

## 2016-05-31 DIAGNOSIS — M6281 Muscle weakness (generalized): Secondary | ICD-10-CM | POA: Diagnosis not present

## 2016-05-31 DIAGNOSIS — M25652 Stiffness of left hip, not elsewhere classified: Secondary | ICD-10-CM | POA: Diagnosis not present

## 2016-05-31 DIAGNOSIS — M4722 Other spondylosis with radiculopathy, cervical region: Secondary | ICD-10-CM | POA: Diagnosis not present

## 2016-05-31 DIAGNOSIS — R29898 Other symptoms and signs involving the musculoskeletal system: Secondary | ICD-10-CM | POA: Diagnosis not present

## 2016-05-31 NOTE — Therapy (Addendum)
PHYSICAL THERAPY DISCHARGE SUMMARY  Visits from Start of Care: 6  Current functional level related to goals / functional outcomes: *See below    Remaining deficits: *See below    Education / Equipment: *See below  Plan: Patient agrees to discharge.  Patient goals were met. Patient is being discharged due to meeting the stated rehab goals.  ?????            12:36 PM, 05/31/16 Etta Grandchild, PT, DPT Physical Therapist at [place] [phone] (office)             [place], [place], [address] Phone: [phone]   Fax:  [phone]  Physical Therapy Treatment  Patient Details  Name: RAFORD BRISSETT MRN: [id number] Date of Birth: [birth year] Referring Provider: Arther Abbott   Encounter Date: 05/31/2016      PT End of Session - 05/31/16 1036    Visit Number 6   Number of Visits 13   Authorization Type Medicare/Aetna Senior Supplement   Authorization Time Period 05/10/16 to 06/21/16   Authorization - Visit Number 6   Authorization - Number of Visits 10   PT Start Time 0951   PT Stop Time 6144   PT Time Calculation (min) 43 min   Activity Tolerance Patient tolerated treatment well;No increased pain   Behavior During Therapy WFL for tasks assessed/performed      Past Medical History:  Diagnosis Date  . Arthritis of wrist    both   . Dyslipidemia   . GERD (gastroesophageal reflux disease)   . History of seasonal allergies   . Hypertension   . Leukopenia   . Lump or mass in breast    benign  . Thrombocytopenia ([place])     Past Surgical History:  Procedure Laterality Date  . athroscopy of left knee  2002  . CHOLECYSTECTOMY  2007  . COLONOSCOPY N/A 03/29/2016   Procedure: COLONOSCOPY;  Surgeon: Rogene Houston, MD;  [facility];  Service: Endoscopy;  Laterality: N/A;  1030  . removal of benign left breast lump  09/06/07    There  were no vitals filed for this visit.      Subjective Assessment - 05/31/16 1004    Subjective Pt report she has been very unhappy with his plan of care for a number of reasons. He has been unsatisfied with inconsistent scheduling with different staff. Subsequent visits have aggravated Rt sided sciatica issues, Rt sided sciatica pain, Rt sided turf toe etcetera. The patient reports resolution of pain of his CC has resolved rather quickly since beginning therapy. Pt reports he is seeing neurospine later today for the cervical spine issue.    Pertinent History HTN, history of L knee scope in 2002 (still having issues with this knee)   How long can you sit comfortably? unlimited   How long can you stand comfortably? unlimited   Patient Stated Goals reduce pain, be able to run if he needs to, be able to keep up with grandkids better    Currently in Pain? Yes   Pain Location --  R posterior hip with associated hallux-tingling; constant 5/10; Rt shoulder pain 3/10.              [facility] PT Assessment - 05/31/16 0001      Assessment   Medical Diagnosis L hip flexor strain    Referring Provider Arther Abbott    Onset Date/Surgical Date --  3 YA    Next MD Visit None pertaining to the hip    Prior Therapy none     Balance Screen   Has the patient fallen in the past 6 months --  no fall since starting therapy     Prior Function   Level of Independence Independent;Independent with basic ADLs;Independent with gait;Independent with transfers   Vocation Retired     ROM / Strength   AROM / PROM / Strength PROM     PROM   PROM Assessment Site Hip   Right/Left Hip Right;Left   Right Hip External Rotation  40   Right Hip Internal Rotation  45   Left Hip External Rotation  30   Left Hip Internal Rotation  40     Strength   Right Hip Flexion 5/5   Right Hip Extension --  Pt will not allow testing, fearful of aggravation   Right Hip External Rotation  5/5   Right Hip Internal Rotation  5/5   Right Hip ABduction 4/5  horizontal abduciton: 5/5   Right Hip ADduction 5/5   Left Hip Flexion 5/5   Left Hip Extension 5/5   Left Hip External Rotation 5/5   Left Hip Internal Rotation 5/5   Left Hip ABduction 4/5  horizontal abduciton: 5/5   Left Hip ADduction 5/5   Right Knee Flexion 5/5   Right Knee Extension 5/5   Left Knee Flexion 5/5   Left Knee Extension 5/5   Right Ankle Dorsiflexion --   Left Ankle Dorsiflexion --     Right Hip   Right Hip Extension 20     Flexibility   Hamstrings 148* Lt; 128* Rt.                                PT Short Term Goals - 05/31/16 1022      PT SHORT TERM GOAL #1   Title Patient to experience pain no more than 5/10 at worst in L hip flexor area in order to show improvement of condition and to improve QOL    Time 3   Period Weeks   Status Achieved     PT SHORT TERM GOAL #2   Title Patient to demonstrate bilateral hip IR and ER each being as 45 degrees in order to improve mechanics and reduce stiffness possibly contributing to pain    Baseline 45/45 on Rt: 30* IR on left appears to be more end range joint restriction, rather than soft tissue restriction.    Time 3   Period Weeks   Status Achieved     PT SHORT TERM GOAL #3   Title Patient to demonstrate correct self-manual technique to L hip flexor area in supine in order to improve self-efficacy in managing condition    Baseline Performed with a ball.    Time 3   Period Weeks   Status Achieved     PT SHORT TERM GOAL #4   Title Patient to be independent in correctly and consistently performing targeted HEP, to be updated as appropriate    Baseline Pt was ble to perform early on with accuracy.    Time 1   Period Weeks   Status Achieved           PT Long Term Goals - 05/31/16 1026      PT LONG TERM GOAL #1   Title Patient to demonstrate functional strength 5/5 in all tested  groups in order to reduce pain and improve gross mechanics    Time 6    Period Weeks   Status New     PT LONG TERM GOAL #2   Title Patient to experience pain no more than 2/10 at worst in L hip flexor area in order to improve activity performance and QOL    Time 6   Period Weeks   Status Achieved     PT LONG TERM GOAL #3   Title Patient to be able to run 20-10f at self-selected pace on level surfaces with no pain exacerbation in order to improve QOL and expand activity performance skills    Baseline unable due to neck/Rt side pain   Time 6   Period Weeks   Status Deferred               Plan - [id number]    Clinical Impression Statement Pt has met all short term goals, and all of appropriate long term goals. His CC is resolved in Left hip pain. He now has pain in his Right lower back and right cervical spine and would like to DC at this time to pursue treatment options for those.     Rehab Potential Excellent   Clinical Impairments Affecting Rehab Potential (+) motivated to participate with skilled PT services, good overall health status with few co-morbidities; (-) chronic pain/injury   PT Frequency 2x / week   PT Duration 6 weeks   PT Treatment/Interventions ADLs/Self Care Home Management;Biofeedback;Cryotherapy;Moist Heat;Gait training;Stair training;Functional mobility training;Therapeutic activities;Therapeutic exercise;Balance training;Neuromuscular re-education;Patient/family education;Manual techniques;Passive range of motion;Dry needling;Energy conservation;Taping   PT Next Visit Plan  manual to L hip flexor; proximal isolated muscle strengthening; advance core and hip strengthening on dynadisc; hip flexor stretching/strengthening B. Exercises pre-cursing running. DO NOT RUN UNTIL TOE IS RESOLVED.    PT Home Exercise Plan Eval: thomas hip flexor stretch; bridges; sidelying hip ABD; 2/1 seated hip marches, hip hikes, hip flexor self massage    Consulted and Agree with Plan of Care Patient      Patient will benefit from skilled  therapeutic intervention in order to improve the following deficits and impairments:  Abnormal gait, Improper body mechanics, Pain, Increased muscle spasms, Postural dysfunction, Decreased strength, Hypomobility, Difficulty walking, Impaired flexibility  Visit Diagnosis: Pain in left hip  Muscle weakness (generalized)  Other symptoms and signs involving the musculoskeletal system  Stiffness of left hip, not elsewhere classified  Stiffness of right hip, not elsewhere classified       G-Codes - 0Mar 05, [id number]    Functional Assessment Tool Used Clinical Judgment    Functional Limitation Mobility: Walking and moving around   Mobility: Walking and Moving Around Goal Status [phone] At least 1 percent but less than 20 percent impaired, limited or restricted   Mobility: Walking and Moving Around Discharge Status [phone] At least 1 percent but less than 20 percent impaired, limited or restricted      Problem List Patient Active Problem List   Diagnosis Date Noted  . Diverticulosis of colon without hemorrhage 05/03/2016  . Welcome to Medicare preventive visit 02/05/2016  . Need for vaccination with 13-polyvalent pneumococcal conjugate vaccine 02/05/2016  . Dyslipidemia, goal LDL below 100 01/15/2014  . Carpal tunnel syndrome 02/14/2013  . Leukocytopenia 12/08/2007  . Allergic rhinitis 12/08/2007  . Essential hypertension 11/21/2007    10:47 AM, [id number] Grandchild PT, DPT Physical Therapist at [phone](office)     [facility] [place], [place], [address] Phone: [phone]   Fax:  [phone]  Name: LORIE MELICHAR MRN: [id number] Date of Birth: Dec 04, 1950

## 2016-05-31 NOTE — Telephone Encounter (Signed)
Patient calls today stating he is wondering if he should continue with PT as everything is making him hurt; he states that he needs the rest of the week to recover. He states the basic exercises were helping but the progressed ones are just too much.   DPT was able to convince patient to come in for assessment of current exercises and revamp of POC; patient agreeable to this.   Deniece Ree PT, DPT [phone]

## 2016-06-01 ENCOUNTER — Ambulatory Visit (HOSPITAL_COMMUNITY): Payer: Medicare Other

## 2016-06-07 ENCOUNTER — Ambulatory Visit (HOSPITAL_COMMUNITY): Payer: Medicare Other | Admitting: Physical Therapy

## 2016-06-07 DIAGNOSIS — E785 Hyperlipidemia, unspecified: Secondary | ICD-10-CM | POA: Diagnosis not present

## 2016-06-07 DIAGNOSIS — I10 Essential (primary) hypertension: Secondary | ICD-10-CM | POA: Diagnosis not present

## 2016-06-07 DIAGNOSIS — E559 Vitamin D deficiency, unspecified: Secondary | ICD-10-CM | POA: Diagnosis not present

## 2016-06-07 LAB — BASIC METABOLIC PANEL
BUN: 14 mg/dL (ref 7–25)
CHLORIDE: 104 mmol/L (ref 98–110)
CO2: 29 mmol/L (ref 20–31)
CREATININE: 1.19 mg/dL (ref 0.70–1.25)
Calcium: 9 mg/dL (ref 8.6–10.3)
Glucose, Bld: 86 mg/dL (ref 65–99)
Potassium: 4.7 mmol/L (ref 3.5–5.3)
Sodium: 141 mmol/L (ref 135–146)

## 2016-06-07 LAB — CBC
HCT: 40.8 % (ref 38.5–50.0)
Hemoglobin: 13.4 g/dL (ref 13.2–17.1)
MCH: 22.8 pg — AB (ref 27.0–33.0)
MCHC: 32.8 g/dL (ref 32.0–36.0)
MCV: 69.5 fL — AB (ref 80.0–100.0)
PLATELETS: 135 10*3/uL — AB (ref 140–400)
RBC: 5.87 MIL/uL — ABNORMAL HIGH (ref 4.20–5.80)
RDW: 17.4 % — AB (ref 11.0–15.0)
WBC: 3.2 10*3/uL — AB (ref 3.8–10.8)

## 2016-06-07 LAB — VITAMIN D 25 HYDROXY (VIT D DEFICIENCY, FRACTURES): Vit D, 25-Hydroxy: 39 ng/mL (ref 30–100)

## 2016-06-08 ENCOUNTER — Ambulatory Visit (HOSPITAL_COMMUNITY): Payer: Medicare Other

## 2016-06-10 ENCOUNTER — Encounter: Payer: Self-pay | Admitting: Family Medicine

## 2016-06-12 ENCOUNTER — Other Ambulatory Visit: Payer: Self-pay

## 2016-06-14 ENCOUNTER — Encounter: Payer: Self-pay | Admitting: Family Medicine

## 2016-06-14 ENCOUNTER — Ambulatory Visit (INDEPENDENT_AMBULATORY_CARE_PROVIDER_SITE_OTHER): Payer: Medicare Other | Admitting: Family Medicine

## 2016-06-14 VITALS — BP 120/76 | HR 84 | Resp 15 | Ht 73.0 in | Wt 185.4 lb

## 2016-06-14 DIAGNOSIS — I10 Essential (primary) hypertension: Secondary | ICD-10-CM | POA: Diagnosis not present

## 2016-06-14 DIAGNOSIS — M542 Cervicalgia: Secondary | ICD-10-CM | POA: Diagnosis not present

## 2016-06-14 DIAGNOSIS — D72819 Decreased white blood cell count, unspecified: Secondary | ICD-10-CM | POA: Diagnosis not present

## 2016-06-14 NOTE — Patient Instructions (Addendum)
Wellness visit Oct 13 or after, call if you need me before  Call for TdaP if you ger cut  Excellent blood pressure  No med changes  You are being referred for 2nd neurosurgy  Opinion at [facility] per your request, I do not see any reason for neck surgery   Do not commit to daily diclofenac use only if needed  It is important that you exercise regularly at least 30 minutes 5 times a week. If you develop chest pain, have severe difficulty breathing, or feel very tired, stop exercising immediately and seek medical attention   Please work on good  health habits so that your health will improve. 1. Commitment to daily physical activity for 30 to 60  minutes, if you are able to do this.  2. Commitment to wise food choices. Aim for half of your  food intake to be vegetable and fruit, one quarter starchy foods, and one quarter protein. Try to eat on a regular schedule  3 meals per day, snacking between meals should be limited to vegetables or fruits or small portions of nuts. 64 ounces of water per day is generally recommended, unless you have specific health conditions, like heart failure or kidney failure where you will need to limit fluid intake.  3. Commitment to sufficient and a  good quality of physical and mental rest daily, generally between 6 to 8 hours per day.  WITH PERSISTANCE AND PERSEVERANCE, THE IMPOSSIBLE , BECOMES THE NORM!

## 2016-06-14 NOTE — Progress Notes (Signed)
   Mark Sandoval     MRN: [id number]      DOB: [birth year]   HPI Mark Sandoval is here for follow up and re-evaluation of chronic medical conditions, medication management and review of any available recent lab and radiology data.  Preventive health is updated, specifically  Cancer screening and Immunization.   Questions or concerns regarding consultations or procedures which the PT has had in the interim are  Addressed.Discussed and reviewed the neck and shoulder symptoms and MRI imaging report. Wants 2nd neurosurgical opinion as he was told by ortho that the problem in his neck is "serious" Denies weakness or numbness in extremity and pain is very sporadic with specific aggravating factors The PT denies any adverse reactions to current medications since the last visit.   ROS See HPI  Denies recent fever or chills. Denies sinus pressure, nasal congestion, ear pain or sore throat. Denies chest congestion, productive cough or wheezing. Denies chest pains, palpitations and leg swelling Denies abdominal pain, nausea, vomiting,diarrhea or constipation.   Denies dysuria, frequency, hesitancy or incontinence. . Denies headaches, seizures, numbness, or tingling. Denies depression, anxiety or insomnia. Denies skin break down or rash.   PE  BP 120/76   Pulse 84   Resp 15   Ht 6\' 1"  (1.854 m)   Wt 185 lb 6.4 oz (84.1 kg)   SpO2 100%   BMI 24.46 kg/m   Patient alert and oriented and in no cardiopulmonary distress.  HEENT: No facial asymmetry, EOMI,   oropharynx  moist.  Neck supple no JVD, no mass.  Chest: Clear to auscultation bilaterally.  CVS: S1, S2 no murmurs, no S3.Regular rate.  ABD: Soft non tender.   Ext: No edema  MS: Adequate ROM spine, shoulders, hips and knees.  Skin: Intact, no ulcerations or rash noted.  Psych: Good eye contact, normal affect. Memory intact not anxious or depressed appearing.  CNS: CN 2-12 intact, power,  normal throughout.no focal  deficits noted.   Assessment & Plan  Essential hypertension Controlled, no change in medication DASH diet and commitment to daily physical activity for a minimum of 30 minutes discussed and encouraged, as a part of hypertension management. The importance of attaining a healthy weight is also discussed.  BP/Weight 06/14/2016 03/29/2016 02/10/2016 01/26/2016 08/25/2015 07/22/2015 [id number]  Systolic BP 123456 XX123456 [id number] Q000111Q A999333 [id number] [id number]  Diastolic BP 76 70 81 82 84 73 88  Wt. (Lbs) 185.4 171 178 178 195 192 189  BMI 24.46 22.56 23.48 23.48 25.73 25.34 24.94       Leukocytopenia Stable  Neck pain on right side Intermittent, with specific aggravating factors, no neurologic signs or symptoms in upper extremity, however mildly abnormal MRI report. Pt has been told by one neurosurgeon that surgery is not indicated, is prescribed NSAID for twice daily use I recommended discontinuing regular use as asymptomatic. Requests 2nd neurosurgical opinion at [facility], will refer per his request. I reviewed reports and symptoms and exam findings with pt and stated that I agreed with current neurosurgeon's recommendation

## 2016-06-15 NOTE — Assessment & Plan Note (Signed)
Stable

## 2016-06-15 NOTE — Assessment & Plan Note (Signed)
Intermittent, with specific aggravating factors, no neurologic signs or symptoms in upper extremity, however mildly abnormal MRI report. Pt has been told by one neurosurgeon that surgery is not indicated, is prescribed NSAID for twice daily use I recommended discontinuing regular use as asymptomatic. Requests 2nd neurosurgical opinion at [facility], will refer per his request. I reviewed reports and symptoms and exam findings with pt and stated that I agreed with current neurosurgeon's recommendation

## 2016-06-15 NOTE — Assessment & Plan Note (Signed)
Controlled, no change in medication DASH diet and commitment to daily physical activity for a minimum of 30 minutes discussed and encouraged, as a part of hypertension management. The importance of attaining a healthy weight is also discussed.  BP/Weight 06/14/2016 03/29/2016 02/10/2016 01/26/2016 08/25/2015 07/22/2015 [id number]  Systolic BP 123456 XX123456 [id number] Q000111Q A999333 [id number] [id number]  Diastolic BP 76 70 81 82 84 73 88  Wt. (Lbs) 185.4 171 178 178 195 192 189  BMI 24.46 22.56 23.48 23.48 25.73 25.34 24.94

## 2016-06-28 DIAGNOSIS — I10 Essential (primary) hypertension: Secondary | ICD-10-CM | POA: Diagnosis not present

## 2016-06-28 DIAGNOSIS — M4722 Other spondylosis with radiculopathy, cervical region: Secondary | ICD-10-CM | POA: Diagnosis not present

## 2016-07-23 DIAGNOSIS — M9903 Segmental and somatic dysfunction of lumbar region: Secondary | ICD-10-CM | POA: Diagnosis not present

## 2016-07-23 DIAGNOSIS — M545 Low back pain: Secondary | ICD-10-CM | POA: Diagnosis not present

## 2016-08-02 ENCOUNTER — Telehealth: Payer: Self-pay | Admitting: Family Medicine

## 2016-08-02 ENCOUNTER — Other Ambulatory Visit: Payer: Self-pay

## 2016-08-02 MED ORDER — AMLODIPINE BESYLATE 5 MG PO TABS: ORAL_TABLET | ORAL | 1 refills | Status: DC

## 2016-08-02 NOTE — Telephone Encounter (Signed)
cb# [phone]  Requesting refill for:   amLODipine (NORVASC) 5 MG tablet   Patient will run out in 5 days, he is going out of town tomorrow. Pharmacy: [facility]

## 2016-08-02 NOTE — Telephone Encounter (Signed)
Done

## 2016-08-17 DIAGNOSIS — D582 Other hemoglobinopathies: Secondary | ICD-10-CM | POA: Insufficient documentation

## 2016-08-17 DIAGNOSIS — R718 Other abnormality of red blood cells: Secondary | ICD-10-CM | POA: Insufficient documentation

## 2016-08-17 DIAGNOSIS — D709 Neutropenia, unspecified: Secondary | ICD-10-CM | POA: Insufficient documentation

## 2016-08-20 DIAGNOSIS — D696 Thrombocytopenia, unspecified: Secondary | ICD-10-CM | POA: Diagnosis not present

## 2016-08-20 DIAGNOSIS — R718 Other abnormality of red blood cells: Secondary | ICD-10-CM | POA: Diagnosis not present

## 2016-08-20 DIAGNOSIS — I10 Essential (primary) hypertension: Secondary | ICD-10-CM | POA: Diagnosis not present

## 2016-08-20 DIAGNOSIS — D582 Other hemoglobinopathies: Secondary | ICD-10-CM | POA: Diagnosis not present

## 2016-08-20 DIAGNOSIS — D709 Neutropenia, unspecified: Secondary | ICD-10-CM | POA: Diagnosis not present

## 2016-08-21 ENCOUNTER — Telehealth: Payer: Self-pay

## 2016-08-21 ENCOUNTER — Encounter: Payer: Self-pay | Admitting: Family Medicine

## 2016-08-21 DIAGNOSIS — M4722 Other spondylosis with radiculopathy, cervical region: Secondary | ICD-10-CM | POA: Insufficient documentation

## 2016-08-21 DIAGNOSIS — N281 Cyst of kidney, acquired: Secondary | ICD-10-CM | POA: Diagnosis not present

## 2016-08-21 DIAGNOSIS — M47892 Other spondylosis, cervical region: Secondary | ICD-10-CM | POA: Diagnosis not present

## 2016-08-21 DIAGNOSIS — M542 Cervicalgia: Secondary | ICD-10-CM | POA: Diagnosis not present

## 2016-08-21 MED ORDER — AZELASTINE HCL 0.1 % NA SOLN: 2.0000 | Freq: Two times a day (BID) | NASAL | 5 refills | Status: DC

## 2016-08-21 NOTE — Telephone Encounter (Signed)
rx sent to pharmacy

## 2016-08-21 NOTE — Telephone Encounter (Signed)
done

## 2016-09-06 DIAGNOSIS — N281 Cyst of kidney, acquired: Secondary | ICD-10-CM | POA: Diagnosis not present

## 2016-09-07 DIAGNOSIS — M542 Cervicalgia: Secondary | ICD-10-CM | POA: Diagnosis not present

## 2016-09-07 DIAGNOSIS — M4722 Other spondylosis with radiculopathy, cervical region: Secondary | ICD-10-CM | POA: Diagnosis not present

## 2016-09-07 DIAGNOSIS — M50123 Cervical disc disorder at C6-C7 level with radiculopathy: Secondary | ICD-10-CM | POA: Diagnosis not present

## 2016-09-13 DIAGNOSIS — N281 Cyst of kidney, acquired: Secondary | ICD-10-CM | POA: Diagnosis not present

## 2016-09-13 DIAGNOSIS — Z125 Encounter for screening for malignant neoplasm of prostate: Secondary | ICD-10-CM | POA: Diagnosis not present

## 2016-09-19 DIAGNOSIS — M50123 Cervical disc disorder at C6-C7 level with radiculopathy: Secondary | ICD-10-CM | POA: Diagnosis not present

## 2016-09-19 DIAGNOSIS — M5013 Cervical disc disorder with radiculopathy, cervicothoracic region: Secondary | ICD-10-CM | POA: Diagnosis not present

## 2016-09-24 DIAGNOSIS — M542 Cervicalgia: Secondary | ICD-10-CM | POA: Diagnosis not present

## 2016-09-24 DIAGNOSIS — M25511 Pain in right shoulder: Secondary | ICD-10-CM | POA: Diagnosis not present

## 2016-09-24 DIAGNOSIS — M25411 Effusion, right shoulder: Secondary | ICD-10-CM | POA: Diagnosis not present

## 2016-09-24 DIAGNOSIS — M25611 Stiffness of right shoulder, not elsewhere classified: Secondary | ICD-10-CM | POA: Diagnosis not present

## 2016-09-26 DIAGNOSIS — M25511 Pain in right shoulder: Secondary | ICD-10-CM | POA: Diagnosis not present

## 2016-09-26 DIAGNOSIS — M542 Cervicalgia: Secondary | ICD-10-CM | POA: Diagnosis not present

## 2016-09-26 DIAGNOSIS — M25411 Effusion, right shoulder: Secondary | ICD-10-CM | POA: Diagnosis not present

## 2016-09-26 DIAGNOSIS — M25611 Stiffness of right shoulder, not elsewhere classified: Secondary | ICD-10-CM | POA: Diagnosis not present

## 2016-10-01 DIAGNOSIS — M542 Cervicalgia: Secondary | ICD-10-CM | POA: Diagnosis not present

## 2016-10-01 DIAGNOSIS — M25611 Stiffness of right shoulder, not elsewhere classified: Secondary | ICD-10-CM | POA: Diagnosis not present

## 2016-10-01 DIAGNOSIS — M25411 Effusion, right shoulder: Secondary | ICD-10-CM | POA: Diagnosis not present

## 2016-10-01 DIAGNOSIS — M25511 Pain in right shoulder: Secondary | ICD-10-CM | POA: Diagnosis not present

## 2016-10-03 DIAGNOSIS — M542 Cervicalgia: Secondary | ICD-10-CM | POA: Diagnosis not present

## 2016-10-03 DIAGNOSIS — M25511 Pain in right shoulder: Secondary | ICD-10-CM | POA: Diagnosis not present

## 2016-10-03 DIAGNOSIS — M25611 Stiffness of right shoulder, not elsewhere classified: Secondary | ICD-10-CM | POA: Diagnosis not present

## 2016-10-03 DIAGNOSIS — M25411 Effusion, right shoulder: Secondary | ICD-10-CM | POA: Diagnosis not present

## 2016-10-08 DIAGNOSIS — M4722 Other spondylosis with radiculopathy, cervical region: Secondary | ICD-10-CM | POA: Diagnosis not present

## 2016-10-08 DIAGNOSIS — M50123 Cervical disc disorder at C6-C7 level with radiculopathy: Secondary | ICD-10-CM | POA: Diagnosis not present

## 2016-10-08 DIAGNOSIS — M25511 Pain in right shoulder: Secondary | ICD-10-CM | POA: Diagnosis not present

## 2016-10-08 DIAGNOSIS — M25411 Effusion, right shoulder: Secondary | ICD-10-CM | POA: Diagnosis not present

## 2016-10-08 DIAGNOSIS — M25611 Stiffness of right shoulder, not elsewhere classified: Secondary | ICD-10-CM | POA: Diagnosis not present

## 2016-10-08 DIAGNOSIS — M542 Cervicalgia: Secondary | ICD-10-CM | POA: Diagnosis not present

## 2016-10-10 DIAGNOSIS — M542 Cervicalgia: Secondary | ICD-10-CM | POA: Diagnosis not present

## 2016-10-10 DIAGNOSIS — M25611 Stiffness of right shoulder, not elsewhere classified: Secondary | ICD-10-CM | POA: Diagnosis not present

## 2016-10-10 DIAGNOSIS — M25511 Pain in right shoulder: Secondary | ICD-10-CM | POA: Diagnosis not present

## 2016-10-10 DIAGNOSIS — M25411 Effusion, right shoulder: Secondary | ICD-10-CM | POA: Diagnosis not present

## 2016-11-07 DIAGNOSIS — M9903 Segmental and somatic dysfunction of lumbar region: Secondary | ICD-10-CM | POA: Diagnosis not present

## 2016-11-07 DIAGNOSIS — M545 Low back pain: Secondary | ICD-10-CM | POA: Diagnosis not present

## 2016-11-08 DIAGNOSIS — M9903 Segmental and somatic dysfunction of lumbar region: Secondary | ICD-10-CM | POA: Diagnosis not present

## 2016-11-08 DIAGNOSIS — M545 Low back pain: Secondary | ICD-10-CM | POA: Diagnosis not present

## 2016-11-30 ENCOUNTER — Telehealth: Payer: Self-pay | Admitting: Family Medicine

## 2016-11-30 ENCOUNTER — Encounter: Payer: Self-pay | Admitting: Family Medicine

## 2016-11-30 DIAGNOSIS — I10 Essential (primary) hypertension: Secondary | ICD-10-CM

## 2016-11-30 DIAGNOSIS — D72819 Decreased white blood cell count, unspecified: Secondary | ICD-10-CM | POA: Diagnosis not present

## 2016-11-30 DIAGNOSIS — Z130 Encounter for screening for diseases of the blood and blood-forming organs and certain disorders involving the immune mechanism: Secondary | ICD-10-CM

## 2016-11-30 DIAGNOSIS — E785 Hyperlipidemia, unspecified: Secondary | ICD-10-CM

## 2016-11-30 LAB — CBC
HEMATOCRIT: 39.8 % (ref 38.5–50.0)
HEMOGLOBIN: 12.9 g/dL — AB (ref 13.2–17.1)
MCH: 22.9 pg — AB (ref 27.0–33.0)
MCHC: 32.4 g/dL (ref 32.0–36.0)
MCV: 70.6 fL — ABNORMAL LOW (ref 80.0–100.0)
Platelets: 143 10*3/uL (ref 140–400)
RBC: 5.64 MIL/uL (ref 4.20–5.80)
RDW: 16.6 % — ABNORMAL HIGH (ref 11.0–15.0)
WBC: 3.5 10*3/uL — ABNORMAL LOW (ref 3.8–10.8)

## 2016-11-30 NOTE — Telephone Encounter (Signed)
Just order CBC only, and do keep CBC as a a stat order, thanks!

## 2016-11-30 NOTE — Telephone Encounter (Signed)
-----  Message from Fayrene Helper, MD sent at 11/30/2016 11:35 AM EDT ----- Regarding: labs needed For next visit Fasting lipid,cmp and EGFR, tSH, CBC , iron and ferritin  He needs to have his [facility] ordered as a stat lab  Order the others should NOT  be ordered sta, he can have blood drawn at the lab, he does not need to go to the [facility]

## 2016-11-30 NOTE — Telephone Encounter (Signed)
STAT lab called to office at 5:51 pm.  I reviewed the CBC, not appreciably changed from prior.  Will leave for Dr Moshe Cipro to address on Monday.  YSN

## 2016-11-30 NOTE — Telephone Encounter (Signed)
Labs will not associated with disorders available. Ordered CBC separate. Iron $17, Ferritin $35, continue with order?

## 2016-12-10 ENCOUNTER — Telehealth: Payer: Self-pay | Admitting: Family Medicine

## 2016-12-10 DIAGNOSIS — E785 Hyperlipidemia, unspecified: Secondary | ICD-10-CM

## 2016-12-10 DIAGNOSIS — I10 Essential (primary) hypertension: Secondary | ICD-10-CM

## 2016-12-10 NOTE — Telephone Encounter (Signed)
Labs ordered as directed.

## 2016-12-25 DIAGNOSIS — I10 Essential (primary) hypertension: Secondary | ICD-10-CM | POA: Diagnosis not present

## 2016-12-25 DIAGNOSIS — E785 Hyperlipidemia, unspecified: Secondary | ICD-10-CM | POA: Diagnosis not present

## 2016-12-25 LAB — COMPLETE METABOLIC PANEL WITH GFR
AG RATIO: 1.6 (calc) (ref 1.0–2.5)
ALT: 18 U/L (ref 9–46)
AST: 19 U/L (ref 10–35)
Albumin: 3.9 g/dL (ref 3.6–5.1)
Alkaline phosphatase (APISO): 90 U/L (ref 40–115)
BUN: 16 mg/dL (ref 7–25)
CALCIUM: 8.8 mg/dL (ref 8.6–10.3)
CO2: 28 mmol/L (ref 20–32)
CREATININE: 1.15 mg/dL (ref 0.70–1.25)
Chloride: 104 mmol/L (ref 98–110)
GFR, EST NON AFRICAN AMERICAN: 66 mL/min/{1.73_m2} (ref >=60)
GFR, Est African American: 77 mL/min/{1.73_m2} (ref >=60)
GLOBULIN: 2.4 g/dL (ref 1.9–3.7)
Glucose, Bld: 89 mg/dL (ref 65–99)
POTASSIUM: 4.3 mmol/L (ref 3.5–5.3)
SODIUM: 137 mmol/L (ref 135–146)
Total Bilirubin: 0.7 mg/dL (ref 0.2–1.2)
Total Protein: 6.3 g/dL (ref 6.1–8.1)

## 2016-12-25 LAB — LIPID PANEL
CHOLESTEROL: 169 mg/dL (ref <200)
HDL: 61 mg/dL (ref >40)
LDL Cholesterol (Calc): 91 mg/dL (calc)
NON-HDL CHOLESTEROL (CALC): 108 mg/dL (ref <130)
Total CHOL/HDL Ratio: 2.8 (calc) (ref <5.0)
Triglycerides: 78 mg/dL (ref <150)

## 2016-12-25 LAB — TSH: TSH: 1.85 m[IU]/L (ref 0.40–4.50)

## 2016-12-27 DIAGNOSIS — D225 Melanocytic nevi of trunk: Secondary | ICD-10-CM | POA: Diagnosis not present

## 2016-12-27 DIAGNOSIS — L308 Other specified dermatitis: Secondary | ICD-10-CM | POA: Diagnosis not present

## 2016-12-27 DIAGNOSIS — L0202 Furuncle of face: Secondary | ICD-10-CM | POA: Diagnosis not present

## 2016-12-27 DIAGNOSIS — L918 Other hypertrophic disorders of the skin: Secondary | ICD-10-CM | POA: Diagnosis not present

## 2017-01-01 ENCOUNTER — Ambulatory Visit (INDEPENDENT_AMBULATORY_CARE_PROVIDER_SITE_OTHER): Payer: Medicare Other | Admitting: Family Medicine

## 2017-01-01 ENCOUNTER — Other Ambulatory Visit: Payer: Self-pay | Admitting: Family Medicine

## 2017-01-01 ENCOUNTER — Encounter: Payer: Self-pay | Admitting: Family Medicine

## 2017-01-01 VITALS — BP 110/76 | HR 78 | Temp 98.0°F | Resp 16 | Ht 73.0 in | Wt 185.8 lb

## 2017-01-01 DIAGNOSIS — Z23 Encounter for immunization: Secondary | ICD-10-CM | POA: Diagnosis not present

## 2017-01-01 DIAGNOSIS — M79641 Pain in right hand: Secondary | ICD-10-CM | POA: Insufficient documentation

## 2017-01-01 DIAGNOSIS — J309 Allergic rhinitis, unspecified: Secondary | ICD-10-CM | POA: Diagnosis not present

## 2017-01-01 DIAGNOSIS — I10 Essential (primary) hypertension: Secondary | ICD-10-CM

## 2017-01-01 DIAGNOSIS — M79642 Pain in left hand: Secondary | ICD-10-CM

## 2017-01-01 MED ORDER — AZELASTINE HCL 0.1 % NA SOLN: 2.0000 | Freq: Two times a day (BID) | NASAL | 12 refills | Status: DC

## 2017-01-01 NOTE — Patient Instructions (Signed)
F/u in 6 month, call if you need me soner   No med changwes  Congrats on excellent labs    You are being referred to Dr Amedeo Plenty for evaluation of bilateral hand pain in Novemebr   TdAp and Flu vaccines today

## 2017-01-01 NOTE — Progress Notes (Signed)
   Mark Sandoval     MRN: [id number]      DOB: 19-Mar-1951   HPI Mark Sandoval is here for follow up and re-evaluation of chronic medical conditions, medication management and review of any available recent lab and radiology data.  Preventive health is updated, specifically  Cancer screening and Immunization.   Questions or concerns regarding consultations or procedures which the PT has had in the interim are  Addressed.Benefited from PT for neck pain, also had epidural in C spine.  The PT denies any adverse reactions to current medications since the last visit.  Left wrist pain and right index knuckle pain increased wants re evaluation by ortho Needs TdAP as visiting newborn grandson in next several weeks   ROS Denies recent fever or chills. Denies sinus pressure, nasal congestion, ear pain or sore throat. Denies chest congestion, productive cough or wheezing. Denies chest pains, palpitations and leg swelling Denies abdominal pain, nausea, vomiting,diarrhea or constipation.   Denies dysuria, frequency, hesitancy or incontinence.  Denies headaches, seizures, numbness, or tingling. Denies depression, anxiety or insomnia. Denies skin break down or rash.   PE  BP 110/76 (BP Location: Left Arm, Patient Position: Sitting, Cuff Size: Normal)   Pulse 78   Temp 98 F (36.7 C) (Other (Comment))   Resp 16   Ht 6\' 1"  (1.854 m)   Wt 185 lb 12 oz (84.3 kg)   SpO2 98%   BMI 24.51 kg/m   Patient alert and oriented and in no cardiopulmonary distress.  HEENT: No facial asymmetry, EOMI,   oropharynx pink and moist.  Neck supple no JVD, no mass.  Chest: Clear to auscultation bilaterally.  CVS: S1, S2 no murmurs, no S3.Regular rate.  ABD: Soft non tender.   Ext: No edema  MS: Adequate ROM spine, shoulders, hips and knees.  Skin: Intact, no ulcerations or rash noted.  Psych: Good eye contact, normal affect. Memory intact not anxious or depressed appearing.  CNS: CN 2-12 intact,  power,  normal throughout.no focal deficits noted.   Assessment & Plan  Essential hypertension Controlled, no change in medication DASH diet and commitment to daily physical activity for a minimum of 30 minutes discussed and encouraged, as a part of hypertension management. The importance of attaining a healthy weight is also discussed.  BP/Weight 01/01/2017 06/14/2016 03/29/2016 02/10/2016 01/26/2016 5/59/7416 06/22/4534  Systolic BP 468 032 122 482 500 370 488  Diastolic BP 76 76 70 81 82 84 73  Wt. (Lbs) 185.75 185.4 171 178 178 195 192  BMI 24.51 24.46 22.56 23.48 23.48 25.73 25.34       Allergic rhinitis Controlled, no change in medication Refills sent  Need for Tdap vaccination Will be present for bitrth of 5th grandchild in next several weeks,needs  pertussis protection, TDaP administered after informed consent by nursing staff  Bilateral hand pain Increased left wrist pain and right index pain for past several weeks, refer to ortho for re eval in [employer]

## 2017-01-02 DIAGNOSIS — Z23 Encounter for immunization: Secondary | ICD-10-CM | POA: Insufficient documentation

## 2017-01-02 NOTE — Assessment & Plan Note (Signed)
Controlled, no change in medication DASH diet and commitment to daily physical activity for a minimum of 30 minutes discussed and encouraged, as a part of hypertension management. The importance of attaining a healthy weight is also discussed.  BP/Weight 01/01/2017 06/14/2016 03/29/2016 02/10/2016 01/26/2016 8/85/0277 07/15/2876  Systolic BP 676 720 947 096 283 662 947  Diastolic BP 76 76 70 81 82 84 73  Wt. (Lbs) 185.75 185.4 171 178 178 195 192  BMI 24.51 24.46 22.56 23.48 23.48 25.73 25.34

## 2017-01-02 NOTE — Assessment & Plan Note (Signed)
Increased left wrist pain and right index pain for past several weeks, refer to ortho for re eval in [employer]

## 2017-01-02 NOTE — Assessment & Plan Note (Signed)
Controlled, no change in medication Refills sent

## 2017-01-02 NOTE — Assessment & Plan Note (Signed)
Will be present for bitrth of 5th grandchild in next several weeks,needs  pertussis protection, TDaP administered after informed consent by nursing staff

## 2017-01-10 ENCOUNTER — Other Ambulatory Visit: Payer: Self-pay | Admitting: Family Medicine

## 2017-01-14 DIAGNOSIS — M545 Low back pain: Secondary | ICD-10-CM | POA: Diagnosis not present

## 2017-01-14 DIAGNOSIS — M9905 Segmental and somatic dysfunction of pelvic region: Secondary | ICD-10-CM | POA: Diagnosis not present

## 2017-01-31 ENCOUNTER — Ambulatory Visit: Payer: Medicare Other

## 2017-03-04 DIAGNOSIS — M25522 Pain in left elbow: Secondary | ICD-10-CM | POA: Diagnosis not present

## 2017-03-04 DIAGNOSIS — M25521 Pain in right elbow: Secondary | ICD-10-CM | POA: Diagnosis not present

## 2017-03-04 DIAGNOSIS — M25531 Pain in right wrist: Secondary | ICD-10-CM | POA: Diagnosis not present

## 2017-03-04 DIAGNOSIS — M25532 Pain in left wrist: Secondary | ICD-10-CM | POA: Diagnosis not present

## 2017-03-19 ENCOUNTER — Encounter: Payer: Self-pay | Admitting: Family Medicine

## 2017-03-20 ENCOUNTER — Encounter: Payer: Self-pay | Admitting: Family Medicine

## 2017-03-20 DIAGNOSIS — M9905 Segmental and somatic dysfunction of pelvic region: Secondary | ICD-10-CM | POA: Diagnosis not present

## 2017-03-20 DIAGNOSIS — M545 Low back pain: Secondary | ICD-10-CM | POA: Diagnosis not present

## 2017-03-29 DIAGNOSIS — M545 Low back pain: Secondary | ICD-10-CM | POA: Diagnosis not present

## 2017-03-29 DIAGNOSIS — M9905 Segmental and somatic dysfunction of pelvic region: Secondary | ICD-10-CM | POA: Diagnosis not present

## 2017-04-05 ENCOUNTER — Other Ambulatory Visit: Payer: Self-pay

## 2017-04-05 MED ORDER — AMLODIPINE BESYLATE 5 MG PO TABS: 5.0000 mg | ORAL_TABLET | Freq: Every day | ORAL | 1 refills | Status: DC

## 2017-04-17 ENCOUNTER — Encounter: Payer: Self-pay | Admitting: Family Medicine

## 2017-04-17 ENCOUNTER — Other Ambulatory Visit: Payer: Self-pay

## 2017-04-17 MED ORDER — AMLODIPINE BESYLATE 5 MG PO TABS: 5.0000 mg | ORAL_TABLET | Freq: Every day | ORAL | 1 refills | Status: DC

## 2017-04-22 DIAGNOSIS — M549 Dorsalgia, unspecified: Secondary | ICD-10-CM | POA: Diagnosis not present

## 2017-04-22 DIAGNOSIS — M1288 Other specific arthropathies, not elsewhere classified, other specified site: Secondary | ICD-10-CM | POA: Diagnosis not present

## 2017-04-22 DIAGNOSIS — M25511 Pain in right shoulder: Secondary | ICD-10-CM | POA: Diagnosis not present

## 2017-04-22 DIAGNOSIS — M9941 Connective tissue stenosis of neural canal of cervical region: Secondary | ICD-10-CM | POA: Diagnosis not present

## 2017-04-22 DIAGNOSIS — M4802 Spinal stenosis, cervical region: Secondary | ICD-10-CM | POA: Diagnosis not present

## 2017-05-13 DIAGNOSIS — N400 Enlarged prostate without lower urinary tract symptoms: Secondary | ICD-10-CM | POA: Diagnosis not present

## 2017-05-28 DIAGNOSIS — M9941 Connective tissue stenosis of neural canal of cervical region: Secondary | ICD-10-CM | POA: Diagnosis not present

## 2017-05-28 DIAGNOSIS — M4802 Spinal stenosis, cervical region: Secondary | ICD-10-CM | POA: Diagnosis not present

## 2017-05-28 DIAGNOSIS — M50223 Other cervical disc displacement at C6-C7 level: Secondary | ICD-10-CM | POA: Diagnosis not present

## 2017-06-10 DIAGNOSIS — N400 Enlarged prostate without lower urinary tract symptoms: Secondary | ICD-10-CM | POA: Diagnosis not present

## 2017-06-17 DIAGNOSIS — M542 Cervicalgia: Secondary | ICD-10-CM | POA: Diagnosis not present

## 2017-06-24 ENCOUNTER — Encounter: Payer: Self-pay | Admitting: Family Medicine

## 2017-06-25 ENCOUNTER — Encounter: Payer: Self-pay | Admitting: Family Medicine

## 2017-06-27 ENCOUNTER — Encounter: Payer: Self-pay | Admitting: Family Medicine

## 2017-06-27 ENCOUNTER — Ambulatory Visit (INDEPENDENT_AMBULATORY_CARE_PROVIDER_SITE_OTHER): Payer: Medicare Other | Admitting: Family Medicine

## 2017-06-27 ENCOUNTER — Other Ambulatory Visit (HOSPITAL_COMMUNITY)
Admission: RE | Admit: 2017-06-27 | Discharge: 2017-06-27 | Disposition: A | Discharge status: Home / Self Care | Payer: Medicare Other | Source: Ambulatory Visit | Attending: Family Medicine | Admitting: Family Medicine

## 2017-06-27 ENCOUNTER — Ambulatory Visit (HOSPITAL_COMMUNITY)
Admission: RE | Admit: 2017-06-27 | Discharge: 2017-06-27 | Disposition: A | Discharge status: Home / Self Care | Payer: Medicare Other | Source: Ambulatory Visit | Attending: Family Medicine | Admitting: Family Medicine

## 2017-06-27 VITALS — BP 120/78 | HR 81 | Temp 99.1°F | Resp 16 | Ht 73.0 in | Wt 186.0 lb

## 2017-06-27 DIAGNOSIS — R509 Fever, unspecified: Secondary | ICD-10-CM | POA: Insufficient documentation

## 2017-06-27 DIAGNOSIS — R05 Cough: Secondary | ICD-10-CM | POA: Insufficient documentation

## 2017-06-27 DIAGNOSIS — I10 Essential (primary) hypertension: Secondary | ICD-10-CM

## 2017-06-27 DIAGNOSIS — N179 Acute kidney failure, unspecified: Secondary | ICD-10-CM | POA: Diagnosis not present

## 2017-06-27 LAB — BASIC METABOLIC PANEL
Anion gap: 10 (ref 5–15)
BUN: 20 mg/dL (ref 6–20)
CHLORIDE: 100 mmol/L — AB (ref 101–111)
CO2: 29 mmol/L (ref 22–32)
Calcium: 9.4 mg/dL (ref 8.9–10.3)
Creatinine, Ser: 1.59 mg/dL — ABNORMAL HIGH (ref 0.61–1.24)
GFR calc Af Amer: 51 mL/min — ABNORMAL LOW (ref >60)
GFR calc non Af Amer: 44 mL/min — ABNORMAL LOW (ref >60)
GLUCOSE: 104 mg/dL — AB (ref 65–99)
POTASSIUM: 4.8 mmol/L (ref 3.5–5.1)
Sodium: 139 mmol/L (ref 135–145)

## 2017-06-27 LAB — HEPATIC FUNCTION PANEL
ALT: 25 U/L (ref 17–63)
AST: 30 U/L (ref 15–41)
Albumin: 4 g/dL (ref 3.5–5.0)
Alkaline Phosphatase: 98 U/L (ref 38–126)
BILIRUBIN DIRECT: 0.1 mg/dL (ref 0.1–0.5)
BILIRUBIN TOTAL: 0.7 mg/dL (ref 0.3–1.2)
Indirect Bilirubin: 0.6 mg/dL (ref 0.3–0.9)
Total Protein: 6.9 g/dL (ref 6.5–8.1)

## 2017-06-27 LAB — POCT INFLUENZA A/B
INFLUENZA A, POC: NEGATIVE
Influenza B, POC: NEGATIVE

## 2017-06-27 LAB — CBC WITH DIFFERENTIAL/PLATELET
Basophils Absolute: 0 10*3/uL (ref 0.0–0.1)
Basophils Relative: 0 %
Eosinophils Absolute: 0 10*3/uL (ref 0.0–0.7)
Eosinophils Relative: 0 %
HEMATOCRIT: 41.1 % (ref 39.0–52.0)
Hemoglobin: 13.8 g/dL (ref 13.0–17.0)
LYMPHS ABS: 0.9 10*3/uL (ref 0.7–4.0)
LYMPHS PCT: 35 %
MCH: 22.8 pg — AB (ref 26.0–34.0)
MCHC: 33.6 g/dL (ref 30.0–36.0)
MCV: 68 fL — AB (ref 78.0–100.0)
MONO ABS: 0.5 10*3/uL (ref 0.1–1.0)
MONOS PCT: 19 %
Neutro Abs: 1.2 10*3/uL — ABNORMAL LOW (ref 1.7–7.7)
Neutrophils Relative %: 46 %
Platelets: 120 10*3/uL — ABNORMAL LOW (ref 150–400)
RBC: 6.04 MIL/uL — ABNORMAL HIGH (ref 4.22–5.81)
RDW: 15.5 % (ref 11.5–15.5)
WBC: 2.6 10*3/uL — ABNORMAL LOW (ref 4.0–10.5)

## 2017-06-27 NOTE — Patient Instructions (Addendum)
F/u  As before, , call if you need me sooner  Please get CXR and CBC and difff , cmp and EGFR  Hold blood pressure medication until early afternoon  Push fluids, tylenol for pain and fever over 99.9  I am treating you for acute viral illness  Flu test is negative

## 2017-06-28 ENCOUNTER — Telehealth: Payer: Self-pay

## 2017-06-28 DIAGNOSIS — I10 Essential (primary) hypertension: Secondary | ICD-10-CM

## 2017-06-28 NOTE — Telephone Encounter (Signed)
-----  Message from Fayrene Helper, MD sent at 06/27/2017  1:07 PM EDT ----- pls order chem 7 and eGFR to be dione non fasting next Wednesday, he is to collect from the office

## 2017-06-29 DIAGNOSIS — N179 Acute kidney failure, unspecified: Secondary | ICD-10-CM | POA: Insufficient documentation

## 2017-06-29 NOTE — Assessment & Plan Note (Signed)
Pt to increase fluids , currently dehydrated due to acute illness. Rept  Non fasting lab in 1 week, he is aware

## 2017-06-29 NOTE — Assessment & Plan Note (Signed)
Pt to take antihypertensive medication early afternoon, he held it today as he has essentially been NPO for past 2 days Increased fluid intake advised, zofran sent if needed

## 2017-06-29 NOTE — Progress Notes (Signed)
   Mark Sandoval     MRN: [id number]      DOB: 11/06/1950   HPI [name] is here with a 2 day h/o acute fever , chills , nausea and poor appetite accompanied  By light headedness He denies loose stool, has little stool, no other family member ll. No known sick contact Epigastric [p pain when he tries to cough and feels as though he has sputum to come up but nothing is coming up. Denies sore throat or ear pain C/o body aches and chills ROS  Denies chest pains, palpitations and leg swelling Denies abdominal pain, nausea, vomiting,diarrhea or constipation.   Denies dysuria, frequency, hesitancy or incontinence. C/o disabling upper back pain limits ability to stand or walk for prolonged periods, less than 10 minutes, no surgical option Denies headaches, seizures, numbness, or tingling. Denies depression, anxiety or insomnia. Denies skin break down or rash.   PE  BP 120/78   Pulse 81   Temp 99.1 F (37.3 C) (Oral)   Resp 16   Ht 6\' 1"  (1.854 m)   Wt 186 lb (84.4 kg)   SpO2 99%   BMI 24.54 kg/m   Patient alert and oriented and in no cardiopulmonary distress.  HEENT: No facial asymmetry, EOMI,   oropharynx pink and moist.  Neck supple no JVD, no mass.no sinus tenderness Mild erythema of oropharynx , no exudate  Chest: Clear to auscultation bilaterally.  CVS: S1, S2 no murmurs, no S3.Regular rate.  ABD: Soft non tender.   Ext: No edema  MS: decreased ROM spine, adequate inshoulders, hips and knees.  Skin: Intact, no ulcerations or rash noted.  Psych: Good eye contact, normal affect.  not anxious or depressed appearing.  CNS: CN 2-12 intact, .no focal deficits noted.   Assessment & Plan  Fever Acute febrile illness , exam negative for localized infection CXR and baseline lab today Symptomatic treatment Influenza test negative Pt to call if deteriorates Encouraged to push fluids  Essential hypertension Pt to take antihypertensive medication early  afternoon, he held it today as he has essentially been NPO for past 2 days Increased fluid intake advised, zofran sent if needed  AKI (acute kidney injury) ([place]) Pt to increase fluids , currently dehydrated due to acute illness. Rept  Non fasting lab in 1 week, he is aware

## 2017-06-29 NOTE — Assessment & Plan Note (Signed)
Acute febrile illness , exam negative for localized infection CXR and baseline lab today Symptomatic treatment Influenza test negative Pt to call if deteriorates Encouraged to push fluids

## 2017-07-02 ENCOUNTER — Telehealth: Payer: Self-pay

## 2017-07-02 DIAGNOSIS — I10 Essential (primary) hypertension: Secondary | ICD-10-CM

## 2017-07-02 NOTE — Telephone Encounter (Signed)
Labs ordered.

## 2017-07-03 ENCOUNTER — Other Ambulatory Visit (HOSPITAL_COMMUNITY)
Admission: RE | Admit: 2017-07-03 | Discharge: 2017-07-03 | Disposition: A | Discharge status: Home / Self Care | Payer: Medicare Other | Source: Ambulatory Visit | Attending: Family Medicine | Admitting: Family Medicine

## 2017-07-03 ENCOUNTER — Encounter: Payer: Self-pay | Admitting: Family Medicine

## 2017-07-03 DIAGNOSIS — I10 Essential (primary) hypertension: Secondary | ICD-10-CM | POA: Insufficient documentation

## 2017-07-03 LAB — BASIC METABOLIC PANEL
ANION GAP: 10 (ref 5–15)
BUN: 16 mg/dL (ref 6–20)
CO2: 27 mmol/L (ref 22–32)
Calcium: 8.7 mg/dL — ABNORMAL LOW (ref 8.9–10.3)
Chloride: 101 mmol/L (ref 101–111)
Creatinine, Ser: 1.25 mg/dL — ABNORMAL HIGH (ref 0.61–1.24)
GFR calc Af Amer: >60 mL/min (ref >60)
GFR, EST NON AFRICAN AMERICAN: 58 mL/min — AB (ref >60)
GLUCOSE: 95 mg/dL (ref 65–99)
POTASSIUM: 4.2 mmol/L (ref 3.5–5.1)
Sodium: 138 mmol/L (ref 135–145)

## 2017-07-04 ENCOUNTER — Ambulatory Visit: Payer: Medicare Other | Admitting: Family Medicine

## 2017-07-10 ENCOUNTER — Ambulatory Visit: Payer: Medicare Other | Admitting: Family Medicine

## 2017-07-26 DIAGNOSIS — R509 Fever, unspecified: Secondary | ICD-10-CM | POA: Diagnosis not present

## 2017-07-26 LAB — CBC WITH DIFFERENTIAL/PLATELET
Basophils Absolute: 29 cells/uL (ref 0–200)
Basophils Relative: 0.9 %
EOS ABS: 141 {cells}/uL (ref 15–500)
Eosinophils Relative: 4.4 %
HCT: 41.1 % (ref 38.5–50.0)
Hemoglobin: 13 g/dL — ABNORMAL LOW (ref 13.2–17.1)
Lymphs Abs: 1229 cells/uL (ref 850–3900)
MCH: 22.4 pg — AB (ref 27.0–33.0)
MCHC: 31.6 g/dL — AB (ref 32.0–36.0)
MCV: 70.9 fL — ABNORMAL LOW (ref 80.0–100.0)
MPV: 11.2 fL (ref 7.5–12.5)
Monocytes Relative: 9.4 %
NEUTROS PCT: 46.9 %
Neutro Abs: 1501 cells/uL (ref 1500–7800)
PLATELETS: 146 10*3/uL (ref 140–400)
RBC: 5.8 10*6/uL (ref 4.20–5.80)
RDW: 17 % — AB (ref 11.0–15.0)
TOTAL LYMPHOCYTE: 38.4 %
WBC: 3.2 10*3/uL — AB (ref 3.8–10.8)
WBCMIX: 301 {cells}/uL (ref 200–950)

## 2017-07-26 LAB — BASIC METABOLIC PANEL WITH GFR
BUN / CREAT RATIO: 11 (calc) (ref 6–22)
BUN: 15 mg/dL (ref 7–25)
CO2: 28 mmol/L (ref 20–32)
Calcium: 8.8 mg/dL (ref 8.6–10.3)
Chloride: 102 mmol/L (ref 98–110)
Creat: 1.38 mg/dL — ABNORMAL HIGH (ref 0.70–1.25)
GFR, Est African American: 61 mL/min/{1.73_m2} (ref >=60)
GFR, Est Non African American: 53 mL/min/{1.73_m2} — ABNORMAL LOW (ref >=60)
GLUCOSE: 87 mg/dL (ref 65–99)
Potassium: 4.2 mmol/L (ref 3.5–5.3)
SODIUM: 136 mmol/L (ref 135–146)

## 2017-07-26 LAB — HEPATIC FUNCTION PANEL
AG Ratio: 1.7 (calc) (ref 1.0–2.5)
ALT: 15 U/L (ref 9–46)
AST: 21 U/L (ref 10–35)
Albumin: 4.1 g/dL (ref 3.6–5.1)
Alkaline phosphatase (APISO): 95 U/L (ref 40–115)
BILIRUBIN DIRECT: 0.2 mg/dL (ref 0.0–0.2)
BILIRUBIN INDIRECT: 0.5 mg/dL (ref 0.2–1.2)
Globulin: 2.4 g/dL (calc) (ref 1.9–3.7)
Total Bilirubin: 0.7 mg/dL (ref 0.2–1.2)
Total Protein: 6.5 g/dL (ref 6.1–8.1)

## 2017-07-29 ENCOUNTER — Encounter: Payer: Self-pay | Admitting: Family Medicine

## 2017-07-30 ENCOUNTER — Ambulatory Visit: Payer: Medicare Other | Admitting: Family Medicine

## 2017-07-31 ENCOUNTER — Encounter: Payer: Self-pay | Admitting: Family Medicine

## 2017-07-31 ENCOUNTER — Ambulatory Visit (INDEPENDENT_AMBULATORY_CARE_PROVIDER_SITE_OTHER): Payer: Medicare Other | Admitting: Family Medicine

## 2017-07-31 ENCOUNTER — Other Ambulatory Visit: Payer: Self-pay

## 2017-07-31 VITALS — BP 124/82 | HR 78 | Resp 16 | Ht 73.0 in | Wt 190.0 lb

## 2017-07-31 DIAGNOSIS — Z87898 Personal history of other specified conditions: Secondary | ICD-10-CM

## 2017-07-31 DIAGNOSIS — T733XXA Exhaustion due to excessive exertion, initial encounter: Secondary | ICD-10-CM | POA: Diagnosis not present

## 2017-07-31 DIAGNOSIS — E785 Hyperlipidemia, unspecified: Secondary | ICD-10-CM | POA: Diagnosis not present

## 2017-07-31 DIAGNOSIS — J309 Allergic rhinitis, unspecified: Secondary | ICD-10-CM | POA: Diagnosis not present

## 2017-07-31 DIAGNOSIS — Z23 Encounter for immunization: Secondary | ICD-10-CM | POA: Diagnosis not present

## 2017-07-31 DIAGNOSIS — I10 Essential (primary) hypertension: Secondary | ICD-10-CM

## 2017-07-31 NOTE — Patient Instructions (Addendum)
Wellness with nurse in 6 months  Pneumonia 23 today   EKG today because of fatigue with exertion and palpitations , and please send me a message regarding cardiologist  At [facility] want to be referred to  Increase vegetable intake   Follow up 1 year with me  We wll contact you as to when you need fasting labs  Please check your pharmacy for the shingrix vaccine  Thank you  for choosing [facility]. We consider it a privelige to serve you.  Delivering excellent health care in a caring and  compassionate way is our goal.  Partnering with you,  so that together we can achieve this goal is our strategy.

## 2017-08-04 ENCOUNTER — Encounter: Payer: Self-pay | Admitting: Family Medicine

## 2017-08-04 DIAGNOSIS — Z87898 Personal history of other specified conditions: Secondary | ICD-10-CM | POA: Insufficient documentation

## 2017-08-04 DIAGNOSIS — R5383 Other fatigue: Secondary | ICD-10-CM | POA: Insufficient documentation

## 2017-08-04 NOTE — Assessment & Plan Note (Signed)
Controlled, no change in medication DASH diet and commitment to daily physical activity for a minimum of 30 minutes discussed and encouraged, as a part of hypertension management. The importance of attaining a healthy weight is also discussed.  BP/Weight 07/31/2017 06/27/2017 01/01/2017 06/14/2016 03/29/2016 02/10/2016 74/82/7078  Systolic BP 675 449 201 007 121 975 883  Diastolic BP 82 78 76 76 70 81 82  Wt. (Lbs) 190 186 185.75 185.4 171 178 178  BMI 25.07 24.54 24.51 24.46 22.56 23.48 23.48

## 2017-08-04 NOTE — Assessment & Plan Note (Addendum)
2 week h/o exertional fatigue and poor exercise tolerance Office EKG done at visit is normal, will refer to cardiologist of patient's choice for further evaluation based on new concerning symptoms

## 2017-08-04 NOTE — Assessment & Plan Note (Signed)
Reports 2 week h/o intermittent palpitations, EKG at visit shows normal sinus rhythm, however patient reports new concerning symptoms , he is referred to cardiologist of his choice for further evaluation

## 2017-08-04 NOTE — Progress Notes (Signed)
Mark Sandoval     MRN: [id number]      DOB: 1951-04-05   HPI Mark Sandoval is here for follow up and re-evaluation of chronic medical conditions, medication management and review of any available recent lab and radiology data.  Preventive health is updated, specifically  Cancer screening and Immunization.   Questions or concerns regarding consultations  which the PT has had in the interim are  addressed. The PT denies any adverse reactions to current medications since the last visit.  2 week h/o increased exertional fatigue and poor exercise tolerance with "heart racing' , first noted while playing golf on a course that he was not accustomed  to playing on and which involved more waking than he usually does. States he has not recovered and is concerned, and that  wants this evaluated further. He is  uncertain as to how safe it actually is for him to be involved in an exercise program with these symptoms. Denies PND, orthopnea, light headedness, denies having nausea or diaphoresis with any of his acute symptoms of fatigue  ROS Denies recent fever or chills. C/o intermittent  sinus pressure, and nasal congestion, denies ear pain or sore throat. Denies chest congestion, productive cough or wheezing.  Denies abdominal pain, nausea, vomiting,diarrhea or constipation.   Denies dysuria, frequency, hesitancy or incontinence. Denies joint pain, swelling and limitation in mobility. Denies headaches, seizures, numbness, or tingling. Denies depression, anxiety or insomnia. Denies skin break down or rash.   PE  BP 124/82   Pulse 78   Resp 16   Ht 6\' 1"  (1.854 m)   Wt 190 lb (86.2 kg)   SpO2 98%   BMI 25.07 kg/m   Patient alert and oriented and in no cardiopulmonary distress.  HEENT: No facial asymmetry, EOMI,   oropharynx pink and moist.  Neck supple no JVD, no mass.  Chest: Clear to auscultation bilaterally.No reproducible chest wall pain CVS: S1, S2 no murmurs, no S3.Regular  rate. EKG" NSR Rate 65, no ischemic changes, no LVH ABD: Soft non tender.   Ext: No edema  MS: Adequate ROM spine, shoulders, hips and knees.  Skin: Intact, no ulcerations or rash noted.  Psych: Good eye contact, normal affect. Memory intact not anxious or depressed appearing.  CNS: CN 2-12 intact, power,  normal throughout.no focal deficits noted.   Assessment & Plan  Essential hypertension Controlled, no change in medication DASH diet and commitment to daily physical activity for a minimum of 30 minutes discussed and encouraged, as a part of hypertension management. The importance of attaining a healthy weight is also discussed.  BP/Weight 07/31/2017 06/27/2017 01/01/2017 06/14/2016 03/29/2016 02/10/2016 95/63/8756  Systolic BP 433 295 188 416 606 301 601  Diastolic BP 82 78 76 76 70 81 82  Wt. (Lbs) 190 186 185.75 185.4 171 178 178  BMI 25.07 24.54 24.51 24.46 22.56 23.48 23.48       Fatigue 2 week h/o exertional fatigue and poor exercise tolerance Office EKG done at visit is normal, will refer to cardiologist of patient's choice for further evaluation based on new concerning symptoms  History of palpitations Reports 2 week h/o intermittent palpitations, EKG at visit shows normal sinus rhythm, however patient reports new concerning symptoms , he is referred to cardiologist of his choice for further evaluation  Allergic rhinitis Controlled adequately with current medication used as needed, continue same  Dyslipidemia, goal LDL below 100 Hyperlipidemia:Low fat diet discussed and encouraged.   Lipid Panel  Lab Results  Component Value Date   CHOL 169 12/25/2016   HDL 61 12/25/2016   LDLCALC 91 12/25/2016   TRIG 78 12/25/2016   CHOLHDL 2.8 12/25/2016   At goal on  No medication Updated lab needed in 5 months

## 2017-08-04 NOTE — Assessment & Plan Note (Signed)
Controlled adequately with current medication used as needed, continue same

## 2017-08-04 NOTE — Assessment & Plan Note (Signed)
Hyperlipidemia:Low fat diet discussed and encouraged.   Lipid Panel  Lab Results  Component Value Date   CHOL 169 12/25/2016   HDL 61 12/25/2016   LDLCALC 91 12/25/2016   TRIG 78 12/25/2016   CHOLHDL 2.8 12/25/2016   At goal on  No medication Updated lab needed in 5 months

## 2017-08-06 ENCOUNTER — Encounter: Payer: Self-pay | Admitting: Family Medicine

## 2017-08-19 DIAGNOSIS — D709 Neutropenia, unspecified: Secondary | ICD-10-CM | POA: Diagnosis not present

## 2017-08-19 DIAGNOSIS — R718 Other abnormality of red blood cells: Secondary | ICD-10-CM | POA: Diagnosis not present

## 2017-08-19 DIAGNOSIS — D696 Thrombocytopenia, unspecified: Secondary | ICD-10-CM | POA: Diagnosis not present

## 2017-08-19 DIAGNOSIS — D708 Other neutropenia: Secondary | ICD-10-CM | POA: Diagnosis not present

## 2017-08-27 DIAGNOSIS — R0602 Shortness of breath: Secondary | ICD-10-CM | POA: Diagnosis not present

## 2017-08-27 DIAGNOSIS — R002 Palpitations: Secondary | ICD-10-CM | POA: Diagnosis not present

## 2017-08-27 DIAGNOSIS — I10 Essential (primary) hypertension: Secondary | ICD-10-CM | POA: Diagnosis not present

## 2017-08-27 DIAGNOSIS — R5383 Other fatigue: Secondary | ICD-10-CM | POA: Diagnosis not present

## 2017-09-02 DIAGNOSIS — R5383 Other fatigue: Secondary | ICD-10-CM | POA: Diagnosis not present

## 2017-09-02 DIAGNOSIS — R0600 Dyspnea, unspecified: Secondary | ICD-10-CM | POA: Diagnosis not present

## 2017-09-02 DIAGNOSIS — R002 Palpitations: Secondary | ICD-10-CM | POA: Diagnosis not present

## 2017-09-02 DIAGNOSIS — I10 Essential (primary) hypertension: Secondary | ICD-10-CM | POA: Diagnosis not present

## 2017-09-02 DIAGNOSIS — R0602 Shortness of breath: Secondary | ICD-10-CM | POA: Diagnosis not present

## 2017-12-03 DIAGNOSIS — I10 Essential (primary) hypertension: Secondary | ICD-10-CM | POA: Diagnosis not present

## 2017-12-03 DIAGNOSIS — R002 Palpitations: Secondary | ICD-10-CM | POA: Diagnosis not present

## 2017-12-03 DIAGNOSIS — R5383 Other fatigue: Secondary | ICD-10-CM | POA: Diagnosis not present

## 2017-12-03 DIAGNOSIS — R0602 Shortness of breath: Secondary | ICD-10-CM | POA: Diagnosis not present

## 2018-01-03 ENCOUNTER — Encounter: Payer: Self-pay | Admitting: Family Medicine

## 2018-01-04 ENCOUNTER — Other Ambulatory Visit: Payer: Self-pay | Admitting: Family Medicine

## 2018-01-04 MED ORDER — AMLODIPINE BESYLATE 5 MG PO TABS: 5.0000 mg | ORAL_TABLET | Freq: Every day | ORAL | 3 refills | Status: DC

## 2018-01-04 NOTE — Progress Notes (Signed)
Amlodipine 5 mg sent.

## 2018-01-07 ENCOUNTER — Encounter: Payer: Self-pay | Admitting: Family Medicine

## 2018-01-10 ENCOUNTER — Ambulatory Visit (INDEPENDENT_AMBULATORY_CARE_PROVIDER_SITE_OTHER): Payer: Medicare Other

## 2018-01-10 DIAGNOSIS — Z23 Encounter for immunization: Secondary | ICD-10-CM

## 2018-01-30 ENCOUNTER — Ambulatory Visit: Payer: Medicare Other

## 2018-01-30 ENCOUNTER — Ambulatory Visit (INDEPENDENT_AMBULATORY_CARE_PROVIDER_SITE_OTHER): Payer: Medicare Other

## 2018-01-30 VITALS — BP 120/82 | HR 71 | Resp 15 | Ht 73.0 in | Wt 190.0 lb

## 2018-01-30 DIAGNOSIS — E785 Hyperlipidemia, unspecified: Secondary | ICD-10-CM | POA: Diagnosis not present

## 2018-01-30 DIAGNOSIS — I10 Essential (primary) hypertension: Secondary | ICD-10-CM | POA: Diagnosis not present

## 2018-01-30 DIAGNOSIS — D72819 Decreased white blood cell count, unspecified: Secondary | ICD-10-CM | POA: Diagnosis not present

## 2018-01-30 DIAGNOSIS — Z0000 Encounter for general adult medical examination without abnormal findings: Secondary | ICD-10-CM

## 2018-01-30 NOTE — Progress Notes (Signed)
Subjective:   Faust Thorington Borgmeyer is a 67 y.o. male who presents for Medicare Annual/Subsequent preventive examination.  Review of Systems:   Cardiac Risk Factors include: advanced age (>51men, >64 women);hypertension;dyslipidemia     Objective:    Vitals: BP 120/82   Pulse 71   Resp 15   Ht 6\' 1"  (1.854 m)   SpO2 100%   BMI 25.07 kg/m   Body mass index is 25.07 kg/m.  Advanced Directives 01/30/2018 05/25/2016 05/18/2016 05/10/2016 03/29/2016  Does Patient Have a Medical Advance Directive? Yes Yes Yes Yes Yes  Type of Advance Directive - (No Data) (No Data) (No Data) [employer];Living will  Does patient want to make changes to medical advance directive? - - - No - Patient declined -  Copy of [place] in Chart? - - - - No - copy requested    Tobacco Social History   Tobacco Use  Smoking Status Never Smoker  Smokeless Tobacco Never Used     Counseling given: Not Answered   Clinical Intake:  Pre-visit preparation completed: Yes  Pain : No/denies pain Pain Score: 0-No pain     Nutritional Status: BMI 25 -29 Overweight Diabetes: No  How often do you need to have someone help you when you read instructions, pamphlets, or other written materials from your doctor or pharmacy?: 1 - Never What is the last grade level you completed in school?: college degree  Interpreter Needed?: No  Information entered by :: Jessi Jessop LPN   Past Medical History:  Diagnosis Date  . Dyslipidemia   . GERD (gastroesophageal reflux disease)   . History of seasonal allergies   . Hypertension   . Leukopenia   . Lump or mass in breast    benign  . Thrombocytopenia ([place])    Past Surgical History:  Procedure Laterality Date  . athroscopy of left knee  2002  . CHOLECYSTECTOMY  2007  . COLONOSCOPY N/A 03/29/2016   Procedure: COLONOSCOPY;  Surgeon: Rogene Houston, MD;  Location: [facility];  Service: Endoscopy;  Laterality: N/A;  1030  .  removal of benign left breast lump  09/06/07   Family History  Problem Relation Age of Onset  . Cancer Mother        hematologic cancer   . Hypertension Mother   . Alcohol abuse Father    Social History   Socioeconomic History  . Marital status: Married    Spouse name: Not on file  . Number of children: 4  . Years of education: DDS  . Highest education level: Not on file  Occupational History  . Occupation: Orthoptist: SELF-EMPLOYED    Comment: retired   Scientific laboratory technician  . Financial resource strain: Not hard at all  . Food insecurity:    Worry: Never true    Inability: Never true  . Transportation needs:    Medical: No    Non-medical: No  Tobacco Use  . Smoking status: Never Smoker  . Smokeless tobacco: Never Used  Substance and Sexual Activity  . Alcohol use: Yes    Alcohol/week: 0.0 standard drinks    Comment: occasional  . Drug use: No  . Sexual activity: Yes  Lifestyle  . Physical activity:    Days per week: 0 days    Minutes per session: 0 min  . Stress: Not at all  Relationships  . Social connections:    Talks on phone: Three times a week  Gets together: Three times a week    Attends religious service: More than 4 times per year    Active member of club or organization: Yes    Attends meetings of clubs or organizations: More than 4 times per year    Relationship status: Married  Other Topics Concern  . Not on file  Social History Narrative  . Not on file    Outpatient Encounter Medications as of 01/30/2018  Medication Sig  . amLODipine (NORVASC) 5 MG tablet Take 1 tablet (5 mg total) by mouth daily.  [name] aspirin (RA ASPIRIN EC ADULT LOW ST) 81 MG EC tablet Take 81 mg by mouth daily.    [name] azelastine (ASTELIN) 0.1 % nasal spray Place 2 sprays into both nostrils 2 (two) times daily. Use in each nostril as directed  . Multiple Vitamin (MULTIVITAMIN WITH MINERALS) TABS tablet Take 1 tablet by mouth daily.  . [DISCONTINUED] amLODipine (NORVASC) 5  MG tablet Take 1 tablet (5 mg total) by mouth daily.   No facility-administered encounter medications on file as of 01/30/2018.     Activities of Daily Living In your present state of health, do you have any difficulty performing the following activities: 01/30/2018  Hearing? N  Vision? N  Difficulty concentrating or making decisions? N  Walking or climbing stairs? N  Dressing or bathing? N  Doing errands, shopping? N  Preparing Food and eating ? N  Using the Toilet? N  In the past six months, have you accidently leaked urine? N  Do you have problems with loss of bowel control? N  Managing your Medications? N  Managing your Finances? N  Housekeeping or managing your Housekeeping? N  Some recent data might be hidden    Patient Care Team: Fayrene Helper, MD as PCP - General   Assessment:   This is a routine wellness examination for [place].  Exercise Activities and Dietary recommendations Current Exercise Habits: The patient does not participate in regular exercise at present, Exercise limited by: None identified  Goals    . Follow up with Primary Care Provider     Increase yearly visits to bi-yearly per pt     . Increase physical activity (pt-stated)     Retired and currently gets very little physical activity. Discussed a goal of walking for 20 minutes for 3 days a week to start    . Prevent falls     Needs to practice chair exercises to strengthen legs and keep them strong and reduce likelyhood of falls        Fall Risk Fall Risk  01/30/2018 01/01/2017 06/14/2016 01/26/2016  Falls in the past year? No No No No   Is the patient's home free of loose throw rugs in walkways, pet beds, electrical cords, etc?   yes      Grab bars in the bathroom? yes      Handrails on the stairs?   yes      Adequate lighting?   yes  Timed Get Up and Go Performed: performed get up and go in 6 seconds. Normal score   Depression Screen PHQ 2/9 Scores 01/30/2018 07/31/2017 01/01/2017  06/14/2016  PHQ - 2 Score 0 0 0 0  PHQ- 9 Score - - - -    Cognitive Function     6CIT Screen 01/30/2018  What Year? 0 points  What month? 0 points  What time? 0 points  Count back from 20 0 points  Months in reverse 2 points  Repeat phrase 0 points  Total Score 2    Immunization History  Administered Date(s) Administered  . H1N1 02/06/2008  . Influenza Split 02/02/2011, 02/15/2012  . Influenza Whole 01/17/2007, 01/13/2010  . Influenza, High Dose Seasonal PF 01/10/2018  . Influenza,inj,Quad PF,6+ Mos 01/15/2014, 02/02/2015, 01/05/2016, 01/01/2017  . Pneumococcal Conjugate-13 01/26/2016  . Pneumococcal Polysaccharide-23 07/31/2017  . Td 05/03/2006  . Tdap 01/01/2017  . Zoster 02/02/2011    Qualifies for Shingles Vaccine? Will ask his insurance if covered   Screening Tests Health Maintenance  Topic Date Due  . COLONOSCOPY  03/29/2026  . TETANUS/TDAP  01/02/2027  . INFLUENZA VACCINE  Completed  . Hepatitis C Screening  Completed  . PNA vac Low Risk Adult  Completed   Cancer Screenings: Lung: Low Dose CT Chest recommended if Age 80-80 years, 30 pack-year currently smoking OR have quit w/in 15years. Patient does not qualify. Colorectal: up to date   Additional Screenings:  Hepatitis C Screening: complete      Plan:     I have personally reviewed and noted the following in the patient's chart:   . Medical and social history . Use of alcohol, tobacco or illicit drugs  . Current medications and supplements . Functional ability and status . Nutritional status . Physical activity . Advanced directives . List of other physicians . Hospitalizations, surgeries, and ER visits in previous 12 months . Vitals . Screenings to include cognitive, depression, and falls . Referrals and appointments  In addition, I have reviewed and discussed with patient certain preventive protocols, quality metrics, and best practice recommendations. A written personalized care plan for  preventive services as well as general preventive health recommendations were provided to patient.     Kate Sable, LPN, LPN  [id number]/53/[id number]

## 2018-01-30 NOTE — Patient Instructions (Addendum)
[name] , Thank you for taking time to come for your Medicare Wellness Visit. I appreciate your ongoing commitment to your health goals. Please review the following plan we discussed and let me know if I can assist you in the future.    Please bring a copy of your advanced directives  Ask insurance if shingrix is covered      Screening recommendations/referrals: Colonoscopy: up to date  Recommended yearly ophthalmology/optometry visit for glaucoma screening and checkup Recommended yearly dental visit for hygiene and checkup  Vaccinations: Influenza vaccine: up to date  Pneumococcal vaccine:  Tdap vaccine: up to date  Shingles vaccine:   Advanced directives: has this complete and will bring copy to next visit   Conditions/risks identified: Needs to increase physical activity  Next appointment: wellness in 1 year   Preventive Care 29 Years and Older, Male Preventive care refers to lifestyle choices and visits with your health care provider that can promote health and wellness. What does preventive care include?  A yearly physical exam. This is also called an annual well check.  Dental exams once or twice a year.  Routine eye exams. Ask your health care provider how often you should have your eyes checked.  Personal lifestyle choices, including:  Daily care of your teeth and gums.  Regular physical activity.  Eating a healthy diet.  Avoiding tobacco and drug use.  Limiting alcohol use.  Practicing safe sex.  Taking low doses of aspirin every day.  Taking vitamin and mineral supplements as recommended by your health care provider. What happens during an annual well check? The services and screenings done by your health care provider during your annual well check will depend on your age, overall health, lifestyle risk factors, and family history of disease. Counseling  Your health care provider may ask you questions about your:  Alcohol use.  Tobacco  use.  Drug use.  Emotional well-being.  Home and relationship well-being.  Sexual activity.  Eating habits.  History of falls.  Memory and ability to understand (cognition).  Work and work Statistician. Screening  You may have the following tests or measurements:  Height, weight, and BMI.  Blood pressure.  Lipid and cholesterol levels. These may be checked every 5 years, or more frequently if you are over 45 years old.  Skin check.  Lung cancer screening. You may have this screening every year starting at age 56 if you have a 30-pack-year history of smoking and currently smoke or have quit within the past 15 years.  Fecal occult blood test (FOBT) of the stool. You may have this test every year starting at age 83.  Flexible sigmoidoscopy or colonoscopy. You may have a sigmoidoscopy every 5 years or a colonoscopy every 10 years starting at age 26.  Prostate cancer screening. Recommendations will vary depending on your family history and other risks.  Hepatitis C blood test.  Hepatitis B blood test.  Sexually transmitted disease (STD) testing.  Diabetes screening. This is done by checking your blood sugar (glucose) after you have not eaten for a while (fasting). You may have this done every 1-3 years.  Abdominal aortic aneurysm (AAA) screening. You may need this if you are a current or former smoker.  Osteoporosis. You may be screened starting at age 79 if you are at high risk. Talk with your health care provider about your test results, treatment options, and if necessary, the need for more tests. Vaccines  Your health care provider may recommend certain vaccines,  such as:  Influenza vaccine. This is recommended every year.  Tetanus, diphtheria, and acellular pertussis (Tdap, Td) vaccine. You may need a Td booster every 10 years.  Zoster vaccine. You may need this after age 54.  Pneumococcal 13-valent conjugate (PCV13) vaccine. One dose is recommended after age  58.  Pneumococcal polysaccharide (PPSV23) vaccine. One dose is recommended after age 57. Talk to your health care provider about which screenings and vaccines you need and how often you need them. This information is not intended to replace advice given to you by your health care provider. Make sure you discuss any questions you have with your health care provider. Document Released: 04/29/2015 Document Revised: 12/21/2015 Document Reviewed: 02/01/2015 Elsevier Interactive Patient Education  2017 [employer] Prevention in the Home Falls can cause injuries. They can happen to people of all ages. There are many things you can do to make your home safe and to help prevent falls. What can I do on the outside of my home?  Regularly fix the edges of walkways and driveways and fix any cracks.  Remove anything that might make you trip as you walk through a door, such as a raised step or threshold.  Trim any bushes or trees on the path to your home.  Use bright outdoor lighting.  Clear any walking paths of anything that might make someone trip, such as rocks or tools.  Regularly check to see if handrails are loose or broken. Make sure that both sides of any steps have handrails.  Any raised decks and porches should have guardrails on the edges.  Have any leaves, snow, or ice cleared regularly.  Use sand or salt on walking paths during winter.  Clean up any spills in your garage right away. This includes oil or grease spills. What can I do in the bathroom?  Use night lights.  Install grab bars by the toilet and in the tub and shower. Do not use towel bars as grab bars.  Use non-skid mats or decals in the tub or shower.  If you need to sit down in the shower, use a plastic, non-slip stool.  Keep the floor dry. Clean up any water that spills on the floor as soon as it happens.  Remove soap buildup in the tub or shower regularly.  Attach bath mats securely with double-sided  non-slip rug tape.  Do not have throw rugs and other things on the floor that can make you trip. What can I do in the bedroom?  Use night lights.  Make sure that you have a light by your bed that is easy to reach.  Do not use any sheets or blankets that are too big for your bed. They should not hang down onto the floor.  Have a firm chair that has side arms. You can use this for support while you get dressed.  Do not have throw rugs and other things on the floor that can make you trip. What can I do in the kitchen?  Clean up any spills right away.  Avoid walking on wet floors.  Keep items that you use a lot in easy-to-reach places.  If you need to reach something above you, use a strong step stool that has a grab bar.  Keep electrical cords out of the way.  Do not use floor polish or wax that makes floors slippery. If you must use wax, use non-skid floor wax.  Do not have throw rugs and other things on  the floor that can make you trip. What can I do with my stairs?  Do not leave any items on the stairs.  Make sure that there are handrails on both sides of the stairs and use them. Fix handrails that are broken or loose. Make sure that handrails are as long as the stairways.  Check any carpeting to make sure that it is firmly attached to the stairs. Fix any carpet that is loose or worn.  Avoid having throw rugs at the top or bottom of the stairs. If you do have throw rugs, attach them to the floor with carpet tape.  Make sure that you have a light switch at the top of the stairs and the bottom of the stairs. If you do not have them, ask someone to add them for you. What else can I do to help prevent falls?  Wear shoes that:  Do not have high heels.  Have rubber bottoms.  Are comfortable and fit you well.  Are closed at the toe. Do not wear sandals.  If you use a stepladder:  Make sure that it is fully opened. Do not climb a closed stepladder.  Make sure that both  sides of the stepladder are locked into place.  Ask someone to hold it for you, if possible.  Clearly mark and make sure that you can see:  Any grab bars or handrails.  First and last steps.  Where the edge of each step is.  Use tools that help you move around (mobility aids) if they are needed. These include:  Canes.  Walkers.  Scooters.  Crutches.  Turn on the lights when you go into a dark area. Replace any light bulbs as soon as they burn out.  Set up your furniture so you have a clear path. Avoid moving your furniture around.  If any of your floors are uneven, fix them.  If there are any pets around you, be aware of where they are.  Review your medicines with your doctor. Some medicines can make you feel dizzy. This can increase your chance of falling. Ask your doctor what other things that you can do to help prevent falls. This information is not intended to replace advice given to you by your health care provider. Make sure you discuss any questions you have with your health care provider. Document Released: 01/27/2009 Document Revised: 09/08/2015 Document Reviewed: 05/07/2014 Elsevier Interactive Patient Education  2017 [employer].

## 2018-02-17 ENCOUNTER — Ambulatory Visit: Payer: Medicare Other

## 2018-02-24 DIAGNOSIS — E785 Hyperlipidemia, unspecified: Secondary | ICD-10-CM | POA: Diagnosis not present

## 2018-02-24 DIAGNOSIS — I10 Essential (primary) hypertension: Secondary | ICD-10-CM | POA: Diagnosis not present

## 2018-02-24 DIAGNOSIS — D72819 Decreased white blood cell count, unspecified: Secondary | ICD-10-CM | POA: Diagnosis not present

## 2018-02-25 ENCOUNTER — Encounter: Payer: Self-pay | Admitting: Family Medicine

## 2018-02-25 LAB — CBC WITH DIFFERENTIAL/PLATELET
BASOS ABS: 61 {cells}/uL (ref 0–200)
Basophils Relative: 1.6 %
EOS PCT: 2.9 %
Eosinophils Absolute: 110 cells/uL (ref 15–500)
HCT: 42.6 % (ref 38.5–50.0)
HEMOGLOBIN: 13.7 g/dL (ref 13.2–17.1)
Lymphs Abs: 1292 cells/uL (ref 850–3900)
MCH: 22.6 pg — ABNORMAL LOW (ref 27.0–33.0)
MCHC: 32.2 g/dL (ref 32.0–36.0)
MCV: 70.2 fL — ABNORMAL LOW (ref 80.0–100.0)
MONOS PCT: 8.2 %
MPV: 11.2 fL (ref 7.5–12.5)
NEUTROS ABS: 2025 {cells}/uL (ref 1500–7800)
NEUTROS PCT: 53.3 %
PLATELETS: 153 10*3/uL (ref 140–400)
RBC: 6.07 10*6/uL — ABNORMAL HIGH (ref 4.20–5.80)
RDW: 17.5 % — ABNORMAL HIGH (ref 11.0–15.0)
Total Lymphocyte: 34 %
WBC mixed population: 312 cells/uL (ref 200–950)
WBC: 3.8 10*3/uL (ref 3.8–10.8)

## 2018-02-25 LAB — HEPATIC FUNCTION PANEL
AG RATIO: 1.9 (calc) (ref 1.0–2.5)
ALKALINE PHOSPHATASE (APISO): 109 U/L (ref 40–115)
ALT: 23 U/L (ref 9–46)
AST: 22 U/L (ref 10–35)
Albumin: 4.3 g/dL (ref 3.6–5.1)
Bilirubin, Direct: 0.1 mg/dL (ref 0.0–0.2)
GLOBULIN: 2.3 g/dL (ref 1.9–3.7)
Indirect Bilirubin: 0.6 mg/dL (calc) (ref 0.2–1.2)
TOTAL PROTEIN: 6.6 g/dL (ref 6.1–8.1)
Total Bilirubin: 0.7 mg/dL (ref 0.2–1.2)

## 2018-02-25 LAB — BASIC METABOLIC PANEL WITH GFR
BUN: 17 mg/dL (ref 7–25)
CALCIUM: 9.1 mg/dL (ref 8.6–10.3)
CHLORIDE: 104 mmol/L (ref 98–110)
CO2: 30 mmol/L (ref 20–32)
Creat: 1.14 mg/dL (ref 0.70–1.25)
GFR, EST AFRICAN AMERICAN: 77 mL/min/{1.73_m2} (ref >=60)
GFR, Est Non African American: 66 mL/min/{1.73_m2} (ref >=60)
Glucose, Bld: 91 mg/dL (ref 65–99)
POTASSIUM: 3.9 mmol/L (ref 3.5–5.3)
Sodium: 141 mmol/L (ref 135–146)

## 2018-02-25 LAB — LIPID PANEL
CHOL/HDL RATIO: 3.2 (calc) (ref <5.0)
Cholesterol: 203 mg/dL — ABNORMAL HIGH (ref <200)
HDL: 64 mg/dL (ref >40)
LDL CHOLESTEROL (CALC): 123 mg/dL — AB
NON-HDL CHOLESTEROL (CALC): 139 mg/dL — AB (ref <130)
TRIGLYCERIDES: 65 mg/dL (ref <150)

## 2018-02-27 ENCOUNTER — Encounter: Payer: Self-pay | Admitting: Family Medicine

## 2018-02-27 ENCOUNTER — Ambulatory Visit: Payer: Medicare Other | Admitting: Family Medicine

## 2018-02-27 ENCOUNTER — Ambulatory Visit (INDEPENDENT_AMBULATORY_CARE_PROVIDER_SITE_OTHER): Payer: Medicare Other | Admitting: Family Medicine

## 2018-02-27 VITALS — BP 122/80 | HR 83 | Resp 16 | Ht 73.0 in | Wt 189.0 lb

## 2018-02-27 DIAGNOSIS — J309 Allergic rhinitis, unspecified: Secondary | ICD-10-CM | POA: Diagnosis not present

## 2018-02-27 DIAGNOSIS — E785 Hyperlipidemia, unspecified: Secondary | ICD-10-CM

## 2018-02-27 DIAGNOSIS — I10 Essential (primary) hypertension: Secondary | ICD-10-CM

## 2018-02-27 DIAGNOSIS — Z23 Encounter for immunization: Secondary | ICD-10-CM | POA: Diagnosis not present

## 2018-02-27 DIAGNOSIS — K5730 Diverticulosis of large intestine without perforation or abscess without bleeding: Secondary | ICD-10-CM | POA: Diagnosis not present

## 2018-02-27 MED ORDER — UNABLE TO FIND: 0 refills | Status: DC

## 2018-02-27 NOTE — Assessment & Plan Note (Signed)
Denies rectal bleeding

## 2018-02-27 NOTE — Assessment & Plan Note (Signed)
Controlled, no change in medication.dsd DASH diet and commitment to daily physical activity for a minimum of 30 minutes discussed and encouraged, as a part of hypertension management. The importance of attaining a healthy weight is also discussed.  BP/Weight 02/27/2018 01/30/2018 07/31/2017 06/27/2017 01/01/2017 06/14/2016 46/07/7996  Systolic BP 721 587 276 184 859 276 394  Diastolic BP 80 82 82 78 76 76 70  Wt. (Lbs) 189 190 190 186 185.75 185.4 171  BMI 24.94 25.07 25.07 24.54 24.51 24.46 22.56

## 2018-02-27 NOTE — Assessment & Plan Note (Signed)
Needs exercise commitment

## 2018-02-27 NOTE — Assessment & Plan Note (Signed)
Hyperlipidemia:Low fat diet discussed and encouraged.   Lipid Panel  Lab Results  Component Value Date   CHOL 203 (H) 02/24/2018   HDL 64 02/24/2018   LDLCALC 123 (H) 02/24/2018   TRIG 65 02/24/2018   CHOLHDL 3.2 02/24/2018   Needs to changer diet to improve LDL

## 2018-02-27 NOTE — Patient Instructions (Addendum)
F/U with MD with in 6 month, call if you need me before  No medication changes  Please reduce the fat in your diet  Please make that exercise commitment   Fasting lipid, chem 7 and EGFr,  1 week before next visit.  Please return 3 stool cards  Shingrix vaccines recommended, script will be given  Thank you  for choosing [facility]. We consider it a privelige to serve you.  Delivering excellent health care in a caring and  compassionate way is our goal.  Partnering with you,  so that together we can achieve this goal is our strategy.

## 2018-02-27 NOTE — Progress Notes (Signed)
   Breck Maryland Scally     MRN: [id number]      DOB: 1951-01-20   HPI Mr. Huskins is here for follow up and re-evaluation of chronic medical conditions, medication management and review of any available recent lab and radiology data.  Preventive health is updated, specifically  Cancer screening and Immunization.   Questions or concerns regarding consultations or procedures which the PT has had in the interim are  addressed. The PT denies any adverse reactions to current medications since the last visit.  There are no new concerns.  Needs to commit to exercise Concerned about prostate cancer due to its prevalence sees 2 urologists by choice as a result   ROS Denies recent fever or chills. Denies sinus pressure, nasal congestion, ear pain or sore throat. Denies chest congestion, productive cough or wheezing. Denies chest pains, palpitations and leg swelling Denies abdominal pain, nausea, vomiting,diarrhea or constipation.   Denies dysuria, frequency, hesitancy or incontinence. Denies uncontrolled  joint pain, swelling and limitation in mobility.Still has neck pain but no definitive management recommended Denies headaches, seizures, numbness, or tingling. Denies depression, anxiety or insomnia. Denies skin break down or rash.   PE  BP 122/80   Pulse 83   Resp 16   Ht 6\' 1"  (1.854 m)   Wt 189 lb (85.7 kg)   BMI 24.94 kg/m   Patient alert and oriented and in no cardiopulmonary distress.  HEENT: No facial asymmetry, EOMI,   oropharynx pink and moist.  Neck supple no JVD, no mass.  Chest: Clear to auscultation bilaterally.  CVS: S1, S2 no murmurs, no S3.Regular rate.  ABD: Soft non tender.   Ext: No edema  MS: Adequate ROM spine, shoulders, hips and knees.  Skin: Intact, no ulcerations or rash noted.  Psych: Good eye contact, normal affect. Memory intact not anxious or depressed appearing.  CNS: CN 2-12 intact, power,  normal throughout.no focal deficits  noted.   Assessment & Plan  Essential hypertension Controlled, no change in medication.dsd DASH diet and commitment to daily physical activity for a minimum of 30 minutes discussed and encouraged, as a part of hypertension management. The importance of attaining a healthy weight is also discussed.  BP/Weight 02/27/2018 01/30/2018 07/31/2017 06/27/2017 01/01/2017 06/14/2016 36/64/4034  Systolic BP 742 595 638 756 433 295 188  Diastolic BP 80 82 82 78 76 76 70  Wt. (Lbs) 189 190 190 186 185.75 185.4 171  BMI 24.94 25.07 25.07 24.54 24.51 24.46 22.56       Allergic rhinitis No current flare occurs twice yearly, Spring and Fall generally  Dyslipidemia, goal LDL below 100 Hyperlipidemia:Low fat diet discussed and encouraged.   Lipid Panel  Lab Results  Component Value Date   CHOL 203 (H) 02/24/2018   HDL 64 02/24/2018   LDLCALC 123 (H) 02/24/2018   TRIG 65 02/24/2018   CHOLHDL 3.2 02/24/2018   Needs to changer diet to improve LDL    Fatigue Needs exercise commitment  Diverticulosis of colon without hemorrhage Denies rectal bleeding

## 2018-02-27 NOTE — Assessment & Plan Note (Signed)
No current flare occurs twice yearly, Spring and Fall generally

## 2018-04-10 ENCOUNTER — Other Ambulatory Visit: Payer: Self-pay

## 2018-04-10 ENCOUNTER — Encounter (HOSPITAL_COMMUNITY): Payer: Self-pay | Admitting: *Deleted

## 2018-04-10 ENCOUNTER — Emergency Department (HOSPITAL_COMMUNITY)
Admission: EM | Admit: 2018-04-10 | Discharge: 2018-04-10 | Disposition: A | Discharge status: Home / Self Care | Payer: Medicare Other | Attending: Emergency Medicine | Admitting: Emergency Medicine

## 2018-04-10 DIAGNOSIS — R319 Hematuria, unspecified: Secondary | ICD-10-CM | POA: Diagnosis not present

## 2018-04-10 DIAGNOSIS — Z79899 Other long term (current) drug therapy: Secondary | ICD-10-CM | POA: Insufficient documentation

## 2018-04-10 DIAGNOSIS — I10 Essential (primary) hypertension: Secondary | ICD-10-CM | POA: Insufficient documentation

## 2018-04-10 DIAGNOSIS — R300 Dysuria: Secondary | ICD-10-CM | POA: Diagnosis present

## 2018-04-10 LAB — URINALYSIS, ROUTINE W REFLEX MICROSCOPIC
BACTERIA UA: NONE SEEN
Bilirubin Urine: NEGATIVE
Glucose, UA: NEGATIVE mg/dL
KETONES UR: NEGATIVE mg/dL
LEUKOCYTES UA: NEGATIVE
Nitrite: NEGATIVE
PH: 7 (ref 5.0–8.0)
PROTEIN: NEGATIVE mg/dL
RBC / HPF: >50 RBC/hpf — ABNORMAL HIGH (ref 0–5)
Specific Gravity, Urine: 1.005 (ref 1.005–1.030)

## 2018-04-10 NOTE — ED Notes (Signed)
Patient given discharge instruction, verbalized understand. Patient ambulatory out of the department.  

## 2018-04-10 NOTE — ED Triage Notes (Signed)
Pt states he woke up this am with blood in his urine and painful urination

## 2018-04-10 NOTE — ED Provider Notes (Signed)
[facility] EMERGENCY DEPARTMENT Provider Note   CSN: [id number] Arrival date & time: 04/10/18  0602     History   Chief Complaint Chief Complaint  Patient presents with  . Hematuria    HPI Mark Sandoval is a 67 y.o. male.  HPI Here for evaluation of hematuria which started this morning accompanied with dysuria, and urinary frequency.  No fever, chills, nausea or vomiting.  He is being monitored by urology for nonspecific renal cysts.  Last seen there, May 2018.  He did not follow-up this year for repeat ultrasound as suggested.  He denies back pain, abdominal pain, weakness or dizziness.  There are no other known modifying factors.   Past Medical History:  Diagnosis Date  . Dyslipidemia   . GERD (gastroesophageal reflux disease)   . History of seasonal allergies   . Hypertension   . Leukopenia   . Lump or mass in breast    benign  . Thrombocytopenia [facility])     Patient Active Problem List   Diagnosis Date Noted  . Fatigue 08/04/2017  . History of palpitations 08/04/2017  . Diverticulosis of colon without hemorrhage 05/03/2016  . Dyslipidemia, goal LDL below 100 01/15/2014  . Carpal tunnel syndrome 02/14/2013  . Neck pain on right side 02/02/2011  . Leukocytopenia 12/08/2007  . Allergic rhinitis 12/08/2007  . Essential hypertension 11/21/2007    Past Surgical History:  Procedure Laterality Date  . athroscopy of left knee  2002  . CHOLECYSTECTOMY  2007  . COLONOSCOPY N/A 03/29/2016   Procedure: COLONOSCOPY;  Surgeon: Rogene Houston, MD;  Location: [facility] END[facility]ITE;  Service: Endoscopy;  Laterality: N/A;  1030  . removal of benign left breast lump  09/06/07        Home Medications    Prior to Admission medications   Medication Sig Start Date End Date Taking? Authorizing Provider  amLODipine (NORVASC) 5 MG tablet Take 1 tablet (5 mg total) by mouth daily. 01/04/18   Fayrene Helper, MD  azelastine (ASTELIN) 0.1 % nasal spray Place 2 sprays into both  nostrils 2 (two) times daily. Use in each nostril as directed 01/01/17   Fayrene Helper, MD  Multiple Vitamin (MULTIVITAMIN WITH MINERALS) TABS tablet Take 1 tablet by mouth daily.    [provider]  UNABLE TO FIND Shringrix Vaccine 02/27/18   Fayrene Helper, MD    Family History Family History  Problem Relation Age of Onset  . Cancer Mother        hematologic cancer   . Hypertension Mother   . Alcohol abuse Father     Social History Social History   Tobacco Use  . Smoking status: Never Smoker  . Smokeless tobacco: Never Used  Substance Use Topics  . Alcohol use: Yes    Alcohol/week: 0.0 standard drinks    Comment: occasional  . Drug use: No     Allergies   Compazine   Review of Systems Review of Systems  All other systems reviewed and are negative.    Physical Exam Updated Vital Signs BP (!) 131/97 (BP Location: Right Arm)   Pulse 85   Temp 97.6 F (36.4 C) (Oral)   Resp 18   Ht 6\' 1"  (1.854 m)   Wt 83.9 kg   SpO2 100%   BMI 24.41 kg/m   Physical Exam Vitals signs and nursing note reviewed.  Constitutional:      Appearance: He is well-developed. He is not ill-appearing or diaphoretic.  HENT:  Head: Normocephalic and atraumatic.     Right Ear: External ear normal.     Left Ear: External ear normal.  Eyes:     Conjunctiva/sclera: Conjunctivae normal.     Pupils: Pupils are equal, round, and reactive to light.  Neck:     Musculoskeletal: Normal range of motion and neck supple.     Trachea: Phonation normal.  Cardiovascular:     Rate and Rhythm: Normal rate and regular rhythm.     Heart sounds: Normal heart sounds.  Pulmonary:     Effort: Pulmonary effort is normal.     Breath sounds: Normal breath sounds.  Abdominal:     Palpations: Abdomen is soft.     Tenderness: There is no abdominal tenderness.  Musculoskeletal: Normal range of motion.  Skin:    General: Skin is warm and dry.  Neurological:     Mental Status: He is  alert and oriented to person, place, and time.     Cranial Nerves: No cranial nerve deficit.     Sensory: No sensory deficit.     Motor: No abnormal muscle tone.     Coordination: Coordination normal.  Psychiatric:        Behavior: Behavior normal.        Thought Content: Thought content normal.        Judgment: Judgment normal.      ED Treatments / Results  Labs (all labs ordered are listed, but only abnormal results are displayed) Labs Reviewed  URINALYSIS, ROUTINE W REFLEX MICROSCOPIC - Abnormal; Notable for the following components:      Result Value   Hgb urine dipstick LARGE (*)    RBC / HPF >50 (*)    All other components within normal limits    EKG None  Radiology No results found.  Procedures Procedures (including critical care time)  Medications Ordered in ED Medications - No data to display   Initial Impression / Assessment and Plan / ED Course  I have reviewed the triage vital signs and the nursing notes.  Pertinent labs & imaging results that were available during my care of the patient were reviewed by me and considered in my medical decision making (see chart for details).      Patient Vitals for the past 24 hrs:  BP Temp Temp src Pulse Resp SpO2 Height Weight  04/10/18 0627 - - - - - - 6\' 1"  (1.854 m) 83.9 kg  04/10/18 0626 (!) 131/97 97.6 F (36.4 C) Oral 85 18 100 % - -    8:08 AM Reevaluation with update and discussion. After initial assessment and treatment, an updated evaluation reveals no change in clinical status.  Findings discussed with the patient and his family members, all questions were answered. Daleen Bo   Medical Decision Making: Episode of hematuria, today, without signs and symptoms for acute infectious process.  Urinalysis does not indicate infection.  Patient with known prior nonspecific renal cysts, being followed expectantly, by urology.  Patient is nontoxic.  No indication for requirement of further ED evaluation, at  this time.  Patient prefers to follow-up with his urologist, as previously planned.  He will call today for a follow-up appointment.  CRITICAL CARE-no Performed by: Daleen Bo  Nursing Notes Reviewed/ Care Coordinated Applicable Imaging Reviewed Interpretation of Laboratory Data incorporated into ED treatment  The patient appears reasonably screened and/or stabilized for discharge and I doubt any other medical condition or other [facility] requiring further screening, evaluation, or treatment in the ED  at this time prior to discharge.  Plan: Home Medications-usual; Home Treatments-push oral fluids; return here if the recommended treatment, does not improve the symptoms; Recommended follow up-urology follow-up for ongoing management of hematuria.  Return here, PRN   Final Clinical Impressions(s) / ED Diagnoses   Final diagnoses:  None    ED Discharge Orders    None       Daleen Bo, MD 04/10/18 [phone]

## 2018-04-10 NOTE — Discharge Instructions (Signed)
Call your urology doctor at [facility], to schedule an appointment to be seen about the hematuria.  Watch for signs of infection including increased pain, fever, chills or vomiting.  Return here if needed.

## 2018-04-14 ENCOUNTER — Encounter: Payer: Self-pay | Admitting: Family Medicine

## 2018-04-15 DIAGNOSIS — N281 Cyst of kidney, acquired: Secondary | ICD-10-CM | POA: Diagnosis not present

## 2018-04-15 DIAGNOSIS — R351 Nocturia: Secondary | ICD-10-CM | POA: Diagnosis not present

## 2018-04-15 DIAGNOSIS — N401 Enlarged prostate with lower urinary tract symptoms: Secondary | ICD-10-CM | POA: Diagnosis not present

## 2018-04-15 DIAGNOSIS — R310 Gross hematuria: Secondary | ICD-10-CM | POA: Diagnosis not present

## 2018-04-17 ENCOUNTER — Other Ambulatory Visit (HOSPITAL_COMMUNITY): Payer: Self-pay | Admitting: Urology

## 2018-04-17 ENCOUNTER — Encounter: Payer: Self-pay | Admitting: Family Medicine

## 2018-04-17 ENCOUNTER — Other Ambulatory Visit: Payer: Self-pay | Admitting: Family Medicine

## 2018-04-17 DIAGNOSIS — R310 Gross hematuria: Secondary | ICD-10-CM

## 2018-04-17 DIAGNOSIS — R319 Hematuria, unspecified: Secondary | ICD-10-CM

## 2018-04-17 NOTE — Progress Notes (Signed)
[facility] u[facility]

## 2018-04-18 ENCOUNTER — Encounter: Payer: Self-pay | Admitting: Family Medicine

## 2018-04-21 ENCOUNTER — Ambulatory Visit (HOSPITAL_COMMUNITY)
Admission: RE | Admit: 2018-04-21 | Discharge: 2018-04-21 | Disposition: A | Discharge status: Home / Self Care | Payer: Medicare Other | Source: Ambulatory Visit | Attending: Urology | Admitting: Urology

## 2018-04-21 DIAGNOSIS — R310 Gross hematuria: Secondary | ICD-10-CM | POA: Diagnosis not present

## 2018-04-21 DIAGNOSIS — N281 Cyst of kidney, acquired: Secondary | ICD-10-CM | POA: Diagnosis not present

## 2018-04-21 LAB — POCT I-STAT CREATININE: Creatinine, Ser: 1.5 mg/dL — ABNORMAL HIGH (ref 0.61–1.24)

## 2018-04-21 MED ORDER — IOPAMIDOL (ISOVUE-300) INJECTION 61%
150.0000 mL | Freq: Once | INTRAVENOUS | Status: AC | PRN
  Administered 2018-04-21: 125 mL via INTRAVENOUS

## 2018-05-23 DIAGNOSIS — N400 Enlarged prostate without lower urinary tract symptoms: Secondary | ICD-10-CM | POA: Diagnosis not present

## 2018-05-23 DIAGNOSIS — R310 Gross hematuria: Secondary | ICD-10-CM | POA: Diagnosis not present

## 2018-07-31 ENCOUNTER — Ambulatory Visit: Payer: Medicare Other | Admitting: Family Medicine

## 2018-08-06 ENCOUNTER — Telehealth: Payer: Self-pay

## 2018-08-06 DIAGNOSIS — I10 Essential (primary) hypertension: Secondary | ICD-10-CM

## 2018-08-06 DIAGNOSIS — E785 Hyperlipidemia, unspecified: Secondary | ICD-10-CM

## 2018-08-06 NOTE — Telephone Encounter (Signed)
Lab order sent

## 2018-08-08 ENCOUNTER — Telehealth: Payer: Self-pay

## 2018-08-08 ENCOUNTER — Encounter: Payer: Self-pay | Admitting: Family Medicine

## 2018-08-08 DIAGNOSIS — I10 Essential (primary) hypertension: Secondary | ICD-10-CM | POA: Diagnosis not present

## 2018-08-08 DIAGNOSIS — E785 Hyperlipidemia, unspecified: Secondary | ICD-10-CM | POA: Diagnosis not present

## 2018-08-08 NOTE — Telephone Encounter (Signed)
Labs reordered to say stat

## 2018-08-09 LAB — BASIC METABOLIC PANEL WITH GFR
BUN/Creatinine Ratio: 11 (calc) (ref 6–22)
BUN: 15 mg/dL (ref 7–25)
CO2: 32 mmol/L (ref 20–32)
Calcium: 9 mg/dL (ref 8.6–10.3)
Chloride: 103 mmol/L (ref 98–110)
Creat: 1.39 mg/dL — ABNORMAL HIGH (ref 0.70–1.25)
GFR, Est African American: 60 mL/min/{1.73_m2} (ref >=60)
GFR, Est Non African American: 52 mL/min/{1.73_m2} — ABNORMAL LOW (ref >=60)
Glucose, Bld: 80 mg/dL (ref 65–99)
Potassium: 4.3 mmol/L (ref 3.5–5.3)
Sodium: 139 mmol/L (ref 135–146)

## 2018-08-09 LAB — LIPID PANEL
Cholesterol: 164 mg/dL (ref <200)
HDL: 51 mg/dL (ref >=40)
LDL Cholesterol (Calc): 96 mg/dL (calc)
Non-HDL Cholesterol (Calc): 113 mg/dL (calc) (ref <130)
Total CHOL/HDL Ratio: 3.2 (calc) (ref <5.0)
Triglycerides: 76 mg/dL (ref <150)

## 2018-08-13 ENCOUNTER — Ambulatory Visit (INDEPENDENT_AMBULATORY_CARE_PROVIDER_SITE_OTHER): Payer: Medicare Other | Admitting: Family Medicine

## 2018-08-13 ENCOUNTER — Encounter: Payer: Self-pay | Admitting: Family Medicine

## 2018-08-13 ENCOUNTER — Other Ambulatory Visit: Payer: Self-pay

## 2018-08-13 VITALS — BP 121/86 | HR 75 | Ht 73.0 in | Wt 181.0 lb

## 2018-08-13 DIAGNOSIS — E785 Hyperlipidemia, unspecified: Secondary | ICD-10-CM

## 2018-08-13 DIAGNOSIS — I10 Essential (primary) hypertension: Secondary | ICD-10-CM | POA: Diagnosis not present

## 2018-08-13 DIAGNOSIS — R310 Gross hematuria: Secondary | ICD-10-CM

## 2018-08-13 DIAGNOSIS — E559 Vitamin D deficiency, unspecified: Secondary | ICD-10-CM

## 2018-08-13 DIAGNOSIS — D72819 Decreased white blood cell count, unspecified: Secondary | ICD-10-CM | POA: Diagnosis not present

## 2018-08-13 DIAGNOSIS — R319 Hematuria, unspecified: Secondary | ICD-10-CM | POA: Insufficient documentation

## 2018-08-13 MED ORDER — AMLODIPINE BESYLATE 5 MG PO TABS: 5.0000 mg | ORAL_TABLET | Freq: Every day | ORAL | 3 refills | Status: DC

## 2018-08-13 NOTE — Assessment & Plan Note (Signed)
Controlled, no change in medication DASH diet and commitment to daily physical activity for a minimum of 30 minutes discussed and encouraged, as a part of hypertension management. The importance of attaining a healthy weight is also discussed.  BP/Weight 08/13/2018 04/10/2018 02/27/2018 01/30/2018 07/31/2017 06/27/2017 04/18/7251  Systolic BP 664 403 [phone] 643  Diastolic BP 86 97 80 82 82 78 76  Wt. (Lbs) 181 185 189 190 190 186 185.75  BMI 23.88 24.41 24.94 25.07 25.07 24.54 24.51

## 2018-08-13 NOTE — Progress Notes (Signed)
Virtual Visit via Video Note  I connected with Mark Sandoval on 08/13/18 at  8:20 AM EDT by a video enabled telemedicine application and verified that I am speaking with the correct person using two identifiers.   I discussed the limitations of evaluation and management by telemedicine and the availability of in person appointments. The patient expressed understanding and agreed to proceed. Patient is in his home and I am at my office Reviewed recent episode of spontaneous hematuria, had full evaluation  By Urology in [place] , which yielded The answer of possible blood vessel rupture, no stones seen . He is satisfied and continues to be diligent as far as prostate f/u because he has lost several close associates to prostate cancer Needs shingrix series   History of Present Illness: See HPI Denies recent fever or chills. Denies sinus pressure, nasal congestion, ear pain or sore throat.No current allergy flare Denies chest congestion, productive cough or wheezing. Denies chest pains, palpitations and leg swelling Denies abdominal pain, nausea, vomiting,diarrhea or constipation.   Denies dysuria, frequency, hesitancy or incontinence. Denies joint pain, swelling and limitation in mobility. Denies headaches, seizures, numbness, or tingling. Denies depression, anxiety or insomnia. Denies skin break down or rash.       Observations/Objective: BP 121/86   Pulse 75   Ht 6\' 1"  (1.854 m)   Wt 181 lb (82.1 kg)   BMI 23.88 kg/m  Good communication with no confusion and intact memory. Alert and oriented x 3 No signs of respiratory distress during sppech    Assessment and Plan: Essential hypertension Controlled, no change in medication DASH diet and commitment to daily physical activity for a minimum of 30 minutes discussed and encouraged, as a part of hypertension management. The importance of attaining a healthy weight is also discussed.  BP/Weight 08/13/2018 04/10/2018  02/27/2018 01/30/2018 07/31/2017 06/27/2017 7/84/6962  Systolic BP 952 841 324 401 027 253 664  Diastolic BP 86 97 80 82 82 78 76  Wt. (Lbs) 181 185 189 190 190 186 185.75  BMI 23.88 24.41 24.94 25.07 25.07 24.54 24.51       Leukocytopenia Followed by [facility], annually. Pt to call to see when his next appt is, will let me know if needs assistance with appt , has been stable, but wants to maintain annual contact   Dyslipidemia, goal LDL below 100 At goal with dietary change only, pt applauded on this   Hematuria Single episode, painful , occurred in 04/2018, no clear etiology identified despite complete Urology workup, continues to follow in [place] and [facility] re prostate, has enlarged prostate and concerns re prostate cancer    Follow Up Instructions:    I discussed the assessment and treatment plan with the patient. The patient was provided an opportunity to ask questions and all were answered. The patient agreed with the plan and demonstrated an understanding of the instructions.   The patient was advised to call back or seek an in-person evaluation if the symptoms worsen or if the condition fails to improve as anticipated.  I provided 25 minutes of non-face-to-face time during this encounter.   Tula Nakayama, MD

## 2018-08-13 NOTE — Addendum Note (Signed)
Addended by: Eual Fines on: 08/13/2018 01:10 PM   Modules accepted: Orders

## 2018-08-13 NOTE — Assessment & Plan Note (Signed)
Single episode, painful , occurred in 04/2018, no clear etiology identified despite complete Urology workup, continues to follow in [place] and [facility] re prostate, has enlarged prostate and concerns re prostate cancer

## 2018-08-13 NOTE — Patient Instructions (Addendum)
Please cancel May follow up, this has been done today  Labs to be drawn stat at the Hospital week of October 19, CBC, fasting lipid, TSH and vit D and cmp and EGFR  Please schedule 1 year f/u with MD, call if you need me before   Please keep Wellness appointment in the October  Excellent labs, and  Healthy weight achieved with dietary change in the past 1 year, congrats , keep it up please!  Medication for blood pressure is sent to your mail order  For the next 1 year, the nasal spray Astellin has not been sent as you report no need now. Please send a message letting me know where to and when you need/ want Astelin prescribed  Vaccination against Shingles is recommended with the " updated ' Shingrix vaccine , which is a 2 dose vaccine , given 2 to 6 , months apart. We will enclose general information about the vaccine and also send a note to your pharmacy , [facility] on [address], that the Pharmacist should administer the vaccine  It is important that you exercise regularly at least 30 minutes 5 times a week. If you develop chest pain, have severe difficulty breathing, or feel very tired, stop exercising immediately and seek medical attention    Social distancing. Frequent hand washing with soap and water Keeping your hands off of your face.Cover your face when outside of your home These 3 practices will help to keep both you and your community healthy during this time. Please practice them faithfully!  Thanks for choosing [facility], we consider it a privelige to serve you.

## 2018-08-13 NOTE — Assessment & Plan Note (Signed)
Followed by [facility], annually. Pt to call to see when his next appt is, will let me know if needs assistance with appt , has been stable, but wants to maintain annual contact

## 2018-08-13 NOTE — Assessment & Plan Note (Signed)
At goal with dietary change only, pt applauded on this

## 2018-08-28 ENCOUNTER — Ambulatory Visit: Payer: Medicare Other | Admitting: Family Medicine

## 2018-09-11 ENCOUNTER — Encounter: Payer: Self-pay | Admitting: *Deleted

## 2018-09-25 DIAGNOSIS — H2513 Age-related nuclear cataract, bilateral: Secondary | ICD-10-CM | POA: Diagnosis not present

## 2018-09-25 DIAGNOSIS — H43813 Vitreous degeneration, bilateral: Secondary | ICD-10-CM | POA: Diagnosis not present

## 2018-09-25 DIAGNOSIS — H5213 Myopia, bilateral: Secondary | ICD-10-CM | POA: Diagnosis not present

## 2018-10-07 ENCOUNTER — Telehealth: Payer: Self-pay | Admitting: *Deleted

## 2018-10-07 ENCOUNTER — Encounter: Payer: Self-pay | Admitting: Family Medicine

## 2018-10-07 ENCOUNTER — Other Ambulatory Visit: Payer: Medicare Other

## 2018-10-07 ENCOUNTER — Telehealth: Payer: Self-pay

## 2018-10-07 DIAGNOSIS — R6889 Other general symptoms and signs: Secondary | ICD-10-CM | POA: Diagnosis not present

## 2018-10-07 DIAGNOSIS — Z20822 Contact with and (suspected) exposure to covid-19: Secondary | ICD-10-CM

## 2018-10-07 DIAGNOSIS — U071 COVID-19: Secondary | ICD-10-CM

## 2018-10-07 NOTE — Telephone Encounter (Signed)
-----   Message from [place] sent at 10/07/2018  2:09 PM EDT ----- Regarding: covid 19 testing Needs covid 19 testing. Ordering Provider is Tula Nakayama, MD. Spouse is also being tested.   All the best, Abby W

## 2018-10-07 NOTE — Telephone Encounter (Signed)
Contacted patient, left voicemail regarding calling back to schedule COVID 19 test.  Test ordered.

## 2018-10-07 NOTE — Telephone Encounter (Signed)
Testing ordered

## 2018-10-07 NOTE — Telephone Encounter (Signed)
Note   --- This patient needs covid 19 testing please. Ordered by Dr.Margaret Moshe Cipro. Thank you    -Abby W  Pt scheduled for 1545 at [facility] site.  Pt with possible exposure, congestion.  Testing process reviewed, stay in car, wear mask. Verbalizes understanding.      PtS CB # [phone]

## 2018-10-09 LAB — NOVEL CORONAVIRUS, NAA: SARS-CoV-2, NAA: NOT DETECTED

## 2018-11-06 DIAGNOSIS — H43813 Vitreous degeneration, bilateral: Secondary | ICD-10-CM | POA: Diagnosis not present

## 2018-12-12 ENCOUNTER — Encounter: Payer: Self-pay | Admitting: Family Medicine

## 2018-12-15 ENCOUNTER — Ambulatory Visit (INDEPENDENT_AMBULATORY_CARE_PROVIDER_SITE_OTHER): Payer: Medicare Other

## 2018-12-15 ENCOUNTER — Other Ambulatory Visit: Payer: Self-pay

## 2018-12-15 DIAGNOSIS — Z23 Encounter for immunization: Secondary | ICD-10-CM | POA: Diagnosis not present

## 2018-12-15 DIAGNOSIS — Z20822 Contact with and (suspected) exposure to covid-19: Secondary | ICD-10-CM

## 2018-12-15 DIAGNOSIS — R6889 Other general symptoms and signs: Secondary | ICD-10-CM | POA: Diagnosis not present

## 2018-12-17 LAB — NOVEL CORONAVIRUS, NAA: SARS-CoV-2, NAA: NOT DETECTED

## 2019-01-20 DIAGNOSIS — M9903 Segmental and somatic dysfunction of lumbar region: Secondary | ICD-10-CM | POA: Diagnosis not present

## 2019-01-20 DIAGNOSIS — M545 Low back pain: Secondary | ICD-10-CM | POA: Diagnosis not present

## 2019-01-26 DIAGNOSIS — M9903 Segmental and somatic dysfunction of lumbar region: Secondary | ICD-10-CM | POA: Diagnosis not present

## 2019-01-26 DIAGNOSIS — M545 Low back pain: Secondary | ICD-10-CM | POA: Diagnosis not present

## 2019-01-29 DIAGNOSIS — L308 Other specified dermatitis: Secondary | ICD-10-CM | POA: Diagnosis not present

## 2019-01-29 DIAGNOSIS — L821 Other seborrheic keratosis: Secondary | ICD-10-CM | POA: Diagnosis not present

## 2019-02-02 ENCOUNTER — Ambulatory Visit: Payer: Medicare Other

## 2019-02-03 ENCOUNTER — Ambulatory Visit (INDEPENDENT_AMBULATORY_CARE_PROVIDER_SITE_OTHER): Payer: Medicare Other | Admitting: Family Medicine

## 2019-02-03 ENCOUNTER — Other Ambulatory Visit: Payer: Self-pay

## 2019-02-03 ENCOUNTER — Encounter: Payer: Self-pay | Admitting: Family Medicine

## 2019-02-03 VITALS — BP 121/86 | HR 75 | Resp 15 | Ht 73.0 in | Wt 181.0 lb

## 2019-02-03 DIAGNOSIS — Z0000 Encounter for general adult medical examination without abnormal findings: Secondary | ICD-10-CM | POA: Diagnosis not present

## 2019-02-03 NOTE — Patient Instructions (Signed)
Mark Sandoval , Thank you for taking time to come for your Medicare Wellness Visit. I appreciate your ongoing commitment to your health goals. Please review the following plan we discussed and let me know if I can assist you in the future.   Please continue to practice social distancing to keep you, your family, and our community safe.  If you must go out, please wear a Mask and practice good handwashing.  Screening recommendations/referrals: Colonoscopy: Due 2027 Recommended yearly ophthalmology/optometry visit for glaucoma screening and checkup Recommended yearly dental visit for hygiene and checkup  Vaccinations: Influenza vaccine: Completed. Due 2021 Pneumococcal vaccine: Completed Tdap vaccine: Due 2028 Shingles vaccine: Completed  Advanced directives: Completed  Conditions/risks identified: Falls, they are a risk for everyone.   Next appointment: 08/12/2019 @ 8am with Dr Moshe Cipro   Preventive Care 66 Years and Older, Male Preventive care refers to lifestyle choices and visits with your health care provider that can promote health and wellness. What does preventive care include?  A yearly physical exam. This is also called an annual well check.  Dental exams once or twice a year.  Routine eye exams. Ask your health care provider how often you should have your eyes checked.  Personal lifestyle choices, including:  Daily care of your teeth and gums.  Regular physical activity.  Eating a healthy diet.  Avoiding tobacco and drug use.  Limiting alcohol use.  Practicing safe sex.  Taking low doses of aspirin every day.  Taking vitamin and mineral supplements as recommended by your health care provider. What happens during an annual well check? The services and screenings done by your health care provider during your annual well check will depend on your age, overall health, lifestyle risk factors, and family history of disease. Counseling  Your health care provider  may ask you questions about your:  Alcohol use.  Tobacco use.  Drug use.  Emotional well-being.  Home and relationship well-being.  Sexual activity.  Eating habits.  History of falls.  Memory and ability to understand (cognition).  Work and work Statistician. Screening  You may have the following tests or measurements:  Height, weight, and BMI.  Blood pressure.  Lipid and cholesterol levels. These may be checked every 5 years, or more frequently if you are over 40 years old.  Skin check.  Lung cancer screening. You may have this screening every year starting at age 93 if you have a 30-pack-year history of smoking and currently smoke or have quit within the past 15 years.  Fecal occult blood test (FOBT) of the stool. You may have this test every year starting at age 31.  Flexible sigmoidoscopy or colonoscopy. You may have a sigmoidoscopy every 5 years or a colonoscopy every 10 years starting at age 23.  Prostate cancer screening. Recommendations will vary depending on your family history and other risks.  Hepatitis C blood test.  Hepatitis B blood test.  Sexually transmitted disease (STD) testing.  Diabetes screening. This is done by checking your blood sugar (glucose) after you have not eaten for a while (fasting). You may have this done every 1-3 years.  Abdominal aortic aneurysm (AAA) screening. You may need this if you are a current or former smoker.  Osteoporosis. You may be screened starting at age 45 if you are at high risk. Talk with your health care provider about your test results, treatment options, and if necessary, the need for more tests. Vaccines  Your health care provider may recommend certain vaccines, such  as:  Influenza vaccine. This is recommended every year.  Tetanus, diphtheria, and acellular pertussis (Tdap, Td) vaccine. You may need a Td booster every 10 years.  Zoster vaccine. You may need this after age 27.  Pneumococcal 13-valent  conjugate (PCV13) vaccine. One dose is recommended after age 101.  Pneumococcal polysaccharide (PPSV23) vaccine. One dose is recommended after age 1. Talk to your health care provider about which screenings and vaccines you need and how often you need them. This information is not intended to replace advice given to you by your health care provider. Make sure you discuss any questions you have with your health care provider. Document Released: 04/29/2015 Document Revised: 12/21/2015 Document Reviewed: 02/01/2015 Elsevier Interactive Patient Education  2017 [place] Prevention in the Home Falls can cause injuries. They can happen to people of all ages. There are many things you can do to make your home safe and to help prevent falls. What can I do on the outside of my home?  Regularly fix the edges of walkways and driveways and fix any cracks.  Remove anything that might make you trip as you walk through a door, such as a raised step or threshold.  Trim any bushes or trees on the path to your home.  Use bright outdoor lighting.  Clear any walking paths of anything that might make someone trip, such as rocks or tools.  Regularly check to see if handrails are loose or broken. Make sure that both sides of any steps have handrails.  Any raised decks and porches should have guardrails on the edges.  Have any leaves, snow, or ice cleared regularly.  Use sand or salt on walking paths during winter.  Clean up any spills in your garage right away. This includes oil or grease spills. What can I do in the bathroom?  Use night lights.  Install grab bars by the toilet and in the tub and shower. Do not use towel bars as grab bars.  Use non-skid mats or decals in the tub or shower.  If you need to sit down in the shower, use a plastic, non-slip stool.  Keep the floor dry. Clean up any water that spills on the floor as soon as it happens.  Remove soap buildup in the tub or  shower regularly.  Attach bath mats securely with double-sided non-slip rug tape.  Do not have throw rugs and other things on the floor that can make you trip. What can I do in the bedroom?  Use night lights.  Make sure that you have a light by your bed that is easy to reach.  Do not use any sheets or blankets that are too big for your bed. They should not hang down onto the floor.  Have a firm chair that has side arms. You can use this for support while you get dressed.  Do not have throw rugs and other things on the floor that can make you trip. What can I do in the kitchen?  Clean up any spills right away.  Avoid walking on wet floors.  Keep items that you use a lot in easy-to-reach places.  If you need to reach something above you, use a strong step stool that has a grab bar.  Keep electrical cords out of the way.  Do not use floor polish or wax that makes floors slippery. If you must use wax, use non-skid floor wax.  Do not have throw rugs and other things on the  floor that can make you trip. What can I do with my stairs?  Do not leave any items on the stairs.  Make sure that there are handrails on both sides of the stairs and use them. Fix handrails that are broken or loose. Make sure that handrails are as long as the stairways.  Check any carpeting to make sure that it is firmly attached to the stairs. Fix any carpet that is loose or worn.  Avoid having throw rugs at the top or bottom of the stairs. If you do have throw rugs, attach them to the floor with carpet tape.  Make sure that you have a light switch at the top of the stairs and the bottom of the stairs. If you do not have them, ask someone to add them for you. What else can I do to help prevent falls?  Wear shoes that:  Do not have high heels.  Have rubber bottoms.  Are comfortable and fit you well.  Are closed at the toe. Do not wear sandals.  If you use a stepladder:  Make sure that it is fully  opened. Do not climb a closed stepladder.  Make sure that both sides of the stepladder are locked into place.  Ask someone to hold it for you, if possible.  Clearly mark and make sure that you can see:  Any grab bars or handrails.  First and last steps.  Where the edge of each step is.  Use tools that help you move around (mobility aids) if they are needed. These include:  Canes.  Walkers.  Scooters.  Crutches.  Turn on the lights when you go into a dark area. Replace any light bulbs as soon as they burn out.  Set up your furniture so you have a clear path. Avoid moving your furniture around.  If any of your floors are uneven, fix them.  If there are any pets around you, be aware of where they are.  Review your medicines with your doctor. Some medicines can make you feel dizzy. This can increase your chance of falling. Ask your doctor what other things that you can do to help prevent falls. This information is not intended to replace advice given to you by your health care provider. Make sure you discuss any questions you have with your health care provider. Document Released: 01/27/2009 Document Revised: 09/08/2015 Document Reviewed: 05/07/2014 Elsevier Interactive Patient Education  2017 [employer].

## 2019-02-03 NOTE — Progress Notes (Signed)
Subjective:   Mark Sandoval is a 68 y.o. male who presents for Medicare Annual/Subsequent preventive examination.  Location of Patient: Home Location of Provider: Telehealth Consent was obtain for visit to be over via telehealth.  I verified that I am speaking with the correct person using two identifiers.   Review of Systems:    Cardiac Risk Factors include: advanced age (>18men, >63 women);dyslipidemia;hypertension;male gender     Objective:    Vitals: BP 121/86   Pulse 75   Resp 15   Ht 6\' 1"  (1.854 m)   Wt 181 lb (82.1 kg)   BMI 23.88 kg/m   Body mass index is 23.88 kg/m.  Advanced Directives 04/10/2018 01/30/2018 05/25/2016 05/18/2016 05/10/2016 03/29/2016  Does Patient Have a Medical Advance Directive? Yes Yes Yes Yes Yes Yes  Type of Paramedic of [place];Living will - (No Data) (No Data) (No Data) [place];Living will  Does patient want to make changes to medical advance directive? No - Patient declined - - - No - Patient declined -  Copy of [place] in Chart? No - copy requested - - - - No - copy requested    Tobacco Social History   Tobacco Use  Smoking Status Never Smoker  Smokeless Tobacco Never Used     Counseling given: Yes   Clinical Intake:  Pre-visit preparation completed: Yes  Pain : No/denies pain Pain Score: 0-No pain     BMI - recorded: 23.88 Nutritional Status: BMI of 19-24  Normal Nutritional Risks: None Diabetes: No  How often do you need to have someone help you when you read instructions, pamphlets, or other written materials from your doctor or pharmacy?: 1 - Never What is the last grade level you completed in school?: Professional School  Interpreter Needed?: No     Past Medical History:  Diagnosis Date  . Dyslipidemia   . GERD (gastroesophageal reflux disease)   . History of seasonal allergies   . Hypertension   . Leukopenia   . Lump or mass in  breast    benign  . Thrombocytopenia ([place])    Past Surgical History:  Procedure Laterality Date  . athroscopy of left knee  2002  . CHOLECYSTECTOMY  2007  . COLONOSCOPY N/A 03/29/2016   Procedure: COLONOSCOPY;  Surgeon: Rogene Houston, MD;  Location: [facility];  Service: Endoscopy;  Laterality: N/A;  1030  . removal of benign left breast lump  09/06/07   Family History  Problem Relation Age of Onset  . Cancer Mother        hematologic cancer   . Hypertension Mother   . Alcohol abuse Father    Social History   Socioeconomic History  . Marital status: Married    Spouse name: Not on file  . Number of children: 4  . Years of education: DDS  . Highest education level: Not on file  Occupational History  . Occupation: Orthoptist: SELF-EMPLOYED    Comment: retired   Scientific laboratory technician  . Financial resource strain: Not hard at all  . Food insecurity    Worry: Never true    Inability: Never true  . Transportation needs    Medical: No    Non-medical: No  Tobacco Use  . Smoking status: Never Smoker  . Smokeless tobacco: Never Used  Substance and Sexual Activity  . Alcohol use: Yes    Alcohol/week: 0.0 standard drinks    Comment:  occasional  . Drug use: No  . Sexual activity: Yes  Lifestyle  . Physical activity    Days per week: 0 days    Minutes per session: 0 min  . Stress: Not at all  Relationships  . Social Herbalist on phone: Three times a week    Gets together: Three times a week    Attends religious service: More than 4 times per year    Active member of club or organization: Yes    Attends meetings of clubs or organizations: More than 4 times per year    Relationship status: Married  Other Topics Concern  . Not on file  Social History Narrative  . Not on file    Outpatient Encounter Medications as of 02/03/2019  Medication Sig  . amLODipine (NORVASC) 5 MG tablet Take 1 tablet (5 mg total) by mouth daily.  [name] azelastine (ASTELIN) 0.1 %  nasal spray Place 2 sprays into both nostrils 2 (two) times daily. Use in each nostril as directed  . Multiple Vitamin (MULTIVITAMIN WITH MINERALS) TABS tablet Take 1 tablet by mouth daily.  . [DISCONTINUED] UNABLE TO FIND Shringrix Vaccine (Patient not taking: Reported on 02/03/2019)   No facility-administered encounter medications on file as of 02/03/2019.     Activities of Daily Living In your present state of health, do you have any difficulty performing the following activities: 02/03/2019  Hearing? N  Vision? N  Difficulty concentrating or making decisions? N  Walking or climbing stairs? N  Dressing or bathing? N  Doing errands, shopping? N  Preparing Food and eating ? N  Using the Toilet? N  In the past six months, have you accidently leaked urine? N  Do you have problems with loss of bowel control? N  Managing your Medications? N  Managing your Finances? N  Housekeeping or managing your Housekeeping? N  Some recent data might be hidden    Patient Care Team: Fayrene Helper, MD as PCP - General   Assessment:   This is a routine wellness examination for Mark Sandoval.  Exercise Activities and Dietary recommendations Current Exercise Habits: Home exercise routine, Type of exercise: walking, Time (Minutes): 45, Frequency (Times/Week): 4, Weekly Exercise (Minutes/Week): 180, Intensity: Moderate, Exercise limited by: None identified  Goals    . Follow up with Primary Care Provider     Increase yearly visits to bi-yearly per pt     . Increase physical activity (pt-stated)     Retired and currently gets very little physical activity. Discussed a goal of walking for 20 minutes for 3 days a week to start    . Prevent falls     Needs to practice chair exercises to strengthen legs and keep them strong and reduce likelyhood of falls        Fall Risk Fall Risk  02/03/2019 08/13/2018 02/27/2018 01/30/2018 01/01/2017  Falls in the past year? 0 0 0 No No  Number falls in past  yr: 0 0 - - -  Injury with Fall? 0 0 - - -   Is the patient's home free of loose throw rugs in walkways, pet beds, electrical cords, etc?   yes      Grab bars in the bathroom? no      Handrails on the stairs?   yes      Adequate lighting?   yes     Depression Screen PHQ 2/9 Scores 02/03/2019 02/27/2018 01/30/2018 07/31/2017  PHQ - 2 Score 0 0 0  0  PHQ- 9 Score - - - -    Cognitive Function     6CIT Screen 02/03/2019 01/30/2018  What Year? 0 points 0 points  What month? 0 points 0 points  What time? 0 points 0 points  Count back from 20 0 points 0 points  Months in reverse 0 points 2 points  Repeat phrase 0 points 0 points  Total Score 0 2    Immunization History  Administered Date(s) Administered  . Fluad Quad(high Dose 65+) 12/15/2018  . H1N1 02/06/2008  . Influenza Split 02/02/2011, 02/15/2012  . Influenza Whole 01/17/2007, 01/13/2010  . Influenza, High Dose Seasonal PF 01/10/2018  . Influenza,inj,Quad PF,6+ Mos 01/15/2014, 02/02/2015, 01/05/2016, 01/01/2017  . Pneumococcal Conjugate-13 01/26/2016  . Pneumococcal Polysaccharide-23 07/31/2017  . Td 05/03/2006  . Tdap 01/01/2017  . Zoster 02/02/2011    Qualifies for Shingles Vaccine?  completed  Screening Tests Health Maintenance  Topic Date Due  . COLONOSCOPY  03/29/2026  . TETANUS/TDAP  01/02/2027  . INFLUENZA VACCINE  Completed  . Hepatitis C Screening  Completed  . PNA vac Low Risk Adult  Completed   Cancer Screenings: Lung: Low Dose CT Chest recommended if Age 64-80 years, 30 pack-year currently smoking OR have quit w/in 15years. Patient does not qualify. Colorectal: Due 2027  Additional Screenings:  Hepatitis C Screening:      Plan:     1. Encounter for Medicare annual wellness exam  I have personally reviewed and noted the following in the patient's chart:   . Medical and social history . Use of alcohol, tobacco or illicit drugs  . Current medications and supplements . Functional ability  and status . Nutritional status . Physical activity . Advanced directives . List of other physicians . Hospitalizations, surgeries, and ER visits in previous 12 months . Vitals . Screenings to include cognitive, depression, and falls . Referrals and appointments  In addition, I have reviewed and discussed with patient certain preventive protocols, quality metrics, and best practice recommendations. A written personalized care plan for preventive services as well as general preventive health recommendations were provided to patient.     I provided 20 minutes of non-face-to-face time during this encounter.  Perlie Mayo, NP  02/03/2019

## 2019-02-27 DIAGNOSIS — M47812 Spondylosis without myelopathy or radiculopathy, cervical region: Secondary | ICD-10-CM | POA: Diagnosis not present

## 2019-02-27 DIAGNOSIS — Z9049 Acquired absence of other specified parts of digestive tract: Secondary | ICD-10-CM | POA: Diagnosis not present

## 2019-02-27 DIAGNOSIS — M47816 Spondylosis without myelopathy or radiculopathy, lumbar region: Secondary | ICD-10-CM | POA: Diagnosis not present

## 2019-02-27 DIAGNOSIS — R2689 Other abnormalities of gait and mobility: Secondary | ICD-10-CM | POA: Diagnosis not present

## 2019-02-27 DIAGNOSIS — M545 Low back pain: Secondary | ICD-10-CM | POA: Diagnosis not present

## 2019-02-27 DIAGNOSIS — R202 Paresthesia of skin: Secondary | ICD-10-CM | POA: Diagnosis not present

## 2019-02-27 DIAGNOSIS — G8929 Other chronic pain: Secondary | ICD-10-CM | POA: Diagnosis not present

## 2019-02-27 DIAGNOSIS — M47814 Spondylosis without myelopathy or radiculopathy, thoracic region: Secondary | ICD-10-CM | POA: Diagnosis not present

## 2019-02-27 DIAGNOSIS — M4807 Spinal stenosis, lumbosacral region: Secondary | ICD-10-CM | POA: Diagnosis not present

## 2019-02-27 DIAGNOSIS — M40204 Unspecified kyphosis, thoracic region: Secondary | ICD-10-CM | POA: Diagnosis not present

## 2019-03-06 ENCOUNTER — Ambulatory Visit: Payer: Medicare Other

## 2019-03-06 ENCOUNTER — Other Ambulatory Visit: Payer: Self-pay

## 2019-03-06 ENCOUNTER — Ambulatory Visit (INDEPENDENT_AMBULATORY_CARE_PROVIDER_SITE_OTHER): Payer: Medicare Other | Admitting: Orthopedic Surgery

## 2019-03-06 VITALS — BP 128/90 | HR 80 | Temp 97.0°F | Ht 73.0 in | Wt 190.0 lb

## 2019-03-06 DIAGNOSIS — M79641 Pain in right hand: Secondary | ICD-10-CM | POA: Diagnosis not present

## 2019-03-06 DIAGNOSIS — M25561 Pain in right knee: Secondary | ICD-10-CM

## 2019-03-06 DIAGNOSIS — M24131 Other articular cartilage disorders, right wrist: Secondary | ICD-10-CM | POA: Diagnosis not present

## 2019-03-06 DIAGNOSIS — M7051 Other bursitis of knee, right knee: Secondary | ICD-10-CM | POA: Diagnosis not present

## 2019-03-06 DIAGNOSIS — M79642 Pain in left hand: Secondary | ICD-10-CM | POA: Diagnosis not present

## 2019-03-06 DIAGNOSIS — M170 Bilateral primary osteoarthritis of knee: Secondary | ICD-10-CM

## 2019-03-06 NOTE — Progress Notes (Signed)
Mark Sandoval  03/06/2019  Body mass index is 25.07 kg/m.   HISTORY SECTION :  Chief Complaint  Patient presents with  . Hand Pain    Bilteral hand pain.  . Knee Pain    Right knee pain.   HPI The patient presents for evaluation of  (mild/moderate/severe/ ) mild pain, in the (right /left) right knee medial side and medial joint including Pez bursa, for several months, associated with some discomfort with golfing  He also complains of pain in his right wrist at the TFCC area and swelling of the ulnar styloid with other complaints of discomfort in his hands and joints of the hand   ROS   #1 chronic turf toe with intermittent pain usually relieved by CAM Walker  #2 no numbness or tingling in the hands  #3 no mechanical symptoms of catching locking in the knees   has a past medical history of Dyslipidemia, GERD (gastroesophageal reflux disease), History of seasonal allergies, Hypertension, Leukopenia, Lump or mass in breast, and Thrombocytopenia ([place]).   Past Surgical History:  Procedure Laterality Date  . athroscopy of left knee  2002  . CHOLECYSTECTOMY  2007  . COLONOSCOPY N/A 03/29/2016   Procedure: COLONOSCOPY;  Surgeon: Rogene Houston, MD;  [facility];  Service: Endoscopy;  Laterality: N/A;  1030  . removal of benign left breast lump  09/06/07    Body mass index is 25.07 kg/m.   Allergies  Allergen Reactions  . Compazine Other (See Comments)    tongue movement disorders/MD in ER stated he would have expired  . Daucus Carota Other (See Comments)    Mouth swollen     Current Outpatient Medications:  .  amLODipine (NORVASC) 5 MG tablet, Take 1 tablet (5 mg total) by mouth daily., Disp: 90 tablet, Rfl: 3 .  Multiple Vitamin (MULTIVITAMIN WITH MINERALS) TABS tablet, Take 1 tablet by mouth daily., Disp: , Rfl:  .  azelastine (ASTELIN) 0.1 % nasal spray, Place 2 sprays into both nostrils 2 (two) times daily. Use in each nostril as directed, Disp:  30 mL, Rfl: 12   PHYSICAL EXAM SECTION: 1) BP 128/90   Pulse 80   Temp (!) 97 F (36.1 C)   Ht 6\' 1"  (1.854 m)   Wt 190 lb (86.2 kg)   BMI 25.07 kg/m   Body mass index is 25.07 kg/m. General appearance: Well-developed well-nourished no gross deformities  2) Cardiovascular normal pulse and perfusion in the lower and upper extremities normal color without edema  3) Neurologically deep tendon reflexes are equal and normal, no sensation loss or deficits no pathologic reflexes  4) Psychological: Awake alert and oriented x3 mood and affect normal  5) Skin no lacerations or ulcerations no nodularity no palpable masses, no erythema or nodularity  6) Musculoskeletal:   Right knee swelling over the Pez bursa with tenderness mild tenderness posterior medial joint line no effusion full range of motion normal muscle tone and strength ligaments stable  Left knee normal range of motion stability and strength skin is normal there is no tenderness  Left hand he has some tenderness in the MP joints primarily the ring and long finger and then some swelling in the MP joint of the ring finger  Right hand relatively normal with no tenderness but right wrist tenderness and a click noted in the TFCC area with swelling over the ulnar styloid   MEDICAL DECISION SECTION:  Encounter Diagnoses  Name Primary?  . Pain in left  hand Yes  . Pain in right hand   . Right knee pain, unspecified chronicity   . Degenerative tear of triangular fibrocartilage complex (TFCC) of right wrist   . Pes anserinus bursitis of right knee   . Primary osteoarthritis of both knees     Imaging X-rays of his hands did not show any appreciable disease  Both knees show mild narrowing without spur or secondary bone changes  Plan:  (Rx., Inj., surg., Frx, MRI/CT, XR:2) Recommend splinting the right wrist for possible TFCC chronic degenerative tear  Local measures for the knee and hands  Follow-up as needed   12:17  PM Arther Abbott, MD  03/06/2019

## 2019-03-10 ENCOUNTER — Other Ambulatory Visit: Payer: Self-pay

## 2019-03-10 DIAGNOSIS — Z20822 Contact with and (suspected) exposure to covid-19: Secondary | ICD-10-CM

## 2019-03-10 DIAGNOSIS — Z20828 Contact with and (suspected) exposure to other viral communicable diseases: Secondary | ICD-10-CM | POA: Diagnosis not present

## 2019-03-11 LAB — NOVEL CORONAVIRUS, NAA: SARS-CoV-2, NAA: NOT DETECTED

## 2019-03-25 DIAGNOSIS — M5136 Other intervertebral disc degeneration, lumbar region: Secondary | ICD-10-CM | POA: Diagnosis not present

## 2019-03-25 DIAGNOSIS — M545 Low back pain: Secondary | ICD-10-CM | POA: Diagnosis not present

## 2019-03-25 DIAGNOSIS — G8929 Other chronic pain: Secondary | ICD-10-CM | POA: Diagnosis not present

## 2019-03-25 DIAGNOSIS — M5126 Other intervertebral disc displacement, lumbar region: Secondary | ICD-10-CM | POA: Diagnosis not present

## 2019-03-25 DIAGNOSIS — M4807 Spinal stenosis, lumbosacral region: Secondary | ICD-10-CM | POA: Diagnosis not present

## 2019-03-26 DIAGNOSIS — D61818 Other pancytopenia: Secondary | ICD-10-CM | POA: Diagnosis not present

## 2019-03-26 DIAGNOSIS — D696 Thrombocytopenia, unspecified: Secondary | ICD-10-CM | POA: Diagnosis not present

## 2019-03-26 DIAGNOSIS — D708 Other neutropenia: Secondary | ICD-10-CM | POA: Diagnosis not present

## 2019-03-30 DIAGNOSIS — R202 Paresthesia of skin: Secondary | ICD-10-CM | POA: Diagnosis not present

## 2019-04-21 ENCOUNTER — Encounter: Payer: Self-pay | Admitting: Family Medicine

## 2019-05-12 ENCOUNTER — Ambulatory Visit: Payer: Medicare Other

## 2019-05-19 ENCOUNTER — Ambulatory Visit: Payer: Medicare Other | Attending: Internal Medicine

## 2019-05-19 ENCOUNTER — Other Ambulatory Visit: Payer: Self-pay

## 2019-05-19 DIAGNOSIS — Z20822 Contact with and (suspected) exposure to covid-19: Secondary | ICD-10-CM

## 2019-05-20 LAB — NOVEL CORONAVIRUS, NAA: SARS-CoV-2, NAA: NOT DETECTED

## 2019-05-21 ENCOUNTER — Ambulatory Visit: Payer: Medicare Other | Attending: Internal Medicine

## 2019-05-21 DIAGNOSIS — Z23 Encounter for immunization: Secondary | ICD-10-CM | POA: Insufficient documentation

## 2019-05-21 NOTE — Progress Notes (Signed)
   Covid-19 Vaccination Clinic  Name:  Mark Sandoval    MRN: [id number] DOB: 04/10/1951  05/21/2019  Mark Sandoval was observed post Covid-19 immunization for 15 minutes without incidence. He was provided with Vaccine Information Sheet and instruction to access the V-Safe system.   Mark Sandoval was instructed to call 911 with any severe reactions post vaccine: [name] Difficulty breathing  . Swelling of your face and throat  . A fast heartbeat  . A bad rash all over your body  . Dizziness and weakness    Immunizations Administered    Name Date Dose VIS Date Route   Pfizer COVID-19 Vaccine 05/21/2019  8:18 AM 0.3 mL 03/27/2019 Intramuscular   Manufacturer: [place]   Lot: [id number]   [place]: [id number]

## 2019-05-25 DIAGNOSIS — N401 Enlarged prostate with lower urinary tract symptoms: Secondary | ICD-10-CM | POA: Diagnosis not present

## 2019-05-25 DIAGNOSIS — R3912 Poor urinary stream: Secondary | ICD-10-CM | POA: Diagnosis not present

## 2019-05-25 DIAGNOSIS — R350 Frequency of micturition: Secondary | ICD-10-CM | POA: Diagnosis not present

## 2019-06-15 ENCOUNTER — Ambulatory Visit: Payer: Medicare Other | Attending: Internal Medicine

## 2019-06-15 DIAGNOSIS — Z23 Encounter for immunization: Secondary | ICD-10-CM | POA: Insufficient documentation

## 2019-06-15 NOTE — Progress Notes (Signed)
   Covid-19 Vaccination Clinic  Name:  Mark Sandoval    MRN: [id number] DOB: [birth year]  06/15/2019  Mark Sandoval was observed post Covid-19 immunization for 15 minutes without incidence. He was provided with Vaccine Information Sheet and instruction to access the V-Safe system.   Mark Sandoval was instructed to call 911 with any severe reactions post vaccine: [name] Difficulty breathing  . Swelling of your face and throat  . A fast heartbeat  . A bad rash all over your body  . Dizziness and weakness    Immunizations Administered    Name Date Dose VIS Date Route   Pfizer COVID-19 Vaccine 06/15/2019 12:43 PM 0.3 mL 03/27/2019 Intramuscular   Manufacturer: [place]   Lot: [id number]   [place]: [id number]

## 2019-06-30 ENCOUNTER — Encounter: Payer: Self-pay | Admitting: Family Medicine

## 2019-07-02 ENCOUNTER — Ambulatory Visit (INDEPENDENT_AMBULATORY_CARE_PROVIDER_SITE_OTHER): Payer: Medicare Other | Admitting: Family Medicine

## 2019-07-02 ENCOUNTER — Other Ambulatory Visit: Payer: Self-pay

## 2019-07-02 ENCOUNTER — Encounter: Payer: Self-pay | Admitting: Family Medicine

## 2019-07-02 VITALS — BP 122/82 | HR 87 | Temp 98.2°F | Ht 73.0 in | Wt 190.6 lb

## 2019-07-02 DIAGNOSIS — W57XXXA Bitten or stung by nonvenomous insect and other nonvenomous arthropods, initial encounter: Secondary | ICD-10-CM

## 2019-07-02 DIAGNOSIS — S30861A Insect bite (nonvenomous) of abdominal wall, initial encounter: Secondary | ICD-10-CM

## 2019-07-02 MED ORDER — DOXYCYCLINE HYCLATE 100 MG PO TABS: 100.0000 mg | ORAL_TABLET | Freq: Two times a day (BID) | ORAL | 0 refills | Status: DC

## 2019-07-02 NOTE — Addendum Note (Signed)
Addended by: Norton Blizzard R on: [id number] 11:05 AM   Modules accepted: Orders

## 2019-07-02 NOTE — Patient Instructions (Addendum)
Doxy-prescription on  Blood work   If you have lab work done today you will be contacted with your lab results within the next 2 weeks.  If you have not heard from [place] then please contact us. The fastest way to get your results is to register for My Chart.   IF you received an x-ray today, you will receive an invoice from [facility]. Please contact [facility] at [phone] with questions or concerns regarding your invoice.   IF you received labwork today, you will receive an invoice from [facility]. Please contact [facility] at [phone] with questions or concerns regarding your invoice.   Our billing staff will not be able to assist you with questions regarding bills from these companies.  You will be contacted with the lab results as soon as they are available. The fastest way to get your results is to activate your My Chart account. Instructions are located on the last page of this paperwork. If you have not heard from [place] regarding the results in 2 weeks, please contact this office.

## 2019-07-02 NOTE — Progress Notes (Signed)
Acute Office Visit  Subjective:    Patient ID: Mark Sandoval, male    DOB: Apr 26, 1950, 69 y.o.   MRN: [id number]  Chief Complaint  Patient presents with  . Insect Bite    tick bite on the right truck area. Pull off tick 4 days ago now red, itchy and painful.    HPI Patient is in today for tick bite-concerned about red ring associated with tic attachment. Pt states last Thursday when noted shiny area when showering. Scrapped area thinking it was a scab and noted legs. Pt states he is unsure if all parts removed. Concerned about Lyme disease. Pt states pain at site of tic attachment today. No allergies to topicals. Verbal agreement to open area.  Past Medical History:  Diagnosis Date  . Dyslipidemia   . GERD (gastroesophageal reflux disease)   . History of seasonal allergies   . Hypertension   . Leukopenia   . Lump or mass in breast    benign  . Thrombocytopenia ([place])     Past Surgical History:  Procedure Laterality Date  . athroscopy of left knee  2002  . CHOLECYSTECTOMY  2007  . COLONOSCOPY N/A 03/29/2016   Procedure: COLONOSCOPY;  Surgeon: Rogene Houston, MD;  [facility];  Service: Endoscopy;  Laterality: N/A;  1030  . removal of benign left breast lump  09/06/07    Family History  Problem Relation Age of Onset  . Cancer Mother        hematologic cancer   . Hypertension Mother   . Alcohol abuse Father     Social History   Socioeconomic History  . Marital status: Married    Spouse name: Not on file  . Number of children: 4  . Years of education: DDS  . Highest education level: Not on file  Occupational History  . Occupation: Orthoptist: SELF-EMPLOYED    Comment: retired   Tobacco Use  . Smoking status: Never Smoker  . Smokeless tobacco: Never Used  Substance and Sexual Activity  . Alcohol use: Yes    Alcohol/week: 0.0 standard drinks    Comment: occasional  . Drug use: No  . Sexual activity: Yes  Other Topics Concern  .  Not on file  Social History Narrative  . Not on file   Social Determinants of Health   Financial Resource Strain:   . Difficulty of Paying Living Expenses:   Food Insecurity:   . Worried About Charity fundraiser in the Last Year:   . Arboriculturist in the Last Year:   Transportation Needs:   . Film/video editor (Medical):   [name] Lack of Transportation (Non-Medical):   Physical Activity:   . Days of Exercise per Week:   . Minutes of Exercise per Session:   Stress:   . Feeling of Stress :   Social Connections:   . Frequency of Communication with Friends and Family:   . Frequency of Social Gatherings with Friends and Family:   . Attends Religious Services:   . Active Member of Clubs or Organizations:   . Attends Archivist Meetings:   [name] Marital Status:   Intimate Partner Violence:   . Fear of Current or Ex-Partner:   . Emotionally Abused:   [name] Physically Abused:   . Sexually Abused:     Outpatient Medications Prior to Visit  Medication Sig Dispense Refill  . amLODipine (NORVASC) 5 MG tablet Take 1 tablet (  5 mg total) by mouth daily. 90 tablet 3  . Multiple Vitamin (MULTIVITAMIN WITH MINERALS) TABS tablet Take 1 tablet by mouth daily.    [name] azelastine (ASTELIN) 0.1 % nasal spray Place 2 sprays into both nostrils 2 (two) times daily. Use in each nostril as directed 30 mL 12   No facility-administered medications prior to visit.    Allergies  Allergen Reactions  . Compazine Other (See Comments)    tongue movement disorders/MD in ER stated he would have expired  . Daucus Carota Other (See Comments)    Mouth swollen    Review of Systems  Constitutional: Negative for fatigue and fever.  Musculoskeletal: Negative for arthralgias and myalgias.  Skin: Positive for color change and rash.       Red at site of tic bite       Objective:    Physical Exam Constitutional:      Appearance: Normal appearance.  Skin:    Findings: Erythema present.     Comments:  eryth noted with foreign body noted at site. Likely retained part of tic. Lidocaine without epi injected into site. Using sterile forceps removed small foreign body. Flushed with hydrogen peroxide. Tegaderm placed.    Neurological:     Mental Status: He is alert.     BP 122/82 (BP Location: Right Arm, Patient Position: Sitting, Cuff Size: Large)   Pulse 87   Temp 98.2 F (36.8 C) (Temporal)   Ht 6\' 1"  (1.854 m)   Wt 190 lb 9.6 oz (86.5 kg)   SpO2 97%   BMI 25.15 kg/m  Wt Readings from Last 3 Encounters:  07/02/19 190 lb 9.6 oz (86.5 kg)  03/06/19 190 lb (86.2 kg)  02/03/19 181 lb (82.1 kg)    Lab Results  Component Value Date   TSH 1.85 12/25/2016   Lab Results  Component Value Date   WBC 3.8 02/24/2018   HGB 13.7 02/24/2018   HCT 42.6 02/24/2018   MCV 70.2 (L) 02/24/2018   PLT 153 02/24/2018   Lab Results  Component Value Date   NA 139 08/08/2018   K 4.3 08/08/2018   CO2 32 08/08/2018   GLUCOSE 80 08/08/2018   BUN 15 08/08/2018   CREATININE 1.39 (H) 08/08/2018   BILITOT 0.7 02/24/2018   ALKPHOS 98 06/27/2017   AST 22 02/24/2018   ALT 23 02/24/2018   PROT 6.6 02/24/2018   ALBUMIN 4.0 06/27/2017   CALCIUM 9.0 08/08/2018   ANIONGAP 10 07/03/2017   Lab Results  Component Value Date   CHOL 164 08/08/2018   Lab Results  Component Value Date   HDL 51 08/08/2018   Lab Results  Component Value Date   LDLCALC 96 08/08/2018   Lab Results  Component Value Date   TRIG 76 08/08/2018   Lab Results  Component Value Date   CHOLHDL 3.2 08/08/2018   Lab Results  Component Value Date   HGBA1C 5.6 08/23/2015       Assessment & Plan:   1. Tick bite of abdomen, initial encounter Removed small foreign body-likely retained part of tic from site-flushed- Doxy-rx - Lyme Ab/Western Blot Reflex - Rocky mtn spotted fvr abs pnl(IgG+IgM) LISA Hannah Beat, MD

## 2019-07-06 LAB — ROCKY MTN SPOTTED FVR ABS PNL(IGG+IGM)
RMSF IgG: NOT DETECTED
RMSF IgM: NOT DETECTED

## 2019-07-06 LAB — B. BURGDORFI ANTIBODIES: B burgdorferi Ab IgG+IgM: <0.9 index

## 2019-07-07 ENCOUNTER — Telehealth: Payer: Self-pay | Admitting: Emergency Medicine

## 2019-07-07 NOTE — Telephone Encounter (Signed)
-----   Message from Maryruth Hancock, MD sent at 07/06/2019 12:51 PM EDT ----- No lyme disease or Rocky MT disease

## 2019-07-07 NOTE — Telephone Encounter (Signed)
Left a msg on machine to return call ref lab results

## 2019-07-27 ENCOUNTER — Encounter: Payer: Self-pay | Admitting: Family Medicine

## 2019-07-28 DIAGNOSIS — Z87448 Personal history of other diseases of urinary system: Secondary | ICD-10-CM | POA: Diagnosis not present

## 2019-07-28 DIAGNOSIS — R3912 Poor urinary stream: Secondary | ICD-10-CM | POA: Diagnosis not present

## 2019-07-28 DIAGNOSIS — N281 Cyst of kidney, acquired: Secondary | ICD-10-CM | POA: Diagnosis not present

## 2019-07-28 DIAGNOSIS — N401 Enlarged prostate with lower urinary tract symptoms: Secondary | ICD-10-CM | POA: Diagnosis not present

## 2019-07-31 ENCOUNTER — Telehealth: Payer: Self-pay

## 2019-07-31 NOTE — Telephone Encounter (Signed)
Please order  labs order for upcoming appt

## 2019-07-31 NOTE — Telephone Encounter (Signed)
Labs have been ordered

## 2019-08-12 ENCOUNTER — Ambulatory Visit: Payer: Medicare Other | Admitting: Family Medicine

## 2019-08-26 ENCOUNTER — Other Ambulatory Visit: Payer: Self-pay | Admitting: *Deleted

## 2019-08-26 MED ORDER — AMLODIPINE BESYLATE 5 MG PO TABS: 5.0000 mg | ORAL_TABLET | Freq: Every day | ORAL | 3 refills | Status: DC

## 2019-09-04 ENCOUNTER — Other Ambulatory Visit: Payer: Self-pay | Admitting: *Deleted

## 2019-09-04 ENCOUNTER — Encounter: Payer: Self-pay | Admitting: Family Medicine

## 2019-09-04 MED ORDER — AZELASTINE HCL 0.1 % NA SOLN: NASAL | 3 refills | Status: DC

## 2019-09-04 NOTE — Telephone Encounter (Signed)
This has been sent to mail order pharmacy

## 2019-09-18 ENCOUNTER — Encounter: Payer: Self-pay | Admitting: Family Medicine

## 2019-09-19 ENCOUNTER — Encounter: Payer: Self-pay | Admitting: Family Medicine

## 2019-09-21 DIAGNOSIS — D61818 Other pancytopenia: Secondary | ICD-10-CM | POA: Diagnosis not present

## 2019-09-21 DIAGNOSIS — D696 Thrombocytopenia, unspecified: Secondary | ICD-10-CM | POA: Diagnosis not present

## 2019-09-23 ENCOUNTER — Other Ambulatory Visit: Payer: Self-pay

## 2019-09-23 ENCOUNTER — Encounter: Payer: Self-pay | Admitting: Family Medicine

## 2019-09-23 ENCOUNTER — Ambulatory Visit (INDEPENDENT_AMBULATORY_CARE_PROVIDER_SITE_OTHER): Payer: Medicare Other | Admitting: Family Medicine

## 2019-09-23 VITALS — BP 124/82 | HR 81 | Temp 97.4°F | Resp 16 | Ht 73.0 in | Wt 188.0 lb

## 2019-09-23 DIAGNOSIS — E785 Hyperlipidemia, unspecified: Secondary | ICD-10-CM | POA: Diagnosis not present

## 2019-09-23 DIAGNOSIS — R1013 Epigastric pain: Secondary | ICD-10-CM | POA: Diagnosis not present

## 2019-09-23 DIAGNOSIS — M25572 Pain in left ankle and joints of left foot: Secondary | ICD-10-CM | POA: Diagnosis not present

## 2019-09-23 DIAGNOSIS — J309 Allergic rhinitis, unspecified: Secondary | ICD-10-CM | POA: Diagnosis not present

## 2019-09-23 DIAGNOSIS — W57XXXA Bitten or stung by nonvenomous insect and other nonvenomous arthropods, initial encounter: Secondary | ICD-10-CM

## 2019-09-23 DIAGNOSIS — I10 Essential (primary) hypertension: Secondary | ICD-10-CM | POA: Diagnosis not present

## 2019-09-23 DIAGNOSIS — R142 Eructation: Secondary | ICD-10-CM | POA: Diagnosis not present

## 2019-09-23 DIAGNOSIS — G8929 Other chronic pain: Secondary | ICD-10-CM | POA: Diagnosis not present

## 2019-09-23 NOTE — Patient Instructions (Addendum)
F/U in office with MD in 4 to 5 months, call if you need me sooner  RMSF and lyme titer today, CBC, fasting lipid, cmp and eGFR , tSH as soon as possible  H pylori in two weeks, STOP omeprazole today so you  can get this test  Doxycycline is prescribed, you may take presumptively until the titers come back for tick borne disease   You are referred to Dr Aline Brochure re left ankle pain  Thanks for choosing [facility], we consider it a privelige to serve you.   Tick Bite Information, Adult  Ticks are insects that can bite. Most ticks live in shrubs and grassy areas. They climb onto people and animals that go by. Then they bite. Some ticks carry germs that can make you sick. How can I prevent tick bites?  Use an insect repellent that has 20% or higher of the ingredients DEET, picaridin, or IR3535. Put this insect repellent on: ? Bare skin. ? The tops of your boots. ? Your pant legs. ? The ends of your sleeves.  If you use an insect repellent that has the ingredient permethrin, make sure to follow the instructions on the bottle. Treat the following: ? Clothing. ? Supplies. ? Boots. ? Tents.  Wear long sleeves, long pants, and light colors.  Tuck your pant legs into your socks.  Stay in the middle of the trail.  Try not to walk through long grass.  Before going inside your house, check your clothes, hair, and skin for ticks. Make sure to check your head, neck, armpits, waist, groin, and joint areas.  Check for ticks every day.  When you come indoors: ? Wash your clothes right away. ? Shower right away. ? Dry your clothes in a dryer on high heat for 60 minutes or more. What is the right way to remove a tick? Remove a tick from your skin as soon as possible.  To remove a tick that is crawling on your skin: ? Go outdoors and brush the tick off. ? Use tape or a lint roller.  To remove a tick that is biting: ? Wash your hands. ? If you have latex gloves, put  them on. ? Use tweezers, curved forceps, or a tick-removal tool to grasp the tick. Grasp the tick as close to your skin and as close to the tick's head as possible. ? Gently pull up until the tick lets go.  Try to keep the tick's head attached to its body.  Do not twist or jerk the tick.  Do not squeeze or crush the tick. Do not try to remove a tick with heat, alcohol, petroleum jelly, or fingernail polish. How should I get rid of a tick? Here are some ways to get rid of a tick that is alive:  Place the tick in rubbing alcohol.  Place the tick in a bag or container you can close tightly.  Wrap the tick tightly in tape.  Flush the tick down the toilet. Contact a doctor if:  You have symptoms of a disease, such as: ? Pain in a muscle, joint, or bone. ? Trouble walking or moving your legs. ? Numbness in your legs. ? Inability to move (paralysis). ? A red rash that makes a circle (bull's-eye rash). ? Redness and swelling where the tick bit you. ? A fever. ? Throwing up (vomiting) over and over. ? Diarrhea. ? Weight loss. ? Tender and swollen lymph glands. ? Shortness of breath. ? Cough. ? Belly  pain (abdominal pain). ? Headache. ? Being more tired than normal. ? A change in how alert (conscious) you are. ? Confusion. Get help right away if:  You cannot remove a tick.  A part of a tick breaks off and gets stuck in your skin.  You are feeling worse. Summary  Ticks may carry germs that can make you sick.  To prevent tick bites, wear long sleeves, long pants, and light colors. Use insect repellent. Follow the instructions on the bottle.  If the tick is biting, do not try to remove it with heat, alcohol, petroleum jelly, or fingernail polish.  Use tweezers, curved forceps, or a tick-removal tool to grasp the tick. Gently pull up until the tick lets go. Do not twist or jerk the tick. Do not squeeze or crush the tick.  If you have symptoms, contact a doctor. This  information is not intended to replace advice given to you by your health care provider. Make sure you discuss any questions you have with your health care provider. Document Revised: 03/17/2018 Document Reviewed: 07/13/2016 Elsevier Patient Education  [place].

## 2019-09-23 NOTE — Progress Notes (Signed)
Mark Sandoval     MRN: [id number]      DOB: [birth year]   HPI Mark Sandoval is here for follow up and re-evaluation of chronic medical conditions, medication management and review of any available recent lab and radiology data.  Preventive health is updated, specifically  Cancer screening and Immunization.   Questions or concerns regarding consultations or procedures which the PT has had in the interim are  addressed. The PT denies any adverse reactions to current medications since the last visit.  2 month h/o localized left ankle pain rated at a 6 with walking  No specific trauma , walks  1.5 miles 3 days/ week for over 4 months Tick removed from left groin about 10 days ago, completely, states excess itching in the area, and new headache yesterday concerned re possibility of tick  Borne illness has residual doxycycline,started this yesterday 3 week h/o central abdominal discomfort and burping after every meal which started with steak meal  3 weeks ago , omeprazole has helped a lot, occurs with lunch and dinner, has reduced fat in diet, has cholecystectomy  ROS Denies recent fever or chills. Denies sinus pressure, nasal congestion, ear pain or sore throat. Denies chest congestion, productive cough or wheezing. Denies chest pains, palpitations and leg swelling Denies, nausea, vomiting,diarrhea or constipation.   Denies dysuria, frequency, hesitancy or incontinence.  Denies headaches, seizures, numbness, or tingling. Denies depression, anxiety or insomnia.  PE  BP 124/82   Pulse 81   Temp (!) 97.4 F (36.3 C) (Temporal)   Resp 16   Ht 6\' 1"  (1.854 m)   Wt 188 lb (85.3 kg)   SpO2 98%   BMI 24.80 kg/m   Patient alert and oriented and in no cardiopulmonary distress.  HEENT: No facial asymmetry, EOMI,     Neck supple .  Chest: Clear to auscultation bilaterally.  CVS: S1, S2 no murmurs, no S3.Regular rate.  ABD: Soft non tender.   Ext: No edema  MS: Adequate ROM spine,  shoulders, hips and knees.Localized tenderness over left ankle ateral aspect  Skin: Intact, no ulcerations or rash noted.Groin not examined  Psych: Good eye contact, normal affect. Memory intact not anxious or depressed appearing.  CNS: CN 2-12 intact, power,  normal throughout.no focal deficits noted.   Assessment & Plan  Left ankle pain Refer to Ortho , 2 month duration and aggravated by walking, localiized tenderness  Tick bite Occurred 10 days ago.  Reports complete removal from left groin.  Will repeat [facility] spotted fever and Lyme titers has new complaint of headache and is concerned for the possibility of infection.  Patient is prescribed doxycycline and will take this until he is advised that both titers are negative.  Essential hypertension Controlled, no change in medication DASH diet and commitment to daily physical activity for a minimum of 30 minutes discussed and encouraged, as a part of hypertension management. The importance of attaining a healthy weight is also discussed.  BP/Weight 09/23/2019 07/02/2019 03/06/2019 02/03/2019 08/13/2018 04/10/2018 60/73/7106  Systolic BP 269 485 462 703 500 938 182  Diastolic BP 82 82 90 86 86 97 80  Wt. (Lbs) 188 190.6 190 181 181 185 189  BMI 24.8 25.15 25.07 23.88 23.88 24.41 24.94       Allergic rhinitis Controlled on medication as needed  Burping 3 week history, started after steak and has continued. PPI has  Afforded some Urelief. Will get H pylori test when off pPI x 2 weeks Also  has upcoming gI appt

## 2019-09-24 ENCOUNTER — Encounter: Payer: Self-pay | Admitting: Family Medicine

## 2019-09-24 ENCOUNTER — Other Ambulatory Visit: Payer: Self-pay

## 2019-09-24 DIAGNOSIS — W57XXXA Bitten or stung by nonvenomous insect and other nonvenomous arthropods, initial encounter: Secondary | ICD-10-CM

## 2019-09-25 LAB — COMPLETE METABOLIC PANEL WITH GFR
AG Ratio: 1.8 (calc) (ref 1.0–2.5)
ALT: 19 U/L (ref 9–46)
AST: 21 U/L (ref 10–35)
Albumin: 4.3 g/dL (ref 3.6–5.1)
Alkaline phosphatase (APISO): 105 U/L (ref 35–144)
BUN/Creatinine Ratio: 11 (calc) (ref 6–22)
BUN: 15 mg/dL (ref 7–25)
CO2: 28 mmol/L (ref 20–32)
Calcium: 8.9 mg/dL (ref 8.6–10.3)
Chloride: 104 mmol/L (ref 98–110)
Creat: 1.36 mg/dL — ABNORMAL HIGH (ref 0.70–1.25)
GFR, Est African American: 62 mL/min/{1.73_m2} (ref >=60)
GFR, Est Non African American: 53 mL/min/{1.73_m2} — ABNORMAL LOW (ref >=60)
Globulin: 2.4 g/dL (calc) (ref 1.9–3.7)
Glucose, Bld: 84 mg/dL (ref 65–99)
Potassium: 4.3 mmol/L (ref 3.5–5.3)
Sodium: 140 mmol/L (ref 135–146)
Total Bilirubin: 0.6 mg/dL (ref 0.2–1.2)
Total Protein: 6.7 g/dL (ref 6.1–8.1)

## 2019-09-25 LAB — B. BURGDORFI ANTIBODIES: B burgdorferi Ab IgG+IgM: <0.9 index

## 2019-09-25 LAB — CBC
HCT: 41.5 % (ref 38.5–50.0)
Hemoglobin: 13.1 g/dL — ABNORMAL LOW (ref 13.2–17.1)
MCH: 22.7 pg — ABNORMAL LOW (ref 27.0–33.0)
MCHC: 31.6 g/dL — ABNORMAL LOW (ref 32.0–36.0)
MCV: 71.9 fL — ABNORMAL LOW (ref 80.0–100.0)
Platelets: 140 10*3/uL (ref 140–400)
RBC: 5.77 10*6/uL (ref 4.20–5.80)
RDW: 17.1 % — ABNORMAL HIGH (ref 11.0–15.0)
WBC: 4.5 10*3/uL (ref 3.8–10.8)

## 2019-09-25 LAB — ROCKY MTN SPOTTED FVR ABS PNL(IGG+IGM)
RMSF IgG: NOT DETECTED
RMSF IgM: NOT DETECTED

## 2019-09-25 LAB — LIPID PANEL
Cholesterol: 164 mg/dL (ref <200)
HDL: 53 mg/dL (ref >=40)
LDL Cholesterol (Calc): 97 mg/dL (calc)
Non-HDL Cholesterol (Calc): 111 mg/dL (calc) (ref <130)
Total CHOL/HDL Ratio: 3.1 (calc) (ref <5.0)
Triglycerides: 58 mg/dL (ref <150)

## 2019-09-25 LAB — TSH: TSH: 1.84 mIU/L (ref 0.40–4.50)

## 2019-09-29 ENCOUNTER — Encounter: Payer: Self-pay | Admitting: Family Medicine

## 2019-09-29 DIAGNOSIS — W57XXXA Bitten or stung by nonvenomous insect and other nonvenomous arthropods, initial encounter: Secondary | ICD-10-CM | POA: Insufficient documentation

## 2019-09-29 NOTE — Assessment & Plan Note (Signed)
Refer to Ortho , 2 month duration and aggravated by walking, localiized tenderness

## 2019-09-29 NOTE — Assessment & Plan Note (Signed)
Occurred 10 days ago.  Reports complete removal from left groin.  Will repeat [facility] spotted fever and Lyme titers has new complaint of headache and is concerned for the possibility of infection.  Patient is prescribed doxycycline and will take this until he is advised that both titers are negative.

## 2019-09-29 NOTE — Assessment & Plan Note (Signed)
Controlled, no change in medication DASH diet and commitment to daily physical activity for a minimum of 30 minutes discussed and encouraged, as a part of hypertension management. The importance of attaining a healthy weight is also discussed.  BP/Weight 09/23/2019 07/02/2019 03/06/2019 02/03/2019 08/13/2018 04/10/2018 69/50/7225  Systolic BP 750 518 335 825 189 842 103  Diastolic BP 82 82 90 86 86 97 80  Wt. (Lbs) 188 190.6 190 181 181 185 189  BMI 24.8 25.15 25.07 23.88 23.88 24.41 24.94

## 2019-09-30 ENCOUNTER — Encounter: Payer: Self-pay | Admitting: Family Medicine

## 2019-10-01 DIAGNOSIS — R142 Eructation: Secondary | ICD-10-CM | POA: Insufficient documentation

## 2019-10-01 NOTE — Assessment & Plan Note (Signed)
Controlled on medication as needed

## 2019-10-01 NOTE — Assessment & Plan Note (Addendum)
3 week history, started after steak and has continued. PPI has  Afforded some Urelief. Will get H pylori test when off pPI x 2 weeks Also has upcoming gI appt

## 2019-10-02 ENCOUNTER — Encounter: Payer: Self-pay | Admitting: Orthopedic Surgery

## 2019-10-02 ENCOUNTER — Ambulatory Visit: Payer: Medicare Other

## 2019-10-02 ENCOUNTER — Other Ambulatory Visit: Payer: Self-pay

## 2019-10-02 ENCOUNTER — Ambulatory Visit (INDEPENDENT_AMBULATORY_CARE_PROVIDER_SITE_OTHER): Payer: Medicare Other | Admitting: Orthopedic Surgery

## 2019-10-02 VITALS — BP 142/97 | HR 77 | Ht 73.0 in | Wt 187.0 lb

## 2019-10-02 DIAGNOSIS — G8929 Other chronic pain: Secondary | ICD-10-CM

## 2019-10-02 DIAGNOSIS — M25572 Pain in left ankle and joints of left foot: Secondary | ICD-10-CM

## 2019-10-02 DIAGNOSIS — M25571 Pain in right ankle and joints of right foot: Secondary | ICD-10-CM

## 2019-10-02 DIAGNOSIS — M76892 Other specified enthesopathies of left lower limb, excluding foot: Secondary | ICD-10-CM | POA: Diagnosis not present

## 2019-10-02 NOTE — Progress Notes (Signed)
NEW PROBLEM//OFFICE VISIT  Chief Complaint  Patient presents with  . 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  The patient said he was walking in a store felt pain in his ankle had some swelling was difficult for him to keep up with his wife who he was shopping with.  He presents now with lateral ankle pain which actually went down.  The pain was over the anterolateral ligament and was similar in position on the opposite side when it was present  He denies any trauma.  He does play golf frequently but does not note any injury  He also has persistent pain in the left hip from a prior hip flexor injury and would like to go back to therapy for that   Review of Systems  Constitutional: Negative for chills and fever.  Musculoskeletal: Positive for joint pain.  Neurological: Negative for tingling.     Past Medical History:  Diagnosis Date  . Dyslipidemia   . GERD (gastroesophageal reflux disease)   . History of seasonal allergies   . Hypertension   . Leukopenia   . Lump or mass in breast    benign  . Thrombocytopenia ([place])     Past Surgical History:  Procedure Laterality Date  . athroscopy of left knee  2002  . CHOLECYSTECTOMY  2007  . COLONOSCOPY N/A 03/29/2016   Procedure: COLONOSCOPY;  Surgeon: Rogene Houston, MD;  Location: [facility];  Service: Endoscopy;  Laterality: N/A;  1030  . removal of benign left breast lump  09/06/07    Family History  Problem Relation Age of Onset  . Cancer Mother        hematologic cancer   . Hypertension Mother   . Alcohol abuse Father    Social History   Tobacco Use  . Smoking status: Never Smoker  . Smokeless tobacco: Never Used  Substance Use Topics  . Alcohol use: Yes    Alcohol/week: 0.0 standard drinks    Comment: occasional  . Drug use: No     Allergies  Allergen Reactions  . Compazine Other (See Comments)    tongue movement disorders/MD in ER stated he would have expired  . Daucus [place] Other (See Comments)    Mouth swollen    Current Meds  Medication Sig  . amLODipine (NORVASC) 5 MG tablet Take 1 tablet (5 mg total) by mouth daily.    BP (!) 142/97   Pulse 77   Ht 6\' 1"  (1.854 m)   Wt 187 lb (84.8 kg)   BMI 24.67 kg/m   Physical Exam Constitutional:      General: He is not in acute distress.    Appearance: He is well-developed.  Cardiovascular:     Comments: No peripheral edema Skin:    General: Skin is warm and dry.  Neurological:     Mental Status: He is alert and oriented to person, place, and time.     Sensory: No sensory deficit.     Coordination: Coordination normal.     Gait: Gait normal.     Deep Tendon Reflexes: Reflexes are normal and symmetric.     Ortho Exam  Right and left ankle show tenderness over the AT [facility] with no medial tenderness  Patient has slight flexible pes planus which reconstitutes well with tiptoe  standing.  There is no effusion of the joint there is no evidence of swelling in there at this time his drawer test was normal inversion eta eversion test was normal peroneal tendons were intact Achilles tendon was nontender no swelling or nodularity  X-rays of the ankle were normal  MEDICAL DECISION MAKING  A.  Encounter Diagnoses  Name Primary?  . Chronic pain of both ankles Yes  . Tendonitis of left hip flexor     B. DATA ANALYSED:  AP and lateral and oblique right and left ankle were normal   C. MANAGEMENT   Recommend ankle exercises  Physical therapy left hip    No orders of the defined types were placed in this encounter.     Arther Abbott, MD  10/02/2019 11:01 AM

## 2019-10-02 NOTE — Patient Instructions (Signed)
Strengthening exercises Eccentric plantar flexion Eccentric exercises cause a lengthening of a muscle against an external resistance. 1. Stand on the balls of your feet on the edge of a step. The ball of your foot is on the walking surface, right under your toes. ? Do not put your heels on the step. ? Rest your hand on a railing for balance. 2. If told by your health care provider, put on a backpack to add weight. 3. Rise up onto your toes (plantar flexion). 4. Keep your heels up while you slowly shift your body weight to your left / right foot. 5. Pick up your other foot. 6. Slowly lower your weight through your left / right foot so your heel drops below the level of the step (eccentric). If this causes any pain at the front of your ankle, stop the motion prior to feeling any pain. 7. Put your other foot back on the step. Repeat __________ times. Complete this exercise __________ times a day. Ankle eversion 1. Sit on the floor with your legs straight out in front of you. 2. Loop a rubber exercise band around the ball of your left / right foot. The ball of your foot is on the walking surface, right under your toes. ? Hold the ends of the band in your hands, or secure the band to a stable object. ? The band should be slightly tense when your foot is relaxed. 3. Slowly push your foot outward, away from your other leg (eversion). 4. Hold this position for __________ seconds. 5. Slowly return your foot to the starting position. Repeat __________ times. Complete this exercise __________ times a day. Ankle dorsiflexion  6. Secure a rubber exercise band or tube to an object, such as a table leg, that will stay still when the band is pulled. Secure the other end around your left / right foot. 7. Sit on the floor facing the object, with your left / right leg extended. The band or tube should be slightly tense when your foot is relaxed. 8. Slowly bring your foot toward you, bringing the top of your  foot toward your shin (dorsiflexion), and pulling the band tighter. 9. Hold this position for __________ seconds. 10. Slowly return your foot to the starting position. Repeat __________ times. Complete this exercise __________ times a day. Ankle plantar flexion  1. Sit on the floor with your left / right leg extended. 2. Loop a rubber exercise tube or band around the ball of your left / right foot. The ball of your foot is on the walking surface, right under your toes. ? Hold the ends of the band or tube in your hands. ? The band or tube should be slightly tense when your foot is relaxed. 3. Slowly point your foot and toes downward to tilt the top of your foot away from your shin (plantar flexion). 4. Hold this position for __________ seconds. 5. Slowly return your foot to the starting position. Repeat __________ times. Complete this exercise __________ times a day. Ankle eversion 1. Sit on the floor with your legs straight out in front of you. 2. Loop a rubber exercise band or tube around the ball of your left / right foot. The ball of your foot is on the walking surface, right under your toes. ? Hold the ends of the band in your hands, or secure the band to a stable object. ? The band or tube should be slightly tense when your foot is relaxed. 3. Slowly push  your foot outward, away from your other leg (eversion). 4. Hold this position for __________ seconds. 5. Slowly return your foot to the starting position. Repeat __________ times. Complete this exercise __________ times a day. This information is not intended to replace advice given to you by your health care provider. Make sure you discuss any questions you have with your health care provider. Document Revised: 07/22/2018 Document Reviewed: 01/13/2018 Elsevier Patient Education  2020 [employer].

## 2019-10-07 ENCOUNTER — Encounter: Payer: Self-pay | Admitting: Family Medicine

## 2019-10-08 LAB — H. PYLORI BREATH TEST: H. pylori Breath Test: NOT DETECTED

## 2019-10-15 ENCOUNTER — Ambulatory Visit (INDEPENDENT_AMBULATORY_CARE_PROVIDER_SITE_OTHER): Payer: Medicare Other | Admitting: Family Medicine

## 2019-10-15 ENCOUNTER — Other Ambulatory Visit: Payer: Self-pay

## 2019-10-15 ENCOUNTER — Encounter: Payer: Self-pay | Admitting: Family Medicine

## 2019-10-15 VITALS — BP 126/81 | HR 80 | Resp 15 | Ht 73.0 in | Wt 186.0 lb

## 2019-10-15 DIAGNOSIS — D72819 Decreased white blood cell count, unspecified: Secondary | ICD-10-CM | POA: Diagnosis not present

## 2019-10-15 DIAGNOSIS — R1084 Generalized abdominal pain: Secondary | ICD-10-CM

## 2019-10-15 DIAGNOSIS — R142 Eructation: Secondary | ICD-10-CM

## 2019-10-15 DIAGNOSIS — I10 Essential (primary) hypertension: Secondary | ICD-10-CM | POA: Diagnosis not present

## 2019-10-15 DIAGNOSIS — R140 Abdominal distension (gaseous): Secondary | ICD-10-CM

## 2019-10-15 DIAGNOSIS — R109 Unspecified abdominal pain: Secondary | ICD-10-CM

## 2019-10-15 DIAGNOSIS — W57XXXD Bitten or stung by nonvenomous insect and other nonvenomous arthropods, subsequent encounter: Secondary | ICD-10-CM

## 2019-10-15 DIAGNOSIS — R630 Anorexia: Secondary | ICD-10-CM

## 2019-10-15 NOTE — Assessment & Plan Note (Signed)
Controlled, no change in medication DASH diet and commitment to daily physical activity for a minimum of 30 minutes discussed and encouraged, as a part of hypertension management. The importance of attaining a healthy weight is also discussed.  BP/Weight 10/15/2019 10/02/2019 09/23/2019 07/02/2019 03/06/2019 02/03/2019 3/34/3568  Systolic BP 616 837 290 211 155 208 022  Diastolic BP 81 97 82 82 90 86 86  Wt. (Lbs) 186 187 188 190.6 190 181 181  BMI 24.54 24.67 24.8 25.15 25.07 23.88 23.88

## 2019-10-15 NOTE — Patient Instructions (Signed)
Wellness in October   MD f/U reschedule to early December, call if you need me sooner  Excellent labs and blood pressure  We will try to get an as soon as possible appt for you at [facility] and let you know  Thanks for choosing [facility], we consider it a privelige to serve you.

## 2019-10-16 ENCOUNTER — Encounter: Payer: Self-pay | Admitting: Family Medicine

## 2019-10-16 NOTE — Assessment & Plan Note (Signed)
Most recent titers are negative for Lyme and RMSF

## 2019-10-16 NOTE — Assessment & Plan Note (Signed)
Approx 5 week h/o of abdominal bloating , discomfort, burping, reduced appetite and early satiety. S/p cholecystectomy, abrupt symptom onset. Normal LFT and breath test is negative Refer or asap GI eval, will reach out to [facility] where he gets most of his care, has local GI appt end [place] but would like to be seen sooner as he" knows that something is wrong"

## 2019-10-16 NOTE — Progress Notes (Signed)
Mark Sandoval     MRN: [id number]      DOB: [birth year]   HPI Mark Sandoval is here for follow up and re-evaluation of chronic medical conditions, medication management and review of any available recent lab and radiology data.  Preventive health is updated, specifically  Cancer screening and Immunization.   Patient is still awaiting appointment with GI abdominal discomfort bloating and early satiety which has been happening for approximately 5 to 6 weeks.  His appointment is at the end of July and he states that if these not satisfied with local evaluation he will be seeking a second opinion at [facility].  He actually also has evaluations both osteochondral clinic staff offered to refer him to [facility] as well to see if he gets a sooner appointment there he is in agreement with this.    Patient states that he knows that  something is wrong.  He states that ever since having a steak meal several weeks ago he said he abruptly has had marked decrease in appetite,withh bloating, burping  and early satiety  He has recently had hepatic panel and beath test which are normal PPI which he is taking provides some relief, denies dysphagia    The only meal which he now enjoys is  breakfast which is oatmeal with yogurt and fruit.  ROS Denies recent fever or chills. Denies sinus pressure, nasal congestion, ear pain or sore throat. Denies chest congestion, productive cough or wheezing. Denies chest pains, palpitations and leg swelling Denies change in bowel habit, constipation or diarrhea  Denies dysuria, frequency, hesitancy or incontinence. Denies significant joint pain, swelling and limitation in mobility. Denies headaches, seizures, numbness, or tingling. Denies depression, anxiety or insomnia. Denies skin break down or rash.   PE  BP 126/81   Pulse 80   Resp 15   Ht 6\' 1"  (1.854 m)   Wt 186 lb (84.4 kg)   SpO2 98%   BMI 24.54 kg/m   Patient alert and oriented and in no  cardiopulmonary distress.  HEENT: No facial asymmetry, EOMI,     Neck supple .  Chest: Clear to auscultation bilaterally.  CVS: S1, S2 no murmurs, no S3.Regular rate.    Ext: No edema  MS: Adequate ROM spine, shoulders, hips and knees.  Skin: Intact, no ulcerations or rash noted.  Psych: Good eye contact, normal affect. Memory intact not anxious or depressed appearing.  CNS: CN 2-12 intact, power,  normal throughout.no focal deficits noted.   Assessment & Plan  Essential hypertension Controlled, no change in medication DASH diet and commitment to daily physical activity for a minimum of 30 minutes discussed and encouraged, as a part of hypertension management. The importance of attaining a healthy weight is also discussed.  BP/Weight 10/15/2019 10/02/2019 09/23/2019 07/02/2019 03/06/2019 02/03/2019 9/38/1829  Systolic BP 937 169 678 938 101 751 025  Diastolic BP 81 97 82 82 90 86 86  Wt. (Lbs) 186 187 188 190.6 190 181 181  BMI 24.54 24.67 24.8 25.15 25.07 23.88 23.88       Leukocytopenia Chronic and stable  Tick bite Most recent titers are negative for Lyme and RMSF  Burping Approx 5 week h/o of abdominal bloating , discomfort, burping, reduced appetite and early satiety. S/p cholecystectomy, abrupt symptom onset. Normal LFT and breath test is negative Refer or asap GI eval, will reach out to [facility] where he gets most of his care, has local GI appt end [place] but would like to be seen  sooner as he" knows that something is wrong"

## 2019-10-16 NOTE — Assessment & Plan Note (Signed)
Chronic and stable.   

## 2019-10-20 ENCOUNTER — Ambulatory Visit (INDEPENDENT_AMBULATORY_CARE_PROVIDER_SITE_OTHER): Payer: Medicare Other | Admitting: Gastroenterology

## 2019-10-26 ENCOUNTER — Encounter: Payer: Self-pay | Admitting: Family Medicine

## 2019-10-26 DIAGNOSIS — S76012A Strain of muscle, fascia and tendon of left hip, initial encounter: Secondary | ICD-10-CM | POA: Diagnosis not present

## 2019-10-28 ENCOUNTER — Encounter (INDEPENDENT_AMBULATORY_CARE_PROVIDER_SITE_OTHER): Payer: Self-pay

## 2019-10-29 ENCOUNTER — Encounter (INDEPENDENT_AMBULATORY_CARE_PROVIDER_SITE_OTHER): Payer: Self-pay

## 2019-11-04 ENCOUNTER — Ambulatory Visit (INDEPENDENT_AMBULATORY_CARE_PROVIDER_SITE_OTHER): Payer: Medicare Other | Admitting: Orthopedic Surgery

## 2019-11-04 ENCOUNTER — Encounter: Payer: Self-pay | Admitting: Orthopedic Surgery

## 2019-11-04 ENCOUNTER — Other Ambulatory Visit: Payer: Self-pay

## 2019-11-04 VITALS — BP 151/87 | HR 71 | Ht 73.0 in | Wt 183.0 lb

## 2019-11-04 DIAGNOSIS — G8929 Other chronic pain: Secondary | ICD-10-CM | POA: Diagnosis not present

## 2019-11-04 DIAGNOSIS — M25552 Pain in left hip: Secondary | ICD-10-CM

## 2019-11-04 NOTE — Progress Notes (Signed)
Jaevon N Beebe  11/04/2019  Body mass index is 24.14 kg/m.   S:  Chief Complaint  Patient presents with  . Ankle Pain    feels better    69 year old male injured his hip over a year ago thought to have had a hip flexor injury had physical therapy improved slightly.  However, he still complains of discomfort in the left groin and giving way of the left groin when pivoting to his left after sitting  He had a repeat course of physical therapy and anti-inflammatories and did not improve  His ankles after home therapy are doing fine  Review of systems no back pain numbness or tingling O: BP (!) 151/87   Pulse 71   Ht 6\' 1"  (1.854 m)   Wt 183 lb (83 kg)   BMI 24.14 kg/m   Physical Exam Is awake alert and oriented x3 seems to have good grooming body habitus is normal Flexion internal rotation pain  Gait normal   MEDICAL DECISION MAKING  Encounter Diagnosis  Name Primary?  . Chronic left hip pain Yes      IMAGING: Independent interpretation of images: X-ray of the hip shows what appears to be normal femoral heads normal acetabulum slight varus neck  Outside records reviewed:   MANAGEMENT   Patient seems to have a catching giving sensation related to his left hip injury  Discussed this with him at length we decided to go ahead and do an MRI with contrast to see if he has a labral tear  He is concerned because his leg gives way with no warning after sitting and he is almost injured himself several times  No orders of the defined types were placed in this encounter.     Arther Abbott, MD  11/04/2019 8:51 AM

## 2019-11-09 ENCOUNTER — Encounter: Payer: Self-pay | Admitting: Family Medicine

## 2019-11-09 DIAGNOSIS — H43813 Vitreous degeneration, bilateral: Secondary | ICD-10-CM | POA: Diagnosis not present

## 2019-11-09 DIAGNOSIS — H3589 Other specified retinal disorders: Secondary | ICD-10-CM | POA: Diagnosis not present

## 2019-11-11 ENCOUNTER — Encounter: Payer: Self-pay | Admitting: Family Medicine

## 2019-11-12 ENCOUNTER — Ambulatory Visit (INDEPENDENT_AMBULATORY_CARE_PROVIDER_SITE_OTHER): Payer: Medicare Other | Admitting: Gastroenterology

## 2019-12-04 ENCOUNTER — Other Ambulatory Visit: Payer: Self-pay

## 2019-12-04 ENCOUNTER — Encounter (HOSPITAL_COMMUNITY): Payer: Self-pay

## 2019-12-04 ENCOUNTER — Ambulatory Visit (HOSPITAL_COMMUNITY)
Admission: RE | Admit: 2019-12-04 | Discharge: 2019-12-04 | Disposition: A | Discharge status: Home / Self Care | Payer: Medicare Other | Source: Ambulatory Visit | Attending: Orthopedic Surgery | Admitting: Orthopedic Surgery

## 2019-12-04 DIAGNOSIS — G8929 Other chronic pain: Secondary | ICD-10-CM | POA: Insufficient documentation

## 2019-12-04 DIAGNOSIS — S73192A Other sprain of left hip, initial encounter: Secondary | ICD-10-CM | POA: Diagnosis not present

## 2019-12-04 DIAGNOSIS — M25552 Pain in left hip: Secondary | ICD-10-CM | POA: Insufficient documentation

## 2019-12-04 MED ORDER — LIDOCAINE HCL (PF) 1 % IJ SOLN
INTRAMUSCULAR | Status: AC
  Administered 2019-12-04: 5 mL
  Filled 2019-12-04: qty 5

## 2019-12-04 MED ORDER — POVIDONE-IODINE 10 % EX SOLN
CUTANEOUS | Status: AC
  Administered 2019-12-04: 1
  Filled 2019-12-04: qty 15

## 2019-12-04 MED ORDER — SODIUM CHLORIDE (PF) 0.9 % IJ SOLN
INTRAMUSCULAR | Status: AC
  Administered 2019-12-04: 5 mL
  Filled 2019-12-04: qty 10

## 2019-12-04 MED ORDER — GADOBUTROL 1 MMOL/ML IV SOLN
2.0000 mL | Freq: Once | INTRAVENOUS | Status: AC | PRN
  Administered 2019-12-04: 2 mL

## 2019-12-04 NOTE — Procedures (Signed)
Preprocedure Dx: Chronic LEFT hip pain Postprocedure Dx: Chronic LEFT hip pain Procedure  Fluoroscopically guided LEFT joint injection for MR arthrogrpahy Radiologist:  Thornton Papas Anesthesia:  4 ml of 1% lidocaine Injectate:  10 ml of [0.05 ml Gadovist, 5 ml sterile saline, 15 ml Omni-180] Fluoro time:  0 minutes 54 seconds EBL:   None Complications: None

## 2019-12-09 ENCOUNTER — Ambulatory Visit (INDEPENDENT_AMBULATORY_CARE_PROVIDER_SITE_OTHER): Payer: Medicare Other | Admitting: Orthopedic Surgery

## 2019-12-09 ENCOUNTER — Encounter: Payer: Self-pay | Admitting: Orthopedic Surgery

## 2019-12-09 ENCOUNTER — Other Ambulatory Visit: Payer: Self-pay

## 2019-12-09 VITALS — BP 134/92 | HR 79 | Ht 73.0 in | Wt 187.0 lb

## 2019-12-09 DIAGNOSIS — M24159 Other articular cartilage disorders, unspecified hip: Secondary | ICD-10-CM | POA: Diagnosis not present

## 2019-12-09 NOTE — Progress Notes (Signed)
Chief Complaint  Patient presents with  . 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  IMPRESSION: The examination is positive for an anterior, superior left labral tear. The left hip otherwise appears normal.   Electronically Signed   By: Inge Rise M.D.   On: 12/04/2019 17:52  Visit based on time spent

## 2020-01-04 DIAGNOSIS — Z23 Encounter for immunization: Secondary | ICD-10-CM | POA: Diagnosis not present

## 2020-02-03 ENCOUNTER — Ambulatory Visit (INDEPENDENT_AMBULATORY_CARE_PROVIDER_SITE_OTHER): Payer: Medicare Other | Admitting: Family Medicine

## 2020-02-03 ENCOUNTER — Other Ambulatory Visit: Payer: Self-pay

## 2020-02-03 ENCOUNTER — Encounter: Payer: Self-pay | Admitting: Family Medicine

## 2020-02-03 VITALS — BP 127/84 | HR 77 | Resp 15 | Ht 73.0 in | Wt 191.0 lb

## 2020-02-03 DIAGNOSIS — R229 Localized swelling, mass and lump, unspecified: Secondary | ICD-10-CM

## 2020-02-03 DIAGNOSIS — Z1211 Encounter for screening for malignant neoplasm of colon: Secondary | ICD-10-CM

## 2020-02-03 DIAGNOSIS — I10 Essential (primary) hypertension: Secondary | ICD-10-CM | POA: Diagnosis not present

## 2020-02-03 DIAGNOSIS — D171 Benign lipomatous neoplasm of skin and subcutaneous tissue of trunk: Secondary | ICD-10-CM | POA: Diagnosis not present

## 2020-02-03 DIAGNOSIS — Z23 Encounter for immunization: Secondary | ICD-10-CM | POA: Diagnosis not present

## 2020-02-03 NOTE — Assessment & Plan Note (Signed)
persistently nodule in belt line of prior tick bite refer to surgery for removal

## 2020-02-03 NOTE — Progress Notes (Signed)
   Mark Sandoval     MRN: [id number]      DOB: Aug 24, 1950   HPI [name] is here for follow up and re-evaluation of chronic medical conditions, medication management and review of any available recent lab and radiology data.  Preventive health is updated, specifically  Cancer screening and Immunization.   Questions or concerns regarding consultations or procedures which the PT has had in the interim are  addressed. The PT denies any adverse reactions to current medications since the last visit.  C/o itchy nodule under skin in belt line in area of previous tick biter wants this removed, also has [place] nodule in lower epigastric area, h/o lipomas in the past, has had several removed, generally when they hurt, current one is not painful but is enlarging   ROS Denies recent fever or chills. Denies sinus pressure, nasal congestion, ear pain or sore throat. Denies chest congestion, productive cough or wheezing. Denies chest pains, palpitations and leg swelling Denies abdominal pain, nausea, vomiting,diarrhea or constipation.   Denies dysuria, frequency, hesitancy or incontinence. Denies  Uncontrolled joint pain, swelling and limitation in mobility. Denies headaches, seizures, numbness, or tingling. Denies depression, anxiety or insomnia. [name]   PE  BP 127/84   Pulse 77   Resp 15   Ht 6\' 1"  (1.854 m)   Wt 191 lb (86.6 kg)   SpO2 98%   BMI 25.20 kg/m   Patient alert and oriented and in no cardiopulmonary distress.  HEENT: No facial asymmetry, EOMI,     Neck supple .  Chest: Clear to auscultation bilaterally.  CVS: S1, S2 no murmurs, no S3.Regular rate.  ABD: Soft non tender.   Ext: No edema  MS: Adequate ROM spine, shoulders, hips and knees.  Skin: Intact, nodule on mid abdomen in area of prior tick bite, and subcutaneous nodule/ lipoma left of umbilicus, non tender, pea sized  Psych: Good eye contact, normal affect. Memory intact not anxious or depressed  appearing.  CNS: CN 2-12 intact, power,  normal throughout.no focal deficits noted.   Assessment & Plan  Essential hypertension Controlled, no change in medication DASH diet and commitment to daily physical activity for a minimum of 30 minutes discussed and encouraged, as a part of hypertension management. The importance of attaining a healthy weight is also discussed.  BP/Weight 02/03/2020 12/09/2019 12/04/2019 11/04/2019 10/15/2019 0/63/0160 1/0/9323  Systolic BP 557 322 025 427 062 376 283  Diastolic BP 84 92 97 87 81 97 82  Wt. (Lbs) 191 187 - 183 186 187 188  BMI 25.2 24.67 - 24.14 24.54 24.67 24.8       Tick bite persistently nodule in belt line of prior tick bite refer to surgery for removal  Skin nodule Skin nodule of mid abdomen left of umbilicus, refer to gen surgery, has h/o several lipomas over the years which have been remmoved

## 2020-02-03 NOTE — Patient Instructions (Addendum)
F/U in office with MD in 6 months, call if you need me sooner  Flu vaccine today''   You are referred to Surgery re skin nodules/ lipoma  Cologuard to be arranged prior to leaving  Today  Fasting chem7 and EGFrrmid December after [facility] labs reviewed ( if any) , so please contact me after visit re labs  It is important that you exercise regularly at least 30 minutes 5 times a week. If you develop chest pain, have severe difficulty breathing, or feel very tired, stop exercising immediately and seek medical attention . Golfing is great  Vegetarian diet is the healthiest , stick with this!  Thanks for choosing [facility], we consider it a privelige to serve you.

## 2020-02-03 NOTE — Assessment & Plan Note (Signed)
Skin nodule of mid abdomen left of umbilicus, refer to gen surgery, has h/o several lipomas over the years which have been remmoved

## 2020-02-03 NOTE — Assessment & Plan Note (Signed)
Controlled, no change in medication DASH diet and commitment to daily physical activity for a minimum of 30 minutes discussed and encouraged, as a part of hypertension management. The importance of attaining a healthy weight is also discussed.  BP/Weight 02/03/2020 12/09/2019 12/04/2019 11/04/2019 10/15/2019 0/97/9499 10/15/8207  Systolic BP 906 893 406 840 335 331 740  Diastolic BP 84 92 97 87 81 97 82  Wt. (Lbs) 191 187 - 183 186 187 188  BMI 25.2 24.67 - 24.14 24.54 24.67 24.8

## 2020-02-04 ENCOUNTER — Ambulatory Visit (INDEPENDENT_AMBULATORY_CARE_PROVIDER_SITE_OTHER): Payer: Medicare Other

## 2020-02-04 VITALS — BP 127/84 | Ht 73.0 in | Wt 191.0 lb

## 2020-02-04 DIAGNOSIS — Z0000 Encounter for general adult medical examination without abnormal findings: Secondary | ICD-10-CM | POA: Diagnosis not present

## 2020-02-04 NOTE — Patient Instructions (Signed)
Mark Sandoval , Thank you for taking time to come for your Medicare Wellness Visit. I appreciate your ongoing commitment to your health goals. Please review the following plan we discussed and let me know if I can assist you in the future.   Screening recommendations/referrals: Colonoscopy: Up to date- Next due 2027 Recommended yearly ophthalmology/optometry visit for glaucoma screening and checkup Recommended yearly dental visit for hygiene and checkup  Vaccinations: Influenza vaccine: up to date  Pneumococcal vaccine: up to date  Tdap vaccine: due in 2028 Shingles vaccine: information given   Next appointment: wellness in 1 year   Preventive Care 69 Years and Older, Male Preventive care refers to lifestyle choices and visits with your health care provider that can promote health and wellness. What does preventive care include?  A yearly physical exam. This is also called an annual well check.  Dental exams once or twice a year.  Routine eye exams. Ask your health care provider how often you should have your eyes checked.  Personal lifestyle choices, including:  Daily care of your teeth and gums.  Regular physical activity.  Eating a healthy diet.  Avoiding tobacco and drug use.  Limiting alcohol use.  Practicing safe sex.  Taking low doses of aspirin every day.  Taking vitamin and mineral supplements as recommended by your health care provider. What happens during an annual well check? The services and screenings done by your health care provider during your annual well check will depend on your age, overall health, lifestyle risk factors, and family history of disease. Counseling  Your health care provider may ask you questions about your:  Alcohol use.  Tobacco use.  Drug use.  Emotional well-being.  Home and relationship well-being.  Sexual activity.  Eating habits.  History of falls.  Memory and ability to understand (cognition).  Work and work  Statistician. Screening  You may have the following tests or measurements:  Height, weight, and BMI.  Blood pressure.  Lipid and cholesterol levels. These may be checked every 5 years, or more frequently if you are over 69 years old.  Skin check.  Lung cancer screening. You may have this screening every year starting at age 69 if you have a 30-pack-year history of smoking and currently smoke or have quit within the past 15 years.  Fecal occult blood test (FOBT) of the stool. You may have this test every year starting at age 69.  Flexible sigmoidoscopy or colonoscopy. You may have a sigmoidoscopy every 5 years or a colonoscopy every 10 years starting at age 69.  Prostate cancer screening. Recommendations will vary depending on your family history and other risks.  Hepatitis C blood test.  Hepatitis B blood test.  Sexually transmitted disease (STD) testing.  Diabetes screening. This is done by checking your blood sugar (glucose) after you have not eaten for a while (fasting). You may have this done every 1-3 years.  Abdominal aortic aneurysm (AAA) screening. You may need this if you are a current or former smoker.  Osteoporosis. You may be screened starting at age 69 if you are at high risk. Talk with your health care provider about your test results, treatment options, and if necessary, the need for more tests. Vaccines  Your health care provider may recommend certain vaccines, such as:  Influenza vaccine. This is recommended every year.  Tetanus, diphtheria, and acellular pertussis (Tdap, Td) vaccine. You may need a Td booster every 10 years.  Zoster vaccine. You may need this after age  69.  Pneumococcal 13-valent conjugate (PCV13) vaccine. One dose is recommended after age 69.  Pneumococcal polysaccharide (PPSV23) vaccine. One dose is recommended after age 44. Talk to your health care provider about which screenings and vaccines you need and how often you need them. This  information is not intended to replace advice given to you by your health care provider. Make sure you discuss any questions you have with your health care provider. Document Released: 04/29/2015 Document Revised: 12/21/2015 Document Reviewed: 02/01/2015 Elsevier Interactive Patient Education  2017 [place] Prevention in the Home Falls can cause injuries. They can happen to people of all ages. There are many things you can do to make your home safe and to help prevent falls. What can I do on the outside of my home?  Regularly fix the edges of walkways and driveways and fix any cracks.  Remove anything that might make you trip as you walk through a door, such as a raised step or threshold.  Trim any bushes or trees on the path to your home.  Use bright outdoor lighting.  Clear any walking paths of anything that might make someone trip, such as rocks or tools.  Regularly check to see if handrails are loose or broken. Make sure that both sides of any steps have handrails.  Any raised decks and porches should have guardrails on the edges.  Have any leaves, snow, or ice cleared regularly.  Use sand or salt on walking paths during winter.  Clean up any spills in your garage right away. This includes oil or grease spills. What can I do in the bathroom?  Use night lights.  Install grab bars by the toilet and in the tub and shower. Do not use towel bars as grab bars.  Use non-skid mats or decals in the tub or shower.  If you need to sit down in the shower, use a plastic, non-slip stool.  Keep the floor dry. Clean up any water that spills on the floor as soon as it happens.  Remove soap buildup in the tub or shower regularly.  Attach bath mats securely with double-sided non-slip rug tape.  Do not have throw rugs and other things on the floor that can make you trip. What can I do in the bedroom?  Use night lights.  Make sure that you have a light by your bed that is  easy to reach.  Do not use any sheets or blankets that are too big for your bed. They should not hang down onto the floor.  Have a firm chair that has side arms. You can use this for support while you get dressed.  Do not have throw rugs and other things on the floor that can make you trip. What can I do in the kitchen?  Clean up any spills right away.  Avoid walking on wet floors.  Keep items that you use a lot in easy-to-reach places.  If you need to reach something above you, use a strong step stool that has a grab bar.  Keep electrical cords out of the way.  Do not use floor polish or wax that makes floors slippery. If you must use wax, use non-skid floor wax.  Do not have throw rugs and other things on the floor that can make you trip. What can I do with my stairs?  Do not leave any items on the stairs.  Make sure that there are handrails on both sides of the stairs and  use them. Fix handrails that are broken or loose. Make sure that handrails are as long as the stairways.  Check any carpeting to make sure that it is firmly attached to the stairs. Fix any carpet that is loose or worn.  Avoid having throw rugs at the top or bottom of the stairs. If you do have throw rugs, attach them to the floor with carpet tape.  Make sure that you have a light switch at the top of the stairs and the bottom of the stairs. If you do not have them, ask someone to add them for you. What else can I do to help prevent falls?  Wear shoes that:  Do not have high heels.  Have rubber bottoms.  Are comfortable and fit you well.  Are closed at the toe. Do not wear sandals.  If you use a stepladder:  Make sure that it is fully opened. Do not climb a closed stepladder.  Make sure that both sides of the stepladder are locked into place.  Ask someone to hold it for you, if possible.  Clearly mark and make sure that you can see:  Any grab bars or handrails.  First and last  steps.  Where the edge of each step is.  Use tools that help you move around (mobility aids) if they are needed. These include:  Canes.  Walkers.  Scooters.  Crutches.  Turn on the lights when you go into a dark area. Replace any light bulbs as soon as they burn out.  Set up your furniture so you have a clear path. Avoid moving your furniture around.  If any of your floors are uneven, fix them.  If there are any pets around you, be aware of where they are.  Review your medicines with your doctor. Some medicines can make you feel dizzy. This can increase your chance of falling. Ask your doctor what other things that you can do to help prevent falls. This information is not intended to replace advice given to you by your health care provider. Make sure you discuss any questions you have with your health care provider. Document Released: 01/27/2009 Document Revised: 09/08/2015 Document Reviewed: 05/07/2014 Elsevier Interactive Patient Education  2017 [employer].

## 2020-02-04 NOTE — Progress Notes (Signed)
Subjective:   Mark Sandoval is a 69 y.o. male who presents for Medicare Annual/Subsequent preventive examination.  Review of Systems     Cardiac Risk Factors include: advanced age (>77men, >45 women);hypertension;dyslipidemia     Objective:    Today's Vitals   02/04/20 0813 02/04/20 0814  BP: 127/84   Weight: 191 lb (86.6 kg)   Height: 6\' 1"  (1.854 m)   PainSc:  0-No pain   Body mass index is 25.2 kg/m.  Advanced Directives 02/04/2020 04/10/2018 01/30/2018 05/25/2016 05/18/2016 05/10/2016 03/29/2016  Does Patient Have a Medical Advance Directive? No Yes Yes Yes Yes Yes Yes  Type of Advance Directive - [place];Living will - (No Data) (No Data) (No Data) [place];Living will  Does patient want to make changes to medical advance directive? - No - Patient declined - - - No - Patient declined -  Copy of [place] in Chart? - No - copy requested - - - - No - copy requested  Would patient like information on creating a medical advance directive? Yes (ED - Information included in AVS) - - - - - -    Current Medications (verified) Outpatient Encounter Medications as of 02/04/2020  Medication Sig  . amLODipine (NORVASC) 5 MG tablet Take 1 tablet (5 mg total) by mouth daily.  . Multiple Vitamin (MULTIVITAMIN) tablet Take 1 tablet by mouth daily.   No facility-administered encounter medications on file as of 02/04/2020.    Allergies (verified) Compazine, Other, and Daucus carota   History: Past Medical History:  Diagnosis Date  . Dyslipidemia   . GERD (gastroesophageal reflux disease)   . History of seasonal allergies   . Hypertension   . Leukopenia   . Lump or mass in breast    benign  . Thrombocytopenia ([place])    Past Surgical History:  Procedure Laterality Date  . athroscopy of left knee  2002  . CHOLECYSTECTOMY  2007  . COLONOSCOPY N/A 03/29/2016   Procedure: COLONOSCOPY;  Surgeon: Rogene Houston, MD;   [facility];  Service: Endoscopy;  Laterality: N/A;  1030  . removal of benign left breast lump  09/06/07   Family History  Problem Relation Age of Onset  . Cancer Mother        hematologic cancer   . Hypertension Mother   . Alcohol abuse Father    Social History   Socioeconomic History  . Marital status: Married    Spouse name: Not on file  . Number of children: 4  . Years of education: DDS  . Highest education level: Not on file  Occupational History  . Occupation: Orthoptist: SELF-EMPLOYED    Comment: retired   Tobacco Use  . Smoking status: Never Smoker  . Smokeless tobacco: Never Used  Substance and Sexual Activity  . Alcohol use: Yes    Alcohol/week: 0.0 standard drinks    Comment: occasional  . Drug use: No  . Sexual activity: Yes  Other Topics Concern  . Not on file  Social History Narrative  . Not on file   Social Determinants of Health   Financial Resource Strain: Low Risk   . Difficulty of Paying Living Expenses: Not hard at all  Food Insecurity: No Food Insecurity  . Worried About Charity fundraiser in the Last Year: Never true  . Ran Out of Food in the Last Year: Never true  Transportation Needs: No Transportation Needs  .  Lack of Transportation (Medical): No  . Lack of Transportation (Non-Medical): No  Physical Activity: Sufficiently Active  . Days of Exercise per Week: 4 days  . Minutes of Exercise per Session: 60 min  Stress: No Stress Concern Present  . Feeling of Stress : Not at all  Social Connections: Moderately Integrated  . Frequency of Communication with Friends and Family: More than three times a week  . Frequency of Social Gatherings with Friends and Family: More than three times a week  . Attends Religious Services: More than 4 times per year  . Active Member of Clubs or Organizations: No  . Attends Archivist Meetings: Never  . Marital Status: Married    Tobacco Counseling Counseling given: Not  Answered   Clinical Intake:  Pre-visit preparation completed: No  Pain : No/denies pain Pain Score: 0-No pain     Nutritional Status: BMI 25 -29 Overweight Diabetes: No  How often do you need to have someone help you when you read instructions, pamphlets, or other written materials from your doctor or pharmacy?: 1 - Never What is the last grade level you completed in school?: college  Diabetic? No      Information entered by :: brand Durk Carmen lpn   Activities of Daily Living In your present state of health, do you have any difficulty performing the following activities: 02/04/2020  Hearing? N  Vision? N  Difficulty concentrating or making decisions? N  Walking or climbing stairs? N  Dressing or bathing? N  Doing errands, shopping? N  Preparing Food and eating ? N  Using the Toilet? N  In the past six months, have you accidently leaked urine? N  Do you have problems with loss of bowel control? N  Managing your Medications? N  Managing your Finances? N  Housekeeping or managing your Housekeeping? N  Some recent data might be hidden    Patient Care Team: Fayrene Helper, MD as PCP - General  Indicate any recent Medical Services you may have received from other than [facility] providers in the past year (date may be approximate).     Assessment:   This is a routine wellness examination for [place].  Hearing/Vision screen No exam data present  Dietary issues and exercise activities discussed: Current Exercise Habits: Home exercise routine (very active, plays alot of golf), Time (Minutes): 60, Frequency (Times/Week): 4, Weekly Exercise (Minutes/Week): 240, Intensity: Moderate  Goals    .  Follow up with Primary Care Provider      Increase yearly visits to bi-yearly per pt     .  Increase physical activity (pt-stated)      Retired and currently gets very little physical activity. Discussed a goal of walking for 20 minutes for 3 days a week to start    .  Prevent  falls      Needs to practice chair exercises to strengthen legs and keep them strong and reduce likelyhood of falls       Depression Screen PHQ 2/9 Scores 02/04/2020 02/04/2020 07/02/2019 02/03/2019 02/27/2018 01/30/2018 07/31/2017  PHQ - 2 Score 0 0 0 0 0 0 0  PHQ- 9 Score - - - - - - -    Fall Risk Fall Risk  02/04/2020 02/03/2020 10/15/2019 09/23/2019 07/02/2019  Falls in the past year? 0 0 0 0 0  Number falls in past yr: 0 0 0 0 0  Injury with Fall? 0 0 0 0 0  Follow up - - - - Falls evaluation  completed     Any stairs in or around the home? Yes  If so, are there any without handrails? No  Home free of loose throw rugs in walkways, pet beds, electrical cords, etc? Yes  Adequate lighting in your home to reduce risk of falls? Yes   ASSISTIVE DEVICES UTILIZED TO PREVENT FALLS:  Life alert? No  Use of a cane, walker or w/c? No  Grab bars in the bathroom? No  Shower chair or bench in shower? No  Elevated toilet seat or a handicapped toilet? No   TIMED UP AND GO:  Was the test performed? No .       Cognitive Function:     6CIT Screen 02/04/2020 02/04/2020 02/03/2019 01/30/2018  What Year? 0 points 0 points 0 points 0 points  What month? 0 points 0 points 0 points 0 points  What time? 0 points 0 points 0 points 0 points  Count back from 20 0 points - 0 points 0 points  Months in reverse 0 points - 0 points 2 points  Repeat phrase 0 points - 0 points 0 points  Total Score 0 - 0 2    Immunizations Immunization History  Administered Date(s) Administered  . Fluad Quad(high Dose 65+) 12/15/2018, 02/03/2020  . H1N1 02/06/2008  . Influenza Split 02/02/2011, 02/15/2012  . Influenza Whole 01/17/2007, 01/13/2010  . Influenza, High Dose Seasonal PF 01/10/2018  . Influenza,inj,Quad PF,6+ Mos 01/15/2014, 02/02/2015, 01/05/2016, 01/01/2017  . PFIZER SARS-COV-2 Vaccination 05/21/2019, 06/15/2019, 01/04/2020  . Pneumococcal Conjugate-13 01/26/2016  . Pneumococcal Polysaccharide-23  07/31/2017  . Td 05/03/2006  . Tdap 01/01/2017  . Zoster 02/02/2011    TDAP status: Up to date Flu Vaccine status: Up to date Pneumococcal vaccine status: Up to date Covid-19 vaccine status: Completed vaccines  Qualifies for Shingles Vaccine? Yes   Zostavax completed Yes   Shingrix Completed?: No.    Education has been provided regarding the importance of this vaccine. Patient has been advised to call insurance company to determine out of pocket expense if they have not yet received this vaccine. Advised may also receive vaccine at local pharmacy or Health Dept. Verbalized acceptance and understanding.  Screening Tests Health Maintenance  Topic Date Due  . COLONOSCOPY  03/29/2026  . TETANUS/TDAP  01/02/2027  . INFLUENZA VACCINE  Completed  . COVID-19 Vaccine  Completed  . Hepatitis C Screening  Completed  . PNA vac Low Risk Adult  Completed    Health Maintenance  There are no preventive care reminders to display for this patient.  Colorectal cancer screening: Completed yes. Repeat every 2027 years  Lung Cancer Screening: (Low Dose CT Chest recommended if Age 85-80 years, 30 pack-year currently smoking OR have quit w/in 15years.) does not qualify.   Lung Cancer Screening Referral: does not qualify  Additional Screening:  Hepatitis C Screening: does qualify; Completed once  Vision Screening: Recommended annual ophthalmology exams for early detection of glaucoma and other disorders of the eye. Is the patient up to date with their annual eye exam?  Yes  Who is the provider or what is the name of the office in which the patient attends annual eye exams? At [facility]  If pt is not established with a provider, would they like to be referred to a provider to establish care? No .   Dental Screening: Recommended annual dental exams for proper oral hygiene  Community Resource Referral / Chronic Care Management: CRR required this visit?  No   CCM required this visit?  No        Plan:     I have personally reviewed and noted the following in the patient's chart:   . Medical and social history . Use of alcohol, tobacco or illicit drugs  . Current medications and supplements . Functional ability and status . Nutritional status . Physical activity . Advanced directives . List of other physicians . Hospitalizations, surgeries, and ER visits in previous 12 months . Vitals . Screenings to include cognitive, depression, and falls . Referrals and appointments  In addition, I have reviewed and discussed with patient certain preventive protocols, quality metrics, and best practice recommendations. A written personalized care plan for preventive services as well as general preventive health recommendations were provided to patient.     Kate Sable, LPN, LPN   [id number]   Nurse Notes: Visit performed by telephone, patient at home and provider Tula Nakayama MD) in office. Time spent with patient, 30 mins.

## 2020-02-09 ENCOUNTER — Encounter: Payer: Self-pay | Admitting: Family Medicine

## 2020-02-15 ENCOUNTER — Encounter: Payer: Self-pay | Admitting: Family Medicine

## 2020-02-16 DIAGNOSIS — Z1211 Encounter for screening for malignant neoplasm of colon: Secondary | ICD-10-CM | POA: Diagnosis not present

## 2020-02-16 LAB — COLOGUARD: Cologuard: NEGATIVE

## 2020-02-17 ENCOUNTER — Encounter: Payer: Self-pay | Admitting: General Surgery

## 2020-02-18 ENCOUNTER — Other Ambulatory Visit: Payer: Self-pay

## 2020-02-18 ENCOUNTER — Ambulatory Visit (INDEPENDENT_AMBULATORY_CARE_PROVIDER_SITE_OTHER): Payer: Medicare Other | Admitting: General Surgery

## 2020-02-18 ENCOUNTER — Encounter: Payer: Self-pay | Admitting: General Surgery

## 2020-02-18 VITALS — BP 129/88 | HR 80 | Temp 98.1°F | Resp 12 | Ht 73.0 in | Wt 191.0 lb

## 2020-02-18 DIAGNOSIS — W57XXXA Bitten or stung by nonvenomous insect and other nonvenomous arthropods, initial encounter: Secondary | ICD-10-CM | POA: Diagnosis not present

## 2020-02-18 DIAGNOSIS — S30861A Insect bite (nonvenomous) of abdominal wall, initial encounter: Secondary | ICD-10-CM | POA: Diagnosis not present

## 2020-02-18 NOTE — Progress Notes (Signed)
Mark Sandoval; [id number]; 1950/12/21   HPI Patient is a 69 year old black male who was referred to my care by Dr. Moshe Cipro for evaluation and treatment of a skin nodule of the abdominal wall.  He sustained a tick bite back in May 2021.  He was checked for Lyme's disease.  He was negative.  Since that time, he has had varying levels of swelling at the bite site.  He had another one removed and is primary care office.  After he scheduled his office visit, he states the wound has decreased.  No purulent drainage is noted.  He denies any fevers. Past Medical History:  Diagnosis Date  . Dyslipidemia   . GERD (gastroesophageal reflux disease)   . History of seasonal allergies   . Hypertension   . Leukopenia   . Lump or mass in breast    benign  . Other spondylosis with radiculopathy, cervical region   . Thrombocytopenia ([place])     Past Surgical History:  Procedure Laterality Date  . athroscopy of left knee  2002  . CHOLECYSTECTOMY  2007  . COLONOSCOPY N/A 03/29/2016   Procedure: COLONOSCOPY;  Surgeon: Rogene Houston, MD;  [facility];  Service: Endoscopy;  Laterality: N/A;  1030  . removal of benign left breast lump  09/06/07    Family History  Problem Relation Age of Onset  . Cancer Mother        hematologic cancer   . Hypertension Mother   . Alcohol abuse Father     Current Outpatient Medications on File Prior to Visit  Medication Sig Dispense Refill  . amLODipine (NORVASC) 5 MG tablet Take 1 tablet (5 mg total) by mouth daily. 90 tablet 3  . Multiple Vitamin (MULTIVITAMIN) tablet Take 1 tablet by mouth daily.     No current facility-administered medications on file prior to visit.    Allergies  Allergen Reactions  . Compazine Other (See Comments)    tongue movement disorders/MD in ER stated he would have expired  . Other Swelling  . Daucus Carota Other (See Comments)    Mouth swollen    Social History   Substance and Sexual Activity  Alcohol Use Yes   . Alcohol/week: 0.0 standard drinks   Comment: occasional    Social History   Tobacco Use  Smoking Status Never Smoker  Smokeless Tobacco Never Used    Review of Systems  Constitutional: Negative.   HENT: Negative.   Eyes: Negative.   Respiratory: Negative.   Cardiovascular: Negative.   Gastrointestinal: Negative.   Genitourinary: Negative.   Musculoskeletal: Negative.   Skin: Negative.   Neurological: Negative.   Endo/Heme/Allergies: Negative.   Psychiatric/Behavioral: Negative.     Objective   Vitals:   02/18/20 0847  BP: 129/88  Pulse: 80  Resp: 12  Temp: 98.1 F (36.7 C)  SpO2: 98%    Physical Exam Vitals reviewed.  Constitutional:      Appearance: Normal appearance. He is normal weight. He is not ill-appearing.  HENT:     Head: Normocephalic and atraumatic.  Cardiovascular:     Rate and Rhythm: Normal rate and regular rhythm.     Heart sounds: Normal heart sounds. No murmur heard.  No friction rub. No gallop.   Pulmonary:     Effort: Pulmonary effort is normal. No respiratory distress.     Breath sounds: Normal breath sounds. No stridor. No wheezing, rhonchi or rales.  Skin:    General: Skin is warm and dry.  Comments: Small 2 to 3 mm slightly raised skin nodule present without drainage, erythema, or induration.  Appears to be resolving granuloma.  Neurological:     Mental Status: He is alert and oriented to person, place, and time.     Assessment  Abdominal wall tick bite with resolving granuloma Plan   No need for surgical excision at this time.  I did tell the patient should the swelling or tenderness recur, please return to my office.  He understands and agrees.  Follow-up here as needed.

## 2020-03-16 ENCOUNTER — Ambulatory Visit: Payer: Medicare Other

## 2020-03-16 ENCOUNTER — Ambulatory Visit (INDEPENDENT_AMBULATORY_CARE_PROVIDER_SITE_OTHER): Payer: Medicare Other | Admitting: Orthopedic Surgery

## 2020-03-16 ENCOUNTER — Encounter: Payer: Self-pay | Admitting: Orthopedic Surgery

## 2020-03-16 ENCOUNTER — Other Ambulatory Visit: Payer: Self-pay

## 2020-03-16 VITALS — BP 144/96 | HR 77 | Ht 74.0 in | Wt 190.0 lb

## 2020-03-16 DIAGNOSIS — M25522 Pain in left elbow: Secondary | ICD-10-CM | POA: Diagnosis not present

## 2020-03-16 DIAGNOSIS — M7712 Lateral epicondylitis, left elbow: Secondary | ICD-10-CM | POA: Diagnosis not present

## 2020-03-16 NOTE — Patient Instructions (Addendum)
Continue ice lateral elbow 20 minutes 2-3 times a day  Recommend Advil or Aleve as tolerated 2 times a day  Start home exercise program performed for 6 weeks  Tennis elbow brace 6 weeks and for any strenuous activity including golfing  Topical liniment such as Biofreeze Aspercreme BenGay are also advisable 1-2 times per day   Tennis Elbow  Tennis elbow (lateral epicondylitis) is inflammation of tendons in your outer forearm, near your elbow. Tendons are tissues that connect muscle to bone. When you have tennis elbow, inflammation affects the tendons that you use to bend your wrist and move your hand up. Inflammation occurs in the lower part of the upper arm bone (humerus), where the tendons connect to the bone (lateral epicondyle). Tennis elbow often affects people who play tennis, but anyone may get the condition from repeatedly extending the wrist or turning the forearm. What are the causes? This condition is usually caused by repeatedly extending the wrist, turning the forearm, and using the hands. It can result from sports or work that requires repetitive forearm movements. In some cases, it may be caused by a sudden injury. What increases the risk? You are more likely to develop tennis elbow if you play tennis or another racket sport. You also have a higher risk if you frequently use your hands for work. Besides people who play tennis, others at greater risk include:  Musicians.  Carpenters, painters, and plumbers.  Cooks.  Cashiers.  People who work in [employer].  Architect workers.  Butchers.  People who use computers. What are the signs or symptoms? Symptoms of this condition include:  Pain and tenderness in the forearm and the outer part of the elbow. Pain may be felt only when using the arm, or it may be there all the time.  A burning feeling that starts in the elbow and spreads down the forearm.  A weak grip in the hand. How is this diagnosed? This condition may  be diagnosed based on:  Your symptoms and medical history.  A physical exam.  X-rays.  MRI. How is this treated? Resting and icing your arm is often the first treatment. Your health care provider may also recommend:  Medicines to reduce pain and inflammation. These may be in the form of a pill, topical gels, or shots of a steroid medicine (cortisone).  An elbow strap to reduce stress on the area.  Physical therapy. This may include massage or exercises.  An elbow brace to restrict the movements that cause symptoms. If these treatments do not help relieve your symptoms, your health care provider may recommend surgery to remove damaged muscle and reattach healthy muscle to bone. Follow these instructions at home: Activity  Rest your elbow and wrist and avoid activities that cause symptoms, as told by your health care provider.  Do physical therapy exercises as instructed.  If you lift an object, lift it with your palm facing up. This reduces stress on your elbow. Lifestyle  If your tennis elbow is caused by sports, check your equipment and make sure that: ? You are using it correctly. ? It is the best fit for you.  If your tennis elbow is caused by work or computer use, take frequent breaks to stretch your arm. Talk with your manager about ways to manage your condition at work. If you have a brace:  Wear the brace or strap as told by your health care provider. Remove it only as told by your health care provider.  Loosen  the brace if your fingers tingle, become numb, or turn cold and blue.  Keep the brace clean.  If the brace is not waterproof, ask if you may remove it for bathing. If you must keep the brace on while bathing: ? Do not let it get wet. ? Cover it with a watertight covering when you take a bath or a shower. General instructions   If directed, put ice on the painful area: ? Put ice in a plastic bag. ? Place a towel between your skin and the bag. ? Leave  the ice on for 20 minutes, 2-3 times a day.  Take over-the-counter and prescription medicines only as told by your health care provider.  Keep all follow-up visits as told by your health care provider. This is important. Contact a health care provider if:  You have pain that gets worse or does not get better with treatment.  You have numbness or weakness in your forearm, hand, or fingers. Summary  Tennis elbow (lateral epicondylitis) is inflammation of tendons in your outer forearm, near your elbow.  Common symptoms include pain and tenderness in your forearm and the outer part of your elbow.  This condition is usually caused by repeatedly extending your wrist, turning your forearm, and using your hands.  The first treatment is often resting and icing your arm to relieve symptoms. Further treatment may include taking medicine, getting physical therapy, wearing a brace or strap, or having surgery. This information is not intended to replace advice given to you by your health care provider. Make sure you discuss any questions you have with your health care provider. Document Revised: 12/27/2017 Document Reviewed: 01/15/2017 Elsevier Patient Education  [place].

## 2020-03-16 NOTE — Progress Notes (Signed)
Chief Complaint  Patient presents with  . 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  He did use some ice TENS unit tennis elbow brace but he says the pain is lingered longer than he thought he wanted it checked out  Review of systems denies any numbness tingling rash fever chills  Past Medical History:  Diagnosis Date  . Dyslipidemia   . GERD (gastroesophageal reflux disease)   . History of seasonal allergies   . Hypertension   . Leukopenia   . Lump or mass in breast    benign  . Other spondylosis with radiculopathy, cervical region   . Thrombocytopenia (HCC)     BP (!) 144/96   Pulse 77   Ht 6\' 2"  (1.88 m)   Wt 190 lb (86.2 kg)   BMI 24.39 kg/m   Physical Exam Constitutional:      General: He is not in acute distress.    Appearance: He is well-developed.  Cardiovascular:     Comments: No peripheral edema Musculoskeletal:     Left elbow: No swelling, deformity, effusion or lacerations. Normal range of motion. Tenderness present.     Comments: Lateral tenderness over the elbow MCL stable   Skin:    General: Skin is warm and dry.     Capillary Refill: Capillary refill takes less than 2 seconds.  Neurological:     Mental Status: He is alert and oriented to person, place, and time.     Sensory: No sensory deficit.     Coordination: Coordination normal.     Gait: Gait normal.  Psychiatric:        Mood and Affect: Mood normal.        Behavior: Behavior normal.        Thought Content: Thought content normal.    Xrays : See dictated report x-ray looks normal except for a sclerotic lesion in the lateral distal humeral epicondyle which will require 42-month repeat x-ray   A/P :  Encounter Diagnoses  Name Primary?  . Pain in left elbow Yes  . Left tennis elbow     Recommend home exercise program Offered cortisone  injection Continue tennis elbow bracing Topical medications liniments as tolerated Oral anti-inflammatories such as Advil or Aleve if tolerated   Procedure note injection for left tennis elbow  Diagnosis left tennis elbow  Anesthesia ethyl chloride was used Alcohol use is clean the skin  After we obtained verbal consent and timeout a 25-gauge needle was used to inject 40 mg of Depo-Medrol and 3 cc of 1% lidocaine just distal to the insertion of the ECRB  There were no complications and a sterile bandage was applied.

## 2020-03-23 ENCOUNTER — Ambulatory Visit: Payer: Medicare Other | Admitting: Family Medicine

## 2020-03-28 ENCOUNTER — Ambulatory Visit: Payer: Medicare Other | Admitting: Orthopedic Surgery

## 2020-04-07 ENCOUNTER — Other Ambulatory Visit: Payer: Medicare Other

## 2020-04-07 DIAGNOSIS — Z20822 Contact with and (suspected) exposure to covid-19: Secondary | ICD-10-CM | POA: Diagnosis not present

## 2020-04-09 LAB — NOVEL CORONAVIRUS, NAA: SARS-CoV-2, NAA: NOT DETECTED

## 2020-04-09 LAB — SARS-COV-2, NAA 2 DAY TAT

## 2020-04-22 ENCOUNTER — Other Ambulatory Visit: Payer: Self-pay

## 2020-04-22 ENCOUNTER — Other Ambulatory Visit: Payer: Medicare Other

## 2020-04-22 DIAGNOSIS — R142 Eructation: Secondary | ICD-10-CM | POA: Diagnosis not present

## 2020-04-22 DIAGNOSIS — R1084 Generalized abdominal pain: Secondary | ICD-10-CM | POA: Diagnosis not present

## 2020-04-22 DIAGNOSIS — Z20822 Contact with and (suspected) exposure to covid-19: Secondary | ICD-10-CM

## 2020-04-25 LAB — NOVEL CORONAVIRUS, NAA: SARS-CoV-2, NAA: NOT DETECTED

## 2020-05-16 ENCOUNTER — Encounter: Payer: Self-pay | Admitting: Family Medicine

## 2020-05-26 DIAGNOSIS — N401 Enlarged prostate with lower urinary tract symptoms: Secondary | ICD-10-CM | POA: Diagnosis not present

## 2020-06-02 DIAGNOSIS — N401 Enlarged prostate with lower urinary tract symptoms: Secondary | ICD-10-CM | POA: Diagnosis not present

## 2020-06-02 DIAGNOSIS — R350 Frequency of micturition: Secondary | ICD-10-CM | POA: Diagnosis not present

## 2020-06-09 ENCOUNTER — Encounter: Payer: Self-pay | Admitting: Family Medicine

## 2020-06-15 ENCOUNTER — Ambulatory Visit: Payer: Medicare Other

## 2020-06-15 ENCOUNTER — Encounter: Payer: Self-pay | Admitting: Orthopedic Surgery

## 2020-06-15 ENCOUNTER — Ambulatory Visit (INDEPENDENT_AMBULATORY_CARE_PROVIDER_SITE_OTHER): Payer: Medicare Other | Admitting: Orthopedic Surgery

## 2020-06-15 ENCOUNTER — Other Ambulatory Visit: Payer: Self-pay

## 2020-06-15 VITALS — BP 146/88 | HR 75 | Ht 73.0 in | Wt 192.0 lb

## 2020-06-15 DIAGNOSIS — M25522 Pain in left elbow: Secondary | ICD-10-CM

## 2020-06-15 NOTE — Progress Notes (Signed)
Chief Complaint  Patient presents with  . Elbow Pain    Lt elbow follow up   follow-up left elbow for bone lesion repeat x-ray 3 months after found in the process of a work-up for tennis elbow.  Patient not having any pain no weakness no malaise no unexplained weight loss  X-rays show no change in the lesion  X-ray report see dictation  Follow-up in a year for repeat x-ray

## 2020-06-16 ENCOUNTER — Ambulatory Visit (INDEPENDENT_AMBULATORY_CARE_PROVIDER_SITE_OTHER): Payer: Medicare Other | Admitting: Nurse Practitioner

## 2020-06-16 ENCOUNTER — Encounter: Payer: Self-pay | Admitting: Nurse Practitioner

## 2020-06-16 DIAGNOSIS — N138 Other obstructive and reflux uropathy: Secondary | ICD-10-CM | POA: Diagnosis not present

## 2020-06-16 DIAGNOSIS — H9202 Otalgia, left ear: Secondary | ICD-10-CM | POA: Insufficient documentation

## 2020-06-16 DIAGNOSIS — R972 Elevated prostate specific antigen [PSA]: Secondary | ICD-10-CM | POA: Diagnosis not present

## 2020-06-16 DIAGNOSIS — Z87898 Personal history of other specified conditions: Secondary | ICD-10-CM | POA: Diagnosis not present

## 2020-06-16 DIAGNOSIS — R3912 Poor urinary stream: Secondary | ICD-10-CM | POA: Diagnosis not present

## 2020-06-16 DIAGNOSIS — N401 Enlarged prostate with lower urinary tract symptoms: Secondary | ICD-10-CM | POA: Diagnosis not present

## 2020-06-16 MED ORDER — FLUTICASONE PROPIONATE 50 MCG/ACT NA SUSP: 2.0000 | Freq: Every day | NASAL | 6 refills | Status: DC

## 2020-06-16 NOTE — Progress Notes (Signed)
Acute Office Visit  Subjective:    Patient ID: Mark Sandoval, male    DOB: 03/16/1951, 70 y.o.   MRN: [id number]  Chief Complaint  Patient presents with  . Ear Pain    L ear pain intermittently x 2 months.     HPI Patient is in today for left ear pain.  He states that the pain lasts all day. No detectable changes in hearing. He denies sneezing, congestion, or sinus pressure.  Past Medical History:  Diagnosis Date  . Dyslipidemia   . GERD (gastroesophageal reflux disease)   . History of seasonal allergies   . Hypertension   . Leukopenia   . Lump or mass in breast    benign  . Other spondylosis with radiculopathy, cervical region   . Thrombocytopenia ([place])     Past Surgical History:  Procedure Laterality Date  . athroscopy of left knee  2002  . CHOLECYSTECTOMY  2007  . COLONOSCOPY N/A 03/29/2016   Procedure: COLONOSCOPY;  Surgeon: Rogene Houston, MD;  Location: [facility];  Service: Endoscopy;  Laterality: N/A;  1030  . removal of benign left breast lump  09/06/07    Family History  Problem Relation Age of Onset  . Cancer Mother        hematologic cancer   . Hypertension Mother   . Alcohol abuse Father     Social History   Socioeconomic History  . Marital status: Married    Spouse name: Not on file  . Number of children: 4  . Years of education: DDS  . Highest education level: Not on file  Occupational History  . Occupation: Orthoptist: SELF-EMPLOYED    Comment: retired   Tobacco Use  . Smoking status: Never Smoker  . Smokeless tobacco: Never Used  Substance and Sexual Activity  . Alcohol use: Yes    Alcohol/week: 0.0 standard drinks    Comment: occasional  . Drug use: No  . Sexual activity: Yes  Other Topics Concern  . Not on file  Social History Narrative  . Not on file   Social Determinants of Health   Financial Resource Strain: Low Risk   . Difficulty of Paying Living Expenses: Not hard at all  Food Insecurity: No Food  Insecurity  . Worried About Charity fundraiser in the Last Year: Never true  . Ran Out of Food in the Last Year: Never true  Transportation Needs: No Transportation Needs  . Lack of Transportation (Medical): No  . Lack of Transportation (Non-Medical): No  Physical Activity: Sufficiently Active  . Days of Exercise per Week: 4 days  . Minutes of Exercise per Session: 60 min  Stress: No Stress Concern Present  . Feeling of Stress : Not at all  Social Connections: Moderately Integrated  . Frequency of Communication with Friends and Family: More than three times a week  . Frequency of Social Gatherings with Friends and Family: More than three times a week  . Attends Religious Services: More than 4 times per year  . Active Member of Clubs or Organizations: No  . Attends Archivist Meetings: Never  . Marital Status: Married  Human resources officer Violence: Not on file    Outpatient Medications Prior to Visit  Medication Sig Dispense Refill  . amLODipine (NORVASC) 5 MG tablet Take 1 tablet (5 mg total) by mouth daily. 90 tablet 3  . Multiple Vitamin (MULTIVITAMIN) tablet Take 1 tablet by mouth daily.  No facility-administered medications prior to visit.    Allergies  Allergen Reactions  . Compazine Other (See Comments)    tongue movement disorders/MD in ER stated he would have expired  . Other Swelling  . Daucus Carota Other (See Comments)    Mouth swollen    Review of Systems  Constitutional: Negative.   HENT: Positive for ear pain. Negative for congestion, ear discharge, facial swelling, hearing loss, postnasal drip, rhinorrhea, sinus pressure, sinus pain and sore throat.   Respiratory: Negative.   Cardiovascular: Negative.        Objective:    Physical Exam Constitutional:      Appearance: Normal appearance.  HENT:     Head: Normocephalic and atraumatic.     Right Ear: Tympanic membrane, ear canal and external ear normal.     Left Ear: Tympanic membrane, ear  canal and external ear normal.     Nose: Nose normal.  Neurological:     Mental Status: He is alert.     BP 130/78   Pulse 96   Temp (!) 96 F (35.6 C)   Resp 18   Ht 6\' 1"  (1.854 m)   Wt 196 lb (88.9 kg)   SpO2 97%   BMI 25.86 kg/m  Wt Readings from Last 3 Encounters:  06/16/20 196 lb (88.9 kg)  06/15/20 192 lb (87.1 kg)  03/16/20 190 lb (86.2 kg)    There are no preventive care reminders to display for this patient.  There are no preventive care reminders to display for this patient.   Lab Results  Component Value Date   TSH 1.84 09/24/2019   Lab Results  Component Value Date   WBC 4.5 09/24/2019   HGB 13.1 (L) 09/24/2019   HCT 41.5 09/24/2019   MCV 71.9 (L) 09/24/2019   PLT 140 09/24/2019   Lab Results  Component Value Date   NA 140 09/24/2019   K 4.3 09/24/2019   CO2 28 09/24/2019   GLUCOSE 84 09/24/2019   BUN 15 09/24/2019   CREATININE 1.36 (H) 09/24/2019   BILITOT 0.6 09/24/2019   ALKPHOS 98 06/27/2017   AST 21 09/24/2019   ALT 19 09/24/2019   PROT 6.7 09/24/2019   ALBUMIN 4.0 06/27/2017   CALCIUM 8.9 09/24/2019   ANIONGAP 10 07/03/2017   Lab Results  Component Value Date   CHOL 164 09/24/2019   Lab Results  Component Value Date   HDL 53 09/24/2019   Lab Results  Component Value Date   LDLCALC 97 09/24/2019   Lab Results  Component Value Date   TRIG 58 09/24/2019   Lab Results  Component Value Date   CHOLHDL 3.1 09/24/2019   Lab Results  Component Value Date   HGBA1C 5.6 08/23/2015       Assessment & Plan:   Problem List Items Addressed This Visit      Other   Left ear pain    -ongoing x 2-3 months -nothing apparent on physical exam -Rx. Flonase; will treat possible Eustachian tube disorder -referral to ENT       Relevant Orders   Ambulatory referral to ENT       Meds ordered this encounter  Medications  . fluticasone (FLONASE) 50 MCG/ACT nasal spray    Sig: Place 2 sprays into both nostrils daily.     Dispense:  16 g    Refill:  Alger, NP

## 2020-06-16 NOTE — Patient Instructions (Signed)
You were prescribed flonase for your ear pain. If Eustachian tube disorder is the cause of the pain, this should help. Flonase is a steroid, so the effects won't be immediate, but if it is going to help, you should notice a difference in a few days. You can try OTC tylenol or NSAIDS as well.  I sent a referral to ENT, so if the flonase or NSAIDs don't help, the specialist will be able to diagnose the problem.

## 2020-06-16 NOTE — Assessment & Plan Note (Signed)
-  ongoing x 2-3 months -nothing apparent on physical exam -Rx. Flonase; will treat possible Eustachian tube disorder -referral to ENT

## 2020-07-05 ENCOUNTER — Ambulatory Visit (INDEPENDENT_AMBULATORY_CARE_PROVIDER_SITE_OTHER): Payer: Medicare Other | Admitting: Otolaryngology

## 2020-07-18 ENCOUNTER — Ambulatory Visit: Payer: Medicare Other

## 2020-07-19 DIAGNOSIS — Z23 Encounter for immunization: Secondary | ICD-10-CM | POA: Diagnosis not present

## 2020-07-21 ENCOUNTER — Other Ambulatory Visit: Payer: Self-pay

## 2020-07-21 ENCOUNTER — Ambulatory Visit (INDEPENDENT_AMBULATORY_CARE_PROVIDER_SITE_OTHER): Payer: Medicare Other | Admitting: Otolaryngology

## 2020-07-21 VITALS — Temp 97.3°F

## 2020-07-21 DIAGNOSIS — H9202 Otalgia, left ear: Secondary | ICD-10-CM | POA: Diagnosis not present

## 2020-07-21 NOTE — Progress Notes (Signed)
HPI: Mark Sandoval is a 70 y.o. male who presents is referred by Demetrius Revel, NP for evaluation of ear pain.  This initially began about 6 months ago and was much worse but is doing better now.  The pain began in the left ear.  He has not noted any hearing loss.  He has had no drainage from the ears..  Past Medical History:  Diagnosis Date  . Dyslipidemia   . GERD (gastroesophageal reflux disease)   . History of seasonal allergies   . Hypertension   . Leukopenia   . Lump or mass in breast    benign  . Other spondylosis with radiculopathy, cervical region   . Thrombocytopenia ([place])    Past Surgical History:  Procedure Laterality Date  . athroscopy of left knee  2002  . CHOLECYSTECTOMY  2007  . COLONOSCOPY N/A 03/29/2016   Procedure: COLONOSCOPY;  Surgeon: Rogene Houston, MD;  [facility];  Service: Endoscopy;  Laterality: N/A;  1030  . removal of benign left breast lump  09/06/07   Social History   Socioeconomic History  . Marital status: Married    Spouse name: Not on file  . Number of children: 4  . Years of education: DDS  . Highest education level: Not on file  Occupational History  . Occupation: Orthoptist: SELF-EMPLOYED    Comment: retired   Tobacco Use  . Smoking status: Never Smoker  . Smokeless tobacco: Never Used  Substance and Sexual Activity  . Alcohol use: Yes    Alcohol/week: 0.0 standard drinks    Comment: occasional  . Drug use: No  . Sexual activity: Yes  Other Topics Concern  . Not on file  Social History Narrative  . Not on file   Social Determinants of Health   Financial Resource Strain: Low Risk   . Difficulty of Paying Living Expenses: Not hard at all  Food Insecurity: No Food Insecurity  . Worried About Charity fundraiser in the Last Year: Never true  . Ran Out of Food in the Last Year: Never true  Transportation Needs: No Transportation Needs  . Lack of Transportation (Medical): No  . Lack of Transportation  (Non-Medical): No  Physical Activity: Sufficiently Active  . Days of Exercise per Week: 4 days  . Minutes of Exercise per Session: 60 min  Stress: No Stress Concern Present  . Feeling of Stress : Not at all  Social Connections: Moderately Integrated  . Frequency of Communication with Friends and Family: More than three times a week  . Frequency of Social Gatherings with Friends and Family: More than three times a week  . Attends Religious Services: More than 4 times per year  . Active Member of Clubs or Organizations: No  . Attends Archivist Meetings: Never  . Marital Status: Married   Family History  Problem Relation Age of Onset  . Cancer Mother        hematologic cancer   . Hypertension Mother   . Alcohol abuse Father    Allergies  Allergen Reactions  . Compazine Other (See Comments)    tongue movement disorders/MD in ER stated he would have expired  . Other Swelling  . Daucus Carota Other (See Comments)    Mouth swollen   Prior to Admission medications   Medication Sig Start Date End Date Taking? Authorizing Provider  amLODipine (NORVASC) 5 MG tablet Take 1 tablet (5 mg total) by mouth daily.  08/26/19   Fayrene Helper, MD  fluticasone (FLONASE) 50 MCG/ACT nasal spray Place 2 sprays into both nostrils daily. 06/16/20   Noreene Larsson, NP  Multiple Vitamin (MULTIVITAMIN) tablet Take 1 tablet by mouth daily.    [provider]     Positive ROS: Otherwise negative  All other systems have been reviewed and were otherwise negative with the exception of those mentioned in the HPI and as above.  Physical Exam: Constitutional: Alert, well-appearing, no acute distress Ears: External ears without lesions or tenderness. Ear canals are clear bilaterally with minimal wax buildup that was nonobstructing.  On microscopic exam both ear canals and TMs are clear bilaterally.  On hearing screening with the 1024 tuning fork he hears well in both ears with no  discernible hearing loss. Nasal: External nose without lesions. Septum relatively midline.. Clear nasal passages. Oral: Lips and gums without lesions. Tongue and palate mucosa without lesions. Posterior oropharynx clear.  Tonsil regions appear benign bilaterally. Neck: No palpable adenopathy or masses.  No erythema or swelling around the ears and no adenopathy in the neck..  He does have some slight TMJ dysfunction on the left side compared to the right but no significant pain on exam today. Respiratory: Breathing comfortably  Skin: No facial/neck lesions or rash noted.  Procedures  Assessment: Ear pain questionable etiology.  May be related to TMJ dysfunction although he is having no discomfort presently. Ear canals and TMs are clear bilaterally.  Plan: Reviewed with patient today concerning normal ear and TM examination.  I suspect the ear pain is probably related to either TMJ dysfunction or other etiology as his ear canals and TMs are clear and he has had no hearing problems associated with the ear discomfort.   Radene Journey, MD   CC:

## 2020-08-01 ENCOUNTER — Encounter: Payer: Self-pay | Admitting: Family Medicine

## 2020-08-01 ENCOUNTER — Other Ambulatory Visit: Payer: Self-pay

## 2020-08-01 MED ORDER — AMLODIPINE BESYLATE 5 MG PO TABS
5.0000 mg | ORAL_TABLET | Freq: Every day | ORAL | 3 refills | Status: DC
Start: 2020-08-01 — End: 2021-04-27

## 2020-08-02 ENCOUNTER — Ambulatory Visit: Payer: Medicare Other | Admitting: Family Medicine

## 2020-08-10 ENCOUNTER — Ambulatory Visit: Payer: Medicare Other | Admitting: Family Medicine

## 2020-08-12 ENCOUNTER — Telehealth: Payer: Self-pay

## 2020-08-12 ENCOUNTER — Encounter: Payer: Self-pay | Admitting: Family Medicine

## 2020-08-12 DIAGNOSIS — I10 Essential (primary) hypertension: Secondary | ICD-10-CM

## 2020-08-12 DIAGNOSIS — E785 Hyperlipidemia, unspecified: Secondary | ICD-10-CM

## 2020-08-12 DIAGNOSIS — D72819 Decreased white blood cell count, unspecified: Secondary | ICD-10-CM

## 2020-08-12 DIAGNOSIS — E559 Vitamin D deficiency, unspecified: Secondary | ICD-10-CM

## 2020-08-15 ENCOUNTER — Other Ambulatory Visit: Payer: Self-pay

## 2020-08-15 ENCOUNTER — Other Ambulatory Visit (HOSPITAL_COMMUNITY)
Admission: RE | Admit: 2020-08-15 | Discharge: 2020-08-15 | Disposition: A | Discharge status: Home / Self Care | Payer: Medicare Other | Source: Ambulatory Visit | Attending: Family Medicine | Admitting: Family Medicine

## 2020-08-15 DIAGNOSIS — E785 Hyperlipidemia, unspecified: Secondary | ICD-10-CM | POA: Diagnosis not present

## 2020-08-15 DIAGNOSIS — I10 Essential (primary) hypertension: Secondary | ICD-10-CM | POA: Insufficient documentation

## 2020-08-15 DIAGNOSIS — E559 Vitamin D deficiency, unspecified: Secondary | ICD-10-CM | POA: Insufficient documentation

## 2020-08-15 DIAGNOSIS — D72819 Decreased white blood cell count, unspecified: Secondary | ICD-10-CM | POA: Insufficient documentation

## 2020-08-15 LAB — CBC WITH DIFFERENTIAL/PLATELET
Abs Immature Granulocytes: 0.01 10*3/uL (ref 0.00–0.07)
Basophils Absolute: 0 10*3/uL (ref 0.0–0.1)
Basophils Relative: 1 %
Eosinophils Absolute: 0.1 10*3/uL (ref 0.0–0.5)
Eosinophils Relative: 2 %
HCT: 42.3 % (ref 39.0–52.0)
Hemoglobin: 13.7 g/dL (ref 13.0–17.0)
Immature Granulocytes: 0 %
Lymphocytes Relative: 35 %
Lymphs Abs: 1.2 10*3/uL (ref 0.7–4.0)
MCH: 22.6 pg — ABNORMAL LOW (ref 26.0–34.0)
MCHC: 32.4 g/dL (ref 30.0–36.0)
MCV: 69.9 fL — ABNORMAL LOW (ref 80.0–100.0)
Monocytes Absolute: 0.3 10*3/uL (ref 0.1–1.0)
Monocytes Relative: 9 %
Neutro Abs: 1.8 10*3/uL (ref 1.7–7.7)
Neutrophils Relative %: 53 %
Platelets: 140 10*3/uL — ABNORMAL LOW (ref 150–400)
RBC: 6.05 MIL/uL — ABNORMAL HIGH (ref 4.22–5.81)
RDW: 16.8 % — ABNORMAL HIGH (ref 11.5–15.5)
WBC: 3.4 10*3/uL — ABNORMAL LOW (ref 4.0–10.5)
nRBC: 0 % (ref 0.0–0.2)

## 2020-08-15 LAB — VITAMIN D 25 HYDROXY (VIT D DEFICIENCY, FRACTURES): Vit D, 25-Hydroxy: 32.3 ng/mL (ref 30–100)

## 2020-08-15 LAB — LIPID PANEL
Cholesterol: 185 mg/dL (ref 0–200)
HDL: 58 mg/dL (ref >40)
LDL Cholesterol: 117 mg/dL — ABNORMAL HIGH (ref 0–99)
Total CHOL/HDL Ratio: 3.2 RATIO
Triglycerides: 50 mg/dL (ref <150)
VLDL: 10 mg/dL (ref 0–40)

## 2020-08-15 LAB — TSH: TSH: 2.369 u[IU]/mL (ref 0.350–4.500)

## 2020-08-17 ENCOUNTER — Encounter: Payer: Self-pay | Admitting: Family Medicine

## 2020-08-17 ENCOUNTER — Ambulatory Visit (INDEPENDENT_AMBULATORY_CARE_PROVIDER_SITE_OTHER): Payer: Medicare Other | Admitting: Family Medicine

## 2020-08-17 ENCOUNTER — Other Ambulatory Visit: Payer: Self-pay

## 2020-08-17 VITALS — BP 134/86 | HR 83 | Resp 16 | Ht 73.0 in | Wt 196.4 lb

## 2020-08-17 DIAGNOSIS — R229 Localized swelling, mass and lump, unspecified: Secondary | ICD-10-CM

## 2020-08-17 DIAGNOSIS — E785 Hyperlipidemia, unspecified: Secondary | ICD-10-CM

## 2020-08-17 DIAGNOSIS — J309 Allergic rhinitis, unspecified: Secondary | ICD-10-CM

## 2020-08-17 DIAGNOSIS — D72819 Decreased white blood cell count, unspecified: Secondary | ICD-10-CM

## 2020-08-17 DIAGNOSIS — I10 Essential (primary) hypertension: Secondary | ICD-10-CM

## 2020-08-17 NOTE — Assessment & Plan Note (Signed)
Sub optima; control DASH diet and commitment to daily physical activity for a minimum of 30 minutes discussed and encouraged, as a part of hypertension management. The importance of attaining a healthy weight is also discussed.  BP/Weight 08/17/2020 06/16/2020 06/15/2020 03/16/2020 02/18/2020 02/04/2020 50/12/3816  Systolic BP 299 371 696 789 381 017 510  Diastolic BP 86 78 88 96 88 84 84  Wt. (Lbs) 196.4 196 192 190 191 191 191  BMI 25.91 25.86 25.33 24.39 25.2 25.2 25.2

## 2020-08-17 NOTE — Progress Notes (Signed)
   Mark Sandoval     MRN: [id number]      DOB: 1950-08-12   HPI Mark Sandoval is here for follow up and re-evaluation of chronic medical conditions, medication management and review of any available recent lab and radiology data.  Preventive health is updated, specifically  Cancer screening and Immunization.   Has upcoming appt with hematology at [facility], and follows with 2 different Urology groups C/O nodule under skin of abdomen increasing in sizE and becoming painful, wants it removed no exercise commitment for health and concerned about elevated LDL and weight gain, will change / modify behavior ROS Denies recent fever or chills. Denies sinus pressure, nasal congestion, ear pain or sore throat. Denies chest congestion, productive cough or wheezing. Denies chest pains, palpitations and leg swelling Denies abdominal pain, nausea, vomiting,diarrhea or constipation.   Denies dysuria, frequency, hesitancy or incontinence. Denies joint pain, swelling and limitation in mobility. Denies headaches, seizures, numbness, or tingling. Denies depression, anxiety or insomnia. Denies skin break down or rash.   PE  BP 134/86   Pulse 83   Resp 16   Ht 6\' 1"  (1.854 m)   Wt 196 lb 6.4 oz (89.1 kg)   SpO2 98%   BMI 25.91 kg/m   Patient alert and oriented and in no cardiopulmonary distress.  HEENT: No facial asymmetry, EOMI,     Neck supple .  Chest: Clear to auscultation bilaterally.  CVS: S1, S2 no murmurs, no S3.Regular rate.  ABD: Soft non tender. Subcutaneous nodule left of umbilicus approx 2.5 cm diameter, tender   Ext: No edema  MS: Adequate ROM spine, shoulders, hips and knees.  Skin: Intact, no ulcerations or rash noted.  Psych: Good eye contact, normal affect. Memory intact not anxious or depressed appearing.  CNS: CN 2-12 intact, power,  normal throughout.no focal deficits noted.   Assessment & Plan  Skin nodule Present x 2 years increasing in size wants it removed,  refer  Dyslipidemia, goal LDL below 100 Hyperlipidemia:Low fat diet discussed and encouraged. Needs to reduce fat in diet  Lipid Panel  Lab Results  Component Value Date   CHOL 185 08/15/2020   HDL 58 08/15/2020   LDLCALC 117 (H) 08/15/2020   TRIG 50 08/15/2020   CHOLHDL 3.2 08/15/2020       Essential hypertension Sub optima; control DASH diet and commitment to daily physical activity for a minimum of 30 minutes discussed and encouraged, as a part of hypertension management. The importance of attaining a healthy weight is also discussed.  BP/Weight 08/17/2020 06/16/2020 06/15/2020 03/16/2020 02/18/2020 02/04/2020 16/60/6301  Systolic BP 601 093 235 573 220 254 270  Diastolic BP 86 78 88 96 88 84 84  Wt. (Lbs) 196.4 196 192 190 191 191 191  BMI 25.91 25.86 25.33 24.39 25.2 25.2 25.2       Allergic rhinitis No current flare, generally occurs in the Fall  Leukocytopenia Will continue

## 2020-08-17 NOTE — Assessment & Plan Note (Signed)
No current flare, generally occurs in the Fall

## 2020-08-17 NOTE — Assessment & Plan Note (Addendum)
Hyperlipidemia:Low fat diet discussed and encouraged. Needs to reduce fat in diet  Lipid Panel  Lab Results  Component Value Date   CHOL 185 08/15/2020   HDL 58 08/15/2020   LDLCALC 117 (H) 08/15/2020   TRIG 50 08/15/2020   CHOLHDL 3.2 08/15/2020

## 2020-08-17 NOTE — Patient Instructions (Addendum)
F/U in 6 months, call if you need me before  Please keep hematology appointment  You are referred to Dr Arnoldo Morale re skin nodule on  abdomen  Please start regular exercise for health and reduce fat in diet  Fasting lipid. Chem 7 and EGFR 5 days before next visit ( [facility])  Thanks for choosing [facility], we consider it a privelige to serve you.

## 2020-08-17 NOTE — Assessment & Plan Note (Signed)
Will continue

## 2020-08-17 NOTE — Assessment & Plan Note (Signed)
Present x 2 years increasing in size wants it removed, refer

## 2020-09-06 ENCOUNTER — Encounter: Payer: Self-pay | Admitting: General Surgery

## 2020-09-06 ENCOUNTER — Other Ambulatory Visit: Payer: Self-pay

## 2020-09-06 ENCOUNTER — Ambulatory Visit (INDEPENDENT_AMBULATORY_CARE_PROVIDER_SITE_OTHER): Payer: Medicare Other | Admitting: General Surgery

## 2020-09-06 VITALS — BP 135/91 | HR 79 | Temp 98.2°F | Resp 12 | Ht 73.0 in | Wt 194.0 lb

## 2020-09-06 DIAGNOSIS — D171 Benign lipomatous neoplasm of skin and subcutaneous tissue of trunk: Secondary | ICD-10-CM | POA: Diagnosis not present

## 2020-09-06 DIAGNOSIS — Z20822 Contact with and (suspected) exposure to covid-19: Secondary | ICD-10-CM | POA: Diagnosis not present

## 2020-09-06 NOTE — Patient Instructions (Signed)
Lipoma  A lipoma is a noncancerous (benign) tumor that is made up of fat cells. This is a very common type of soft-tissue growth. Lipomas are usually found under the skin (subcutaneous). They may occur in any tissue of the body that contains fat. Common areas for lipomas to appear include the back, arms, shoulders, buttocks, and thighs. Lipomas grow slowly, and they are usually painless. Most lipomas do not cause problems and do not require treatment. What are the causes? The cause of this condition is not known. What increases the risk? You are more likely to develop this condition if:  You are 40-60 years old.  You have a family history of lipomas. What are the signs or symptoms? A lipoma usually appears as a small, round bump under the skin. In most cases, the lump will:  Feel soft or rubbery.  Not cause pain or other symptoms. However, if a lipoma is located in an area where it pushes on nerves, it can become painful or cause other symptoms. How is this diagnosed? A lipoma can usually be diagnosed with a physical exam. You may also have tests to confirm the diagnosis and to rule out other conditions. Tests may include:  Imaging tests, such as a CT scan or an MRI.  Removal of a tissue sample to be looked at under a microscope (biopsy). How is this treated? Treatment for this condition depends on the size of the lipoma and whether it is causing any symptoms.  For small lipomas that are not causing problems, no treatment is needed.  If a lipoma is bigger or it causes problems, surgery may be done to remove the lipoma. Lipomas can also be removed to improve appearance. Most often, the procedure is done after applying a medicine that numbs the area (local anesthetic).  Liposuction may be done to reduce the size of the lipoma before it is removed through surgery, or it may be done to remove the lipoma. Lipomas are removed with this method in order to limit incision size and scarring. A  liposuction tube is inserted through a small incision into the lipoma, and the contents of the lipoma are removed through the tube with suction. Follow these instructions at home:  Watch your lipoma for any changes.  Keep all follow-up visits as told by your health care provider. This is important. Contact a health care provider if:  Your lipoma becomes larger or hard.  Your lipoma becomes painful, red, or increasingly swollen. These could be signs of infection or a more serious condition. Get help right away if:  You develop tingling or numbness in an area near the lipoma. This could indicate that the lipoma is causing nerve damage. Summary  A lipoma is a noncancerous tumor that is made up of fat cells.  Most lipomas do not cause problems and do not require treatment.  If a lipoma is bigger or it causes problems, surgery may be done to remove the lipoma.  Contact a health care provider if your lipoma becomes larger or hard, or if it becomes painful, red, or increasingly swollen. Pain, redness, and swelling could be signs of infection or a more serious condition. This information is not intended to replace advice given to you by your health care provider. Make sure you discuss any questions you have with your health care provider. Document Revised: 11/17/2018 Document Reviewed: 11/17/2018 Elsevier Patient Education  2021 [employer]  

## 2020-09-06 NOTE — H&P (Signed)
Hobart Marte [place]; [id number]; 14-Nov-1950   HPI Patient is a 70 year old black male who was referred to my care by Dr. Moshe Cipro for evaluation and treatment of a mass on the abdominal wall.  Patient states he has had lipomas excised in the past.  This particular 1 has been present for some time now, but seems to be slightly increased in size and is causing him discomfort. Past Medical History:  Diagnosis Date  . Dyslipidemia   . GERD (gastroesophageal reflux disease)   . History of seasonal allergies   . Hypertension   . Leukopenia   . Lump or mass in breast    benign  . Other spondylosis with radiculopathy, cervical region   . Thrombocytopenia ([place])     Past Surgical History:  Procedure Laterality Date  . athroscopy of left knee  2002  . CHOLECYSTECTOMY  2007  . COLONOSCOPY N/A 03/29/2016   Procedure: COLONOSCOPY;  Surgeon: Rogene Houston, MD;  Location: [facility];  Service: Endoscopy;  Laterality: N/A;  1030  . removal of benign left breast lump  09/06/07    Family History  Problem Relation Age of Onset  . Cancer Mother        hematologic cancer   . Hypertension Mother   . Alcohol abuse Father     Current Outpatient Medications on File Prior to Visit  Medication Sig Dispense Refill  . amLODipine (NORVASC) 5 MG tablet Take 1 tablet (5 mg total) by mouth daily. 90 tablet 3  . Multiple Vitamin (MULTIVITAMIN) tablet Take 1 tablet by mouth daily.     No current facility-administered medications on file prior to visit.    Allergies  Allergen Reactions  . Compazine Other (See Comments)    tongue movement disorders/MD in ER stated he would have expired  . Other Swelling  . Daucus Carota Other (See Comments)    Mouth swollen    Social History   Substance and Sexual Activity  Alcohol Use Yes  . Alcohol/week: 0.0 standard drinks   Comment: occasional    Social History   Tobacco Use  Smoking Status Never Smoker  Smokeless Tobacco Never Used    Review of  Systems  Constitutional: Negative.   HENT: Negative.   Eyes: Negative.   Respiratory: Negative.   Cardiovascular: Negative.   Gastrointestinal: Negative.   Genitourinary: Negative.   Musculoskeletal: Negative.   Skin: Negative.   Neurological: Negative.   Endo/Heme/Allergies: Negative.   Psychiatric/Behavioral: Negative.     Objective   Vitals:   09/06/20 1352  BP: (!) 135/91  Pulse: 79  Resp: 12  Temp: 98.2 F (36.8 C)  SpO2: 98%    Physical Exam Vitals reviewed.  Constitutional:      Appearance: Normal appearance. He is normal weight. He is not ill-appearing.  HENT:     Head: Normocephalic and atraumatic.  Cardiovascular:     Rate and Rhythm: Normal rate and regular rhythm.     Heart sounds: Normal heart sounds. No murmur heard. No friction rub. No gallop.   Pulmonary:     Effort: Pulmonary effort is normal. No respiratory distress.     Breath sounds: Normal breath sounds. No stridor. No wheezing, rhonchi or rales.  Abdominal:     General: Bowel sounds are normal. There is no distension.     Palpations: Abdomen is soft. There is mass.     Tenderness: There is no abdominal tenderness. There is no guarding or rebound.     Hernia: No hernia  is present.     Comments: 2 cm ovoid subcutaneous mobile mass noted to the left and superior to the umbilicus.  No overlying skin changes present.  Rubbery in nature.  It is tender to touch.  Skin:    General: Skin is warm and dry.  Neurological:     Mental Status: He is alert and oriented to person, place, and time.     Assessment  Subcutaneous mass, abdominal wall, probable lipoma Plan   Patient is scheduled for excision of the lipoma of the abdominal wall on 09/23/2020.  The risks and benefits of the procedure including bleeding, infection, and recurrence of the mass were fully explained to the patient, who gave informed consent.

## 2020-09-06 NOTE — Progress Notes (Signed)
Mark Sandoval; [id number]; 1950-09-03   HPI Patient is a 70 year old black male who was referred to my care by Mark Sandoval for evaluation and treatment of a mass on the abdominal wall.  Patient states he has had lipomas excised in the past.  This particular 1 has been present for some time now, but seems to be slightly increased in size and is causing him discomfort. Past Medical History:  Diagnosis Date  . Dyslipidemia   . GERD (gastroesophageal reflux disease)   . History of seasonal allergies   . Hypertension   . Leukopenia   . Lump or mass in breast    benign  . Other spondylosis with radiculopathy, cervical region   . Thrombocytopenia ([place])     Past Surgical History:  Procedure Laterality Date  . athroscopy of left knee  2002  . CHOLECYSTECTOMY  2007  . COLONOSCOPY N/A 03/29/2016   Procedure: COLONOSCOPY;  Surgeon: Rogene Houston, MD;  Location: [facility];  Service: Endoscopy;  Laterality: N/A;  1030  . removal of benign left breast lump  09/06/07    Family History  Problem Relation Age of Onset  . Cancer Mother        hematologic cancer   . Hypertension Mother   . Alcohol abuse Father     Current Outpatient Medications on File Prior to Visit  Medication Sig Dispense Refill  . amLODipine (NORVASC) 5 MG tablet Take 1 tablet (5 mg total) by mouth daily. 90 tablet 3  . Multiple Vitamin (MULTIVITAMIN) tablet Take 1 tablet by mouth daily.     No current facility-administered medications on file prior to visit.    Allergies  Allergen Reactions  . Compazine Other (See Comments)    tongue movement disorders/MD in ER stated he would have expired  . Other Swelling  . Daucus Carota Other (See Comments)    Mouth swollen    Social History   Substance and Sexual Activity  Alcohol Use Yes  . Alcohol/week: 0.0 standard drinks   Comment: occasional    Social History   Tobacco Use  Smoking Status Never Smoker  Smokeless Tobacco Never Used    Review of  Systems  Constitutional: Negative.   HENT: Negative.   Eyes: Negative.   Respiratory: Negative.   Cardiovascular: Negative.   Gastrointestinal: Negative.   Genitourinary: Negative.   Musculoskeletal: Negative.   Skin: Negative.   Neurological: Negative.   Endo/Heme/Allergies: Negative.   Psychiatric/Behavioral: Negative.     Objective   Vitals:   09/06/20 1352  BP: (!) 135/91  Pulse: 79  Resp: 12  Temp: 98.2 F (36.8 C)  SpO2: 98%    Physical Exam Vitals reviewed.  Constitutional:      Appearance: Normal appearance. He is normal weight. He is not ill-appearing.  HENT:     Head: Normocephalic and atraumatic.  Cardiovascular:     Rate and Rhythm: Normal rate and regular rhythm.     Heart sounds: Normal heart sounds. No murmur heard. No friction rub. No gallop.   Pulmonary:     Effort: Pulmonary effort is normal. No respiratory distress.     Breath sounds: Normal breath sounds. No stridor. No wheezing, rhonchi or rales.  Abdominal:     General: Bowel sounds are normal. There is no distension.     Palpations: Abdomen is soft. There is mass.     Tenderness: There is no abdominal tenderness. There is no guarding or rebound.     Hernia: No hernia  is present.     Comments: 2 cm ovoid subcutaneous mobile mass noted to the left and superior to the umbilicus.  No overlying skin changes present.  Rubbery in nature.  It is tender to touch.  Skin:    General: Skin is warm and dry.  Neurological:     Mental Status: He is alert and oriented to person, place, and time.     Assessment  Subcutaneous mass, abdominal wall, probable lipoma Plan   Patient is scheduled for excision of the lipoma of the abdominal wall on 09/23/2020.  The risks and benefits of the procedure including bleeding, infection, and recurrence of the mass were fully explained to the patient, who gave informed consent.

## 2020-09-20 NOTE — Patient Instructions (Signed)
Mark Sandoval  09/20/2020     @PREFPERIOPPHARMACY @   Your procedure is scheduled on  09/23/2020.   Report to [facility] at  [phone]  A.M.   Call this number if you have problems the morning of surgery:  [phone]   Remember:  Do not eat or drink after midnight.                         Take these medicines the morning of surgery with A SIP OF WATER  Amlodipine.     Please brush your teeth.  Do not wear jewelry, make-up or nail polish.  Do not wear lotions, powders, or perfumes, or deodorant.  Do not shave 48 hours prior to surgery.  Men may shave face and neck.  Do not bring valuables to the hospital.  [facility] is not responsible for any belongings or valuables.  Contacts, dentures or bridgework may not be worn into surgery.  Leave your suitcase in the car.  After surgery it may be brought to your room.  For patients admitted to the hospital, discharge time will be determined by your treatment team.  Patients discharged the day of surgery will not be allowed to drive home and must have someone with them.   Special instructions:  DO NOT smoke tobacco or vape for 24 hours before your procedure.  Please read over the following fact sheets that you were given. Coughing and Deep Breathing, Surgical Site Infection Prevention, Anesthesia Post-op Instructions and Care and Recovery After Surgery       Lipoma Removal, Care After This sheet gives you information about how to care for yourself after your procedure. Your health care provider may also give you more specific instructions. If you have problems or questions, contact your health care provider. What can I expect after the procedure? After the procedure, it is common to have:  Mild pain.  Swelling.  Bruising. Follow these instructions at home: Bathing  Do not take baths, swim, or use a hot tub until your health care provider approves. Ask your health care provider if you may take showers. You  may only be allowed to take sponge baths.  Keep your bandage (dressing) dry until your health care provider says it can be removed.   Incision care  Follow instructions from your health care provider about how to take care of your incision. Make sure you: ? Wash your hands with soap and water for at least 20 seconds before and after you change your dressing. If soap and water are not available, use hand sanitizer. ? Change your dressing as told by your health care provider. ? Leave stitches (sutures), skin glue, or adhesive strips in place. These skin closures may need to stay in place for 2 weeks or longer. If adhesive strip edges start to loosen and curl up, you may trim the loose edges. Do not remove adhesive strips completely unless your health care provider tells you to do that.  Check your incision area every day for signs of infection. Check for: ? More redness, swelling, or pain. ? Fluid or blood. ? Warmth. ? Pus or a bad smell.   Medicines  Take over-the-counter and prescription medicines only as told by your health care provider.  If you were prescribed an antibiotic medicine, use it as told by your health care provider. Do not stop using the antibiotic even if you start to feel better.  General instructions  If you were given a sedative during the procedure, it can affect you for several hours. Do not drive or operate machinery until your health care provider says that it is safe.  Do not use any products that contain nicotine or tobacco, such as cigarettes, e-cigarettes, and chewing tobacco. These can delay healing. If you need help quitting, ask your health care provider.  Return to your normal activities as told by your health care provider. Ask your health care provider what activities are safe for you.  Keep all follow-up visits as told by your health care provider. This is important.   Contact a health care provider if:  You have more redness, swelling, or pain around  your incision.  You have fluid or blood coming from your incision.  Your incision feels warm to the touch.  You have pus or a bad smell coming from your incision.  You have pain that does not get better with medicine. Get help right away if:  You have chills or a fever.  You have severe pain. Summary  After the procedure, it is common to have mild pain, swelling, and bruising.  Follow instructions from your health care provider about how to take care of your incision.  Check your incision area every day for signs of infection.  Contact a health care provider if you have more redness, swelling, or pain around your incision. This information is not intended to replace advice given to you by your health care provider. Make sure you discuss any questions you have with your health care provider. Document Revised: 11/17/2018 Document Reviewed: 11/17/2018 Elsevier Patient Education  2021 [place] Anesthesia, Adult, Care After This sheet gives you information about how to care for yourself after your procedure. Your health care provider may also give you more specific instructions. If you have problems or questions, contact your health care provider. What can I expect after the procedure? After the procedure, the following side effects are common:  Pain or discomfort at the IV site.  Nausea.  Vomiting.  Sore throat.  Trouble concentrating.  Feeling cold or chills.  Feeling weak or tired.  Sleepiness and fatigue.  Soreness and body aches. These side effects can affect parts of the body that were not involved in surgery. Follow these instructions at home: For the time period you were told by your health care provider:  Rest.  Do not participate in activities where you could fall or become injured.  Do not drive or use machinery.  Do not drink alcohol.  Do not take sleeping pills or medicines that cause drowsiness.  Do not make important decisions or  sign legal documents.  Do not take care of children on your own.   Eating and drinking  Follow any instructions from your health care provider about eating or drinking restrictions.  When you feel hungry, start by eating small amounts of foods that are soft and easy to digest (bland), such as toast. Gradually return to your regular diet.  Drink enough fluid to keep your urine pale yellow.  If you vomit, rehydrate by drinking water, juice, or clear broth. General instructions  If you have sleep apnea, surgery and certain medicines can increase your risk for breathing problems. Follow instructions from your health care provider about wearing your sleep device: ? Anytime you are sleeping, including during daytime naps. ? While taking prescription pain medicines, sleeping medicines, or medicines that make you drowsy.  Have a responsible adult  stay with you for the time you are told. It is important to have someone help care for you until you are awake and alert.  Return to your normal activities as told by your health care provider. Ask your health care provider what activities are safe for you.  Take over-the-counter and prescription medicines only as told by your health care provider.  If you smoke, do not smoke without supervision.  Keep all follow-up visits as told by your health care provider. This is important. Contact a health care provider if:  You have nausea or vomiting that does not get better with medicine.  You cannot eat or drink without vomiting.  You have pain that does not get better with medicine.  You are unable to pass urine.  You develop a skin rash.  You have a fever.  You have redness around your IV site that gets worse. Get help right away if:  You have difficulty breathing.  You have chest pain.  You have blood in your urine or stool, or you vomit blood. Summary  After the procedure, it is common to have a sore throat or nausea. It is also  common to feel tired.  Have a responsible adult stay with you for the time you are told. It is important to have someone help care for you until you are awake and alert.  When you feel hungry, start by eating small amounts of foods that are soft and easy to digest (bland), such as toast. Gradually return to your regular diet.  Drink enough fluid to keep your urine pale yellow.  Return to your normal activities as told by your health care provider. Ask your health care provider what activities are safe for you. This information is not intended to replace advice given to you by your health care provider. Make sure you discuss any questions you have with your health care provider. Document Revised: 12/17/2019 Document Reviewed: 07/16/2019 Elsevier Patient Education  2021 [place]. How to Use Chlorhexidine for Bathing Chlorhexidine gluconate (CHG) is a germ-killing (antiseptic) solution that is used to clean the skin. It can get rid of the bacteria that normally live on the skin and can keep them away for about 24 hours. To clean your skin with CHG, you may be given:  A CHG solution to use in the shower or as part of a sponge bath.  A prepackaged cloth that contains CHG. Cleaning your skin with CHG may help lower the risk for infection:  While you are staying in the intensive care unit of the hospital.  If you have a vascular access, such as a central line, to provide short-term or long-term access to your veins.  If you have a catheter to drain urine from your bladder.  If you are on a ventilator. A ventilator is a machine that helps you breathe by moving air in and out of your lungs.  After surgery. What are the risks? Risks of using CHG include:  A skin reaction.  Hearing loss, if CHG gets in your ears.  Eye injury, if CHG gets in your eyes and is not rinsed out.  The CHG product catching fire. Make sure that you avoid smoking and flames after applying CHG to your skin. Do  not use CHG:  If you have a chlorhexidine allergy or have previously reacted to chlorhexidine.  On babies younger than 59 months of age. How to use CHG solution  Use CHG only as told by your health care  provider, and follow the instructions on the label.  Use the full amount of CHG as directed. Usually, this is one bottle. During a shower Follow these steps when using CHG solution during a shower (unless your health care provider gives you different instructions): 1. Start the shower. 2. Use your normal soap and shampoo to wash your face and hair. 3. Turn off the shower or move out of the shower stream. 4. Pour the CHG onto a clean washcloth. Do not use any type of brush or rough-edged sponge. 5. Starting at your neck, lather your body down to your toes. Make sure you follow these instructions: ? If you will be having surgery, pay special attention to the part of your body where you will be having surgery. Scrub this area for at least 1 minute. ? Do not use CHG on your head or face. If the solution gets into your ears or eyes, rinse them well with water. ? Avoid your genital area. ? Avoid any areas of skin that have broken skin, cuts, or scrapes. ? Scrub your back and under your arms. Make sure to wash skin folds. 6. Let the lather sit on your skin for 1-2 minutes or as long as told by your health care provider. 7. Thoroughly rinse your entire body in the shower. Make sure that all body creases and crevices are rinsed well. 8. Dry off with a clean towel. Do not put any substances on your body afterward--such as powder, lotion, or perfume--unless you are told to do so by your health care provider. Only use lotions that are recommended by the manufacturer. 9. Put on clean clothes or pajamas. 10. If it is the night before your surgery, sleep in clean sheets.   During a sponge bath Follow these steps when using CHG solution during a sponge bath (unless your health care provider gives you  different instructions): 1. Use your normal soap and shampoo to wash your face and hair. 2. Pour the CHG onto a clean washcloth. 3. Starting at your neck, lather your body down to your toes. Make sure you follow these instructions: ? If you will be having surgery, pay special attention to the part of your body where you will be having surgery. Scrub this area for at least 1 minute. ? Do not use CHG on your head or face. If the solution gets into your ears or eyes, rinse them well with water. ? Avoid your genital area. ? Avoid any areas of skin that have broken skin, cuts, or scrapes. ? Scrub your back and under your arms. Make sure to wash skin folds. 4. Let the lather sit on your skin for 1-2 minutes or as long as told by your health care provider. 5. Using a different clean, wet washcloth, thoroughly rinse your entire body. Make sure that all body creases and crevices are rinsed well. 6. Dry off with a clean towel. Do not put any substances on your body afterward--such as powder, lotion, or perfume--unless you are told to do so by your health care provider. Only use lotions that are recommended by the manufacturer. 7. Put on clean clothes or pajamas. 8. If it is the night before your surgery, sleep in clean sheets. How to use CHG prepackaged cloths  Only use CHG cloths as told by your health care provider, and follow the instructions on the label.  Use the CHG cloth on clean, dry skin.  Do not use the CHG cloth on your head or face  unless your health care provider tells you to.  When washing with the CHG cloth: ? Avoid your genital area. ? Avoid any areas of skin that have broken skin, cuts, or scrapes. Before surgery Follow these steps when using a CHG cloth to clean before surgery (unless your health care provider gives you different instructions): 1. Using the CHG cloth, vigorously scrub the part of your body where you will be having surgery. Scrub using a back-and-forth motion for 3  minutes. The area on your body should be completely wet with CHG when you are done scrubbing. 2. Do not rinse. Discard the cloth and let the area air-dry. Do not put any substances on the area afterward, such as powder, lotion, or perfume. 3. Put on clean clothes or pajamas. 4. If it is the night before your surgery, sleep in clean sheets.   For general bathing Follow these steps when using CHG cloths for general bathing (unless your health care provider gives you different instructions). 1. Use a separate CHG cloth for each area of your body. Make sure you wash between any folds of skin and between your fingers and toes. Wash your body in the following order, switching to a new cloth after each step: ? The front of your neck, shoulders, and chest. ? Both of your arms, under your arms, and your hands. ? Your stomach and groin area, avoiding the genitals. ? Your right leg and foot. ? Your left leg and foot. ? The back of your neck, your back, and your buttocks. 2. Do not rinse. Discard the cloth and let the area air-dry. Do not put any substances on your body afterward--such as powder, lotion, or perfume--unless you are told to do so by your health care provider. Only use lotions that are recommended by the manufacturer. 3. Put on clean clothes or pajamas. Contact a health care provider if:  Your skin gets irritated after scrubbing.  You have questions about using your solution or cloth. Get help right away if:  Your eyes become very red or swollen.  Your eyes itch badly.  Your skin itches badly and is red or swollen.  Your hearing changes.  You have trouble seeing.  You have swelling or tingling in your mouth or throat.  You have trouble breathing.  You swallow any chlorhexidine. Summary  Chlorhexidine gluconate (CHG) is a germ-killing (antiseptic) solution that is used to clean the skin. Cleaning your skin with CHG may help to lower your risk for infection.  You may be given  CHG to use for bathing. It may be in a bottle or in a prepackaged cloth to use on your skin. Carefully follow your health care provider's instructions and the instructions on the product label.  Do not use CHG if you have a chlorhexidine allergy.  Contact your health care provider if your skin gets irritated after scrubbing. This information is not intended to replace advice given to you by your health care provider. Make sure you discuss any questions you have with your health care provider. Document Revised: 09/18/2019 Document Reviewed: 09/18/2019 Elsevier Patient Education  [place].

## 2020-09-21 ENCOUNTER — Encounter (HOSPITAL_COMMUNITY)
Admission: RE | Admit: 2020-09-21 | Discharge: 2020-09-21 | Disposition: A | Discharge status: Home / Self Care | Payer: Medicare Other | Source: Ambulatory Visit | Attending: General Surgery | Admitting: General Surgery

## 2020-09-21 ENCOUNTER — Other Ambulatory Visit: Payer: Self-pay

## 2020-09-21 ENCOUNTER — Other Ambulatory Visit (HOSPITAL_COMMUNITY): Payer: Medicare Other

## 2020-09-21 ENCOUNTER — Encounter (HOSPITAL_COMMUNITY): Payer: Self-pay

## 2020-09-21 DIAGNOSIS — Z01812 Encounter for preprocedural laboratory examination: Secondary | ICD-10-CM | POA: Insufficient documentation

## 2020-09-22 DIAGNOSIS — D696 Thrombocytopenia, unspecified: Secondary | ICD-10-CM | POA: Diagnosis not present

## 2020-09-23 ENCOUNTER — Encounter (HOSPITAL_COMMUNITY)
Admission: RE | Disposition: A | Discharge status: Home / Self Care | Payer: Self-pay | Source: Home / Self Care | Attending: General Surgery

## 2020-09-23 ENCOUNTER — Encounter (HOSPITAL_COMMUNITY): Payer: Self-pay | Admitting: General Surgery

## 2020-09-23 ENCOUNTER — Ambulatory Visit (HOSPITAL_COMMUNITY): Payer: Medicare Other | Admitting: Anesthesiology

## 2020-09-23 ENCOUNTER — Ambulatory Visit (HOSPITAL_COMMUNITY)
Admission: RE | Admit: 2020-09-23 | Discharge: 2020-09-23 | Disposition: A | Discharge status: Home / Self Care | Payer: Medicare Other | Attending: General Surgery | Admitting: General Surgery

## 2020-09-23 DIAGNOSIS — Z79899 Other long term (current) drug therapy: Secondary | ICD-10-CM | POA: Diagnosis not present

## 2020-09-23 DIAGNOSIS — I10 Essential (primary) hypertension: Secondary | ICD-10-CM | POA: Diagnosis not present

## 2020-09-23 DIAGNOSIS — Z809 Family history of malignant neoplasm, unspecified: Secondary | ICD-10-CM | POA: Insufficient documentation

## 2020-09-23 DIAGNOSIS — Z8249 Family history of ischemic heart disease and other diseases of the circulatory system: Secondary | ICD-10-CM | POA: Insufficient documentation

## 2020-09-23 DIAGNOSIS — D171 Benign lipomatous neoplasm of skin and subcutaneous tissue of trunk: Secondary | ICD-10-CM

## 2020-09-23 DIAGNOSIS — Z888 Allergy status to other drugs, medicaments and biological substances status: Secondary | ICD-10-CM | POA: Insufficient documentation

## 2020-09-23 HISTORY — PX: LIPOMA EXCISION: SHX5283

## 2020-09-23 SURGERY — EXCISION LIPOMA
Anesthesia: General | Site: Abdomen

## 2020-09-23 MED ORDER — POVIDONE-IODINE 10 % EX OINT
TOPICAL_OINTMENT | CUTANEOUS | Status: AC
  Filled 2020-09-23: qty 1

## 2020-09-23 MED ORDER — CHLORHEXIDINE GLUCONATE CLOTH 2 % EX PADS: 6.0000 | MEDICATED_PAD | Freq: Once | CUTANEOUS | Status: DC

## 2020-09-23 MED ORDER — ONDANSETRON HCL 4 MG/2ML IJ SOLN
INTRAMUSCULAR | Status: DC | PRN
  Administered 2020-09-23: 4 mg via INTRAVENOUS

## 2020-09-23 MED ORDER — FENTANYL CITRATE (PF) 100 MCG/2ML IJ SOLN
INTRAMUSCULAR | Status: AC
  Filled 2020-09-23: qty 2

## 2020-09-23 MED ORDER — FENTANYL CITRATE (PF) 100 MCG/2ML IJ SOLN: 25.0000 ug | INTRAMUSCULAR | Status: DC | PRN

## 2020-09-23 MED ORDER — ONDANSETRON HCL 4 MG/2ML IJ SOLN
INTRAMUSCULAR | Status: AC
  Filled 2020-09-23: qty 2

## 2020-09-23 MED ORDER — BUPIVACAINE HCL (PF) 0.5 % IJ SOLN
INTRAMUSCULAR | Status: AC
  Filled 2020-09-23: qty 30

## 2020-09-23 MED ORDER — LIDOCAINE HCL (CARDIAC) PF 100 MG/5ML IV SOSY
PREFILLED_SYRINGE | INTRAVENOUS | Status: DC | PRN
  Administered 2020-09-23: 50 mg via INTRAVENOUS

## 2020-09-23 MED ORDER — ORAL CARE MOUTH RINSE: 15.0000 mL | Freq: Once | OROMUCOSAL | Status: AC

## 2020-09-23 MED ORDER — LIDOCAINE HCL (PF) 2 % IJ SOLN
INTRAMUSCULAR | Status: AC
  Filled 2020-09-23: qty 5

## 2020-09-23 MED ORDER — MIDAZOLAM HCL 2 MG/2ML IJ SOLN
INTRAMUSCULAR | Status: AC
  Filled 2020-09-23: qty 2

## 2020-09-23 MED ORDER — CHLORHEXIDINE GLUCONATE 0.12 % MT SOLN
15.0000 mL | Freq: Once | OROMUCOSAL | Status: AC
  Administered 2020-09-23: 15 mL via OROMUCOSAL

## 2020-09-23 MED ORDER — 0.9 % SODIUM CHLORIDE (POUR BTL) OPTIME
TOPICAL | Status: DC | PRN
  Administered 2020-09-23: 1000 mL

## 2020-09-23 MED ORDER — PROPOFOL 10 MG/ML IV BOLUS
INTRAVENOUS | Status: DC | PRN
  Administered 2020-09-23: 200 mg via INTRAVENOUS

## 2020-09-23 MED ORDER — ONDANSETRON HCL 4 MG/2ML IJ SOLN: 4.0000 mg | Freq: Once | INTRAMUSCULAR | Status: DC | PRN

## 2020-09-23 MED ORDER — FENTANYL CITRATE (PF) 100 MCG/2ML IJ SOLN
INTRAMUSCULAR | Status: DC | PRN
  Administered 2020-09-23: 100 ug via INTRAVENOUS

## 2020-09-23 MED ORDER — BUPIVACAINE HCL (PF) 0.5 % IJ SOLN
INTRAMUSCULAR | Status: DC | PRN
  Administered 2020-09-23: 30 mL
  Administered 2020-09-23: 6 mL

## 2020-09-23 MED ORDER — HYDROCODONE-ACETAMINOPHEN 5-325 MG PO TABS: 1.0000 | ORAL_TABLET | ORAL | 0 refills | Status: DC | PRN

## 2020-09-23 MED ORDER — LACTATED RINGERS IV SOLN
INTRAVENOUS | Status: DC
  Administered 2020-09-23: 1000 mL via INTRAVENOUS

## 2020-09-23 MED ORDER — MEPERIDINE HCL 50 MG/ML IJ SOLN: 6.2500 mg | INTRAMUSCULAR | Status: DC | PRN

## 2020-09-23 MED ORDER — PROPOFOL 10 MG/ML IV BOLUS
INTRAVENOUS | Status: AC
  Filled 2020-09-23: qty 40

## 2020-09-23 MED ORDER — MIDAZOLAM HCL 5 MG/5ML IJ SOLN
INTRAMUSCULAR | Status: DC | PRN
  Administered 2020-09-23: 2 mg via INTRAVENOUS

## 2020-09-23 MED ORDER — DEXAMETHASONE SODIUM PHOSPHATE 10 MG/ML IJ SOLN
INTRAMUSCULAR | Status: AC
  Filled 2020-09-23: qty 1

## 2020-09-23 MED ORDER — DEXAMETHASONE SODIUM PHOSPHATE 10 MG/ML IJ SOLN
INTRAMUSCULAR | Status: DC | PRN
  Administered 2020-09-23: 10 mg via INTRAVENOUS

## 2020-09-23 MED ORDER — KETOROLAC TROMETHAMINE 30 MG/ML IJ SOLN
15.0000 mg | Freq: Once | INTRAMUSCULAR | Status: AC
  Administered 2020-09-23: 15 mg via INTRAVENOUS
  Filled 2020-09-23: qty 1

## 2020-09-23 SURGICAL SUPPLY — 27 items
ADH SKN CLS APL DERMABOND .7 (GAUZE/BANDAGES/DRESSINGS) ×1
APL PRP STRL LF DISP 70% ISPRP (MISCELLANEOUS) ×1
CHLORAPREP W/TINT 26 (MISCELLANEOUS) ×3 IMPLANT
CLOTH BEACON ORANGE TIMEOUT ST (SAFETY) ×3 IMPLANT
COVER LIGHT HANDLE STERIS (MISCELLANEOUS) ×6 IMPLANT
COVER WAND RF STERILE (DRAPES) ×3 IMPLANT
DECANTER SPIKE VIAL GLASS SM (MISCELLANEOUS) ×3 IMPLANT
DERMABOND ADVANCED (GAUZE/BANDAGES/DRESSINGS) ×2
DERMABOND ADVANCED .7 DNX12 (GAUZE/BANDAGES/DRESSINGS) ×1 IMPLANT
ELECT NEEDLE TIP 2.8 STRL (NEEDLE) ×3 IMPLANT
ELECT REM PT RETURN 9FT ADLT (ELECTROSURGICAL) ×3
ELECTRODE REM PT RTRN 9FT ADLT (ELECTROSURGICAL) ×1 IMPLANT
GLOVE SURG SS PI 7.5 STRL IVOR (GLOVE) ×3 IMPLANT
GLOVE SURG UNDER POLY LF SZ7 (GLOVE) ×6 IMPLANT
GOWN STRL REUS W/TWL LRG LVL3 (GOWN DISPOSABLE) ×6 IMPLANT
KIT TURNOVER KIT A (KITS) ×3 IMPLANT
MANIFOLD NEPTUNE II (INSTRUMENTS) ×3 IMPLANT
NEEDLE HYPO 25X1 1.5 SAFETY (NEEDLE) ×3 IMPLANT
NS IRRIG 1000ML POUR BTL (IV SOLUTION) ×3 IMPLANT
PACK MINOR (CUSTOM PROCEDURE TRAY) ×3 IMPLANT
PAD ARMBOARD 7.5X6 YLW CONV (MISCELLANEOUS) ×3 IMPLANT
SET BASIN LINEN APH (SET/KITS/TRAYS/PACK) ×3 IMPLANT
SUT ETHILON 3 0 FSL (SUTURE) ×3 IMPLANT
SUT MNCRL AB 4-0 PS2 18 (SUTURE) ×3 IMPLANT
SUT VIC AB 3-0 SH 27 (SUTURE) ×3
SUT VIC AB 3-0 SH 27X BRD (SUTURE) ×1 IMPLANT
SYR CONTROL 10ML LL (SYRINGE) ×3 IMPLANT

## 2020-09-23 NOTE — Addendum Note (Signed)
Addendum  created 09/23/20 1055 by Jonna Munro, CRNA   Flowsheet accepted

## 2020-09-23 NOTE — Anesthesia Procedure Notes (Signed)
Procedure Name: LMA Insertion Date/Time: 09/23/2020 8:05 AM Performed by: Jonna Munro, CRNA Pre-anesthesia Checklist: Patient identified, Patient being monitored, Emergency Drugs available, Timeout performed and Suction available Patient Re-evaluated:Patient Re-evaluated prior to induction Oxygen Delivery Method: Circle System Utilized Preoxygenation: Pre-oxygenation with 100% oxygen Induction Type: IV induction Ventilation: Mask ventilation without difficulty LMA: LMA inserted LMA Size: 4.0 and 5.0 Number of attempts: 1 Placement Confirmation: positive ETCO2 and breath sounds checked- equal and bilateral Tube secured with: Tape Dental Injury: Teeth and Oropharynx as per pre-operative assessment

## 2020-09-23 NOTE — Anesthesia Preprocedure Evaluation (Addendum)
Anesthesia Evaluation  Patient identified by MRN, date of birth, ID band Patient awake    Reviewed: Allergy & Precautions, NPO status , Patient's Chart, lab work & pertinent test results  History of Anesthesia Complications Negative for: history of anesthetic complications  Airway Mallampati: III  TM Distance: >3 FB Neck ROM: Full   Comment: Cervical radiculopathy  Dental  (+) Dental Advisory Given, Teeth Intact   Pulmonary neg pulmonary ROS,    Pulmonary exam normal breath sounds clear to auscultation       Cardiovascular Exercise Tolerance: Good hypertension, Pt. on medications Normal cardiovascular exam Rhythm:Regular Rate:Normal     Neuro/Psych  Neuromuscular disease (cervical radiculopathy)    GI/Hepatic GERD  Controlled,  Endo/Other    Renal/GU Renal InsufficiencyRenal disease     Musculoskeletal  (+) Arthritis ,   Abdominal   Peds  Hematology   Anesthesia Other Findings   Reproductive/Obstetrics                          Anesthesia Physical Anesthesia Plan  ASA: 2  Anesthesia Plan: General   Post-op Pain Management:    Induction: Intravenous  PONV Risk Score and Plan: Ondansetron and Dexamethasone  Airway Management Planned: LMA  Additional Equipment:   Intra-op Plan:   Post-operative Plan: Extubation in OR  Informed Consent: I have reviewed the patients History and Physical, chart, labs and discussed the procedure including the risks, benefits and alternatives for the proposed anesthesia with the patient or authorized representative who has indicated his/her understanding and acceptance.     Dental advisory given  Plan Discussed with: CRNA and Surgeon  Anesthesia Plan Comments:        Anesthesia Quick Evaluation

## 2020-09-23 NOTE — Discharge Instructions (Signed)

## 2020-09-23 NOTE — Anesthesia Postprocedure Evaluation (Signed)
Anesthesia Post Note  Patient: Mark Sandoval  Procedure(s) Performed: EXCISION LIPOMA; abdominal wall (Abdomen)  Patient location during evaluation: PACU Anesthesia Type: General Level of consciousness: awake and alert and oriented Pain management: pain level controlled Vital Signs Assessment: post-procedure vital signs reviewed and stable Respiratory status: spontaneous breathing and respiratory function stable Cardiovascular status: blood pressure returned to baseline and stable Postop Assessment: no apparent nausea or vomiting Anesthetic complications: no   No notable events documented.   Last Vitals:  Vitals:   09/23/20 0816 09/23/20 0837  BP: 102/71 (!) 154/99  Pulse: 64 62  Resp: (!) 9 16  Temp:  36.5 C  SpO2: 99% 100%    Last Pain:  Vitals:   09/23/20 0837  TempSrc: Axillary  PainSc: 0-No pain                 Emran Molzahn C Madysun Thall

## 2020-09-23 NOTE — Interval H&P Note (Signed)
History and Physical Interval Note:  09/23/2020 7:08 AM  Mark Sandoval  has presented today for surgery, with the diagnosis of Lipoma; abdominal wall.  The various methods of treatment have been discussed with the patient and family. After consideration of risks, benefits and other options for treatment, the patient has consented to  Procedure(s): EXCISION LIPOMA; abdominal wall (N/A) as a surgical intervention.  The patient's history has been reviewed, patient examined, no change in status, stable for surgery.  I have reviewed the patient's chart and labs.  Questions were answered to the patient's satisfaction.     Aviva Signs

## 2020-09-23 NOTE — Op Note (Signed)
Patient:  Leontae Bostock Klemens  DOB:  [birth year]  MRN:  [id number]   Preop Diagnosis: Subcutaneous mass, abdominal wall  Postop Diagnosis: Same, lipoma  Procedure: Excision of lipoma, abdominal wall  Surgeon: Aviva Signs, MD  Anes: General  Indications: Patient is a 70 year old black male who presents with a tender subcutaneous nodule of the abdominal wall.  The risks and benefits of the procedure including bleeding, infection, and the possibility of recurrence of the mass were fully explained to the patient, who gave informed consent.  Procedure note: The patient was placed in the supine position.  After general anesthesia was administered, the abdomen was prepped and draped using the usual sterile technique with ChloraPrep.  Surgical site confirmation was performed.  The subcutaneous nodule was present just superior and to the left of the umbilicus.  An incision was made transversely over the mass.  This was taken down to the subcutaneous tissue.  A lipoma was found.  The incision was approximately 2 cm in length.  The lipoma was removed and sent to pathology for further examination.  A bleeding was controlled using Bovie electrocautery.  0.5% Sensorcaine was instilled into the surrounding wound.  The skin was closed using a 4-0 Monocryl subcuticular suture.  Dermabond was applied.  All tape and needle counts were correct at the end of the procedure.  The patient was awakened and transferred to PACU in stable condition.  Complications: None  EBL: Minimal  Specimen: Abdominal wall lipoma

## 2020-09-23 NOTE — Transfer of Care (Signed)
Immediate Anesthesia Transfer of Care Note  Patient: Mark Sandoval  Procedure(s) Performed: EXCISION LIPOMA; abdominal wall (Abdomen)  Patient Location: PACU  Anesthesia Type:General  Level of Consciousness: awake, alert , oriented and patient cooperative  Airway & Oxygen Therapy: Patient Spontanous Breathing  Post-op Assessment: Report given to RN and Post -op Vital signs reviewed and stable  Post vital signs: Reviewed and stable  Last Vitals:  Vitals Value Taken Time  BP 107/73 09/23/20 0802  Temp    Pulse 68 09/23/20 0803  Resp 12 09/23/20 0803  SpO2 98 % 09/23/20 0803  Vitals shown include unvalidated device data.  Last Pain:  Vitals:   09/23/20 0641  TempSrc: Oral  PainSc: 0-No pain      Patients Stated Pain Goal: 9 (14/10/30 1314)  Complications: No notable events documented.

## 2020-09-26 ENCOUNTER — Encounter: Payer: Self-pay | Admitting: General Surgery

## 2020-09-26 ENCOUNTER — Encounter (HOSPITAL_COMMUNITY): Payer: Self-pay | Admitting: General Surgery

## 2020-09-26 LAB — SURGICAL PATHOLOGY

## 2020-09-27 ENCOUNTER — Encounter: Payer: Self-pay | Admitting: Family Medicine

## 2020-10-04 NOTE — Telephone Encounter (Signed)
Appt 6/27

## 2020-10-06 ENCOUNTER — Telehealth (INDEPENDENT_AMBULATORY_CARE_PROVIDER_SITE_OTHER): Payer: Medicare Other | Admitting: General Surgery

## 2020-10-06 DIAGNOSIS — Z09 Encounter for follow-up examination after completed treatment for conditions other than malignant neoplasm: Secondary | ICD-10-CM

## 2020-10-06 NOTE — Telephone Encounter (Signed)
Telephone postoperative visit performed with patient.  Patient is doing well.  His incision is healing well.  He has returned to normal activity.  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  Follow-up with me as needed.  As this was part of the global surgical fee, this was not a billable visit.  Total telephone time was 3 minutes.

## 2020-10-10 ENCOUNTER — Other Ambulatory Visit: Payer: Self-pay

## 2020-10-10 ENCOUNTER — Ambulatory Visit (INDEPENDENT_AMBULATORY_CARE_PROVIDER_SITE_OTHER): Payer: Medicare Other | Admitting: Family Medicine

## 2020-10-10 ENCOUNTER — Encounter: Payer: Self-pay | Admitting: Family Medicine

## 2020-10-10 VITALS — BP 130/88 | HR 74 | Temp 98.4°F | Resp 18 | Ht 74.0 in | Wt 196.0 lb

## 2020-10-10 DIAGNOSIS — N281 Cyst of kidney, acquired: Secondary | ICD-10-CM

## 2020-10-10 DIAGNOSIS — E785 Hyperlipidemia, unspecified: Secondary | ICD-10-CM | POA: Diagnosis not present

## 2020-10-10 DIAGNOSIS — E559 Vitamin D deficiency, unspecified: Secondary | ICD-10-CM

## 2020-10-10 DIAGNOSIS — I10 Essential (primary) hypertension: Secondary | ICD-10-CM | POA: Diagnosis not present

## 2020-10-10 DIAGNOSIS — R944 Abnormal results of kidney function studies: Secondary | ICD-10-CM

## 2020-10-10 DIAGNOSIS — R7989 Other specified abnormal findings of blood chemistry: Secondary | ICD-10-CM | POA: Diagnosis not present

## 2020-10-10 DIAGNOSIS — D72819 Decreased white blood cell count, unspecified: Secondary | ICD-10-CM | POA: Diagnosis not present

## 2020-10-10 NOTE — Patient Instructions (Addendum)
F/U in December, call if you need me sooner  Labs today  cmp and eGFR, TSH, vit D, and you are referred to [facility]  It is important that you exercise regularly at least 30 minutes 5 times a week. If you develop chest pain, have severe difficulty breathing, or feel very tired, stop exercising immediately and seek medical attention `   Think about what you will eat, plan ahead. Choose " clean, green, fresh or frozen" over canned, processed or packaged foods which are more sugary, salty and fatty. 70 to 75% of food eaten should be vegetables and fruit. Three meals at set times with snacks allowed between meals, but they must be fruit or vegetables. Aim to eat over a 12 hour period , example 7 am to 7 pm, and STOP after  your last meal of the day. Drink water,generally about 64 ounces per day, no other drink is as healthy. Fruit juice is best enjoyed in a healthy way, by EATING the fruit.  Thanks for choosing [facility], we consider it a privelige to serve you.

## 2020-10-11 ENCOUNTER — Encounter: Payer: Self-pay | Admitting: Family Medicine

## 2020-10-11 LAB — CMP14+EGFR
ALT: 17 IU/L (ref 0–44)
AST: 22 IU/L (ref 0–40)
Albumin/Globulin Ratio: 1.8 (ref 1.2–2.2)
Albumin: 4.2 g/dL (ref 3.8–4.8)
Alkaline Phosphatase: 112 IU/L (ref 44–121)
BUN/Creatinine Ratio: 11 (ref 10–24)
BUN: 15 mg/dL (ref 8–27)
Bilirubin Total: 0.4 mg/dL (ref 0.0–1.2)
CO2: 24 mmol/L (ref 20–29)
Calcium: 9.2 mg/dL (ref 8.6–10.2)
Chloride: 102 mmol/L (ref 96–106)
Creatinine, Ser: 1.33 mg/dL — ABNORMAL HIGH (ref 0.76–1.27)
Globulin, Total: 2.4 g/dL (ref 1.5–4.5)
Glucose: 102 mg/dL — ABNORMAL HIGH (ref 65–99)
Potassium: 4.5 mmol/L (ref 3.5–5.2)
Sodium: 139 mmol/L (ref 134–144)
Total Protein: 6.6 g/dL (ref 6.0–8.5)
eGFR: 58 mL/min/{1.73_m2} — ABNORMAL LOW (ref >59)

## 2020-10-11 LAB — VITAMIN D 25 HYDROXY (VIT D DEFICIENCY, FRACTURES): Vit D, 25-Hydroxy: 41 ng/mL (ref 30.0–100.0)

## 2020-10-11 LAB — TSH: TSH: 1.44 u[IU]/mL (ref 0.450–4.500)

## 2020-10-11 NOTE — Progress Notes (Signed)
   Mark Sandoval     MRN: [id number]      DOB: [birth year]   HPI Mark Sandoval is here to discuss the need for further eval of kidney function as he was recently advised by an MD that his is impaired. He also has questions re his renal cyst, wonders if this is contributing to reduced function also has not been imaged for over 1 year and concerned re possible growth ROS Denies recent fever or chills. Denies sinus pressure, nasal congestion, ear pain or sore throat. Denies chest congestion, productive cough or wheezing. Denies chest pains, palpitations and leg swelling Denies abdominal pain, nausea, vomiting,diarrhea or constipation.  No flank pain Denies dysuria, frequency,   Denies significant joint pain, swelling and limitation in mobility. Denies headaches, seizures, numbness, or tingling. Denies depression, anxiety or insomnia. Denies skin break down or rash.   PE  BP 130/88   Pulse 74   Temp 98.4 F (36.9 C)   Resp 18   Ht 6\' 2"  (1.88 m)   Wt 196 lb (88.9 kg)   SpO2 98%   BMI 25.16 kg/m   Patient alert and oriented and in no cardiopulmonary distress.  HEENT: No facial asymmetry, EOMI,     Neck supple .  Chest: Clear to auscultation bilaterally.  CVS: S1, S2 no murmurs, no S3.Regular rate.   Ext: No edema  MS: Adequate ROM spine, shoulders, hips and knees.   Psych: Good eye contact, normal affect. Memory intact not anxious or depressed appearing.  CNS: CN 2-12 intact, power,  normal throughout.no focal deficits noted.   Assessment & Plan  Essential hypertension Controlled, no change in medication DASH diet and commitment to daily physical activity for a minimum of 30 minutes discussed and encouraged, as a part of hypertension management. The importance of attaining a healthy weight is also discussed.  BP/Weight 10/10/2020 09/23/2020 09/21/2020 09/06/2020 08/17/2020 10/15/6201 08/19/9739  Systolic BP 638 453 646 803 212 248 250  Diastolic BP 88 99 91 91 86 78 88   Wt. (Lbs) 196 - 185 194 196.4 196 192  BMI 25.16 - 24.41 25.6 25.91 25.86 25.33       Renal cysts, acquired, bilateral Followed by Urology, asymptomatic  Leukocytopenia Followed by hematology , [facility]  Abnormal creatinine clearance glomerular filtration minimally decreased, nephrology to e/m  Abnormal blood creatinine level Minimally increased, HTN adequately controlled, denies NSAID use  Nephrology to E/M

## 2020-10-17 ENCOUNTER — Encounter: Payer: Self-pay | Admitting: Family Medicine

## 2020-10-17 DIAGNOSIS — R7989 Other specified abnormal findings of blood chemistry: Secondary | ICD-10-CM | POA: Insufficient documentation

## 2020-10-17 DIAGNOSIS — R944 Abnormal results of kidney function studies: Secondary | ICD-10-CM | POA: Insufficient documentation

## 2020-10-17 NOTE — Assessment & Plan Note (Signed)
minimally decreased, nephrology to e/m

## 2020-10-17 NOTE — Assessment & Plan Note (Signed)
Followed by hematology , [facility]

## 2020-10-17 NOTE — Assessment & Plan Note (Signed)
Controlled, no change in medication DASH diet and commitment to daily physical activity for a minimum of 30 minutes discussed and encouraged, as a part of hypertension management. The importance of attaining a healthy weight is also discussed.  BP/Weight 10/10/2020 09/23/2020 09/21/2020 09/06/2020 08/17/2020 09/21/5914 06/22/4663  Systolic BP 993 570 177 939 030 092 330  Diastolic BP 88 99 91 91 86 78 88  Wt. (Lbs) 196 - 185 194 196.4 196 192  BMI 25.16 - 24.41 25.6 25.91 25.86 25.33

## 2020-10-17 NOTE — Assessment & Plan Note (Signed)
Followed by Urology, asymptomatic

## 2020-10-17 NOTE — Assessment & Plan Note (Signed)
Minimally increased, HTN adequately controlled, denies NSAID use  Nephrology to E/M

## 2020-11-10 DIAGNOSIS — H3589 Other specified retinal disorders: Secondary | ICD-10-CM | POA: Diagnosis not present

## 2020-11-10 DIAGNOSIS — H43813 Vitreous degeneration, bilateral: Secondary | ICD-10-CM | POA: Diagnosis not present

## 2020-11-10 DIAGNOSIS — H25813 Combined forms of age-related cataract, bilateral: Secondary | ICD-10-CM | POA: Diagnosis not present

## 2021-01-01 DIAGNOSIS — Z23 Encounter for immunization: Secondary | ICD-10-CM | POA: Diagnosis not present

## 2021-01-04 ENCOUNTER — Ambulatory Visit: Payer: Medicare Other

## 2021-01-04 ENCOUNTER — Other Ambulatory Visit: Payer: Self-pay

## 2021-01-04 ENCOUNTER — Encounter: Payer: Self-pay | Admitting: Orthopedic Surgery

## 2021-01-04 ENCOUNTER — Ambulatory Visit (INDEPENDENT_AMBULATORY_CARE_PROVIDER_SITE_OTHER): Payer: Medicare Other | Admitting: Orthopedic Surgery

## 2021-01-04 VITALS — BP 144/94 | HR 65 | Ht 74.0 in | Wt 190.0 lb

## 2021-01-04 DIAGNOSIS — M25522 Pain in left elbow: Secondary | ICD-10-CM

## 2021-01-04 DIAGNOSIS — R2231 Localized swelling, mass and lump, right upper limb: Secondary | ICD-10-CM

## 2021-01-04 NOTE — Progress Notes (Signed)
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  He is does enjoy golf but reports no trauma.  The area in question was over the proximal forearm over the ulna.  He says it was quite large and was not consistent with his initial feeling that it was perhaps a bite of an insect.  Over time it has started to go down but he says his arm just does not feel exactly right  He denies numbness tingling weakness shoulder or neck pain or radiation  He does have a bone island in his left humerus at the distal portion which we have x-rayed as late as June 15, 2020 which showed no change in size shape and no bony destruction  There is been no evidence of significant or unwarranted weight loss night sweats or fever  His exam is essentially benign he has full range of motion of the elbow there is a slight prominence over the area in question the soft tissues are normal there is no epitrochlear lymphadenopathy the skin is intact the elbow is stable to hand function is normal there is no neurologic or vascular compromise to the extremity  X-rays of the area were negative I included a second set of films to get x-rays of the elbow to evaluate the lesion it was also the same size  Unclear etiology of the mass noted in the left forearm as it is decreasing in size and x-rays are negative  I just asked him to pay attention to his symptoms if they change or symptoms become more prominent to come back for reevaluation  Encounter Diagnoses  Name Primary?   Forearm mass, right Yes   Pain in left elbow

## 2021-01-09 ENCOUNTER — Encounter: Payer: Self-pay | Admitting: Family Medicine

## 2021-01-12 ENCOUNTER — Other Ambulatory Visit: Payer: Self-pay

## 2021-01-12 ENCOUNTER — Ambulatory Visit (INDEPENDENT_AMBULATORY_CARE_PROVIDER_SITE_OTHER): Payer: Medicare Other

## 2021-01-12 DIAGNOSIS — Z23 Encounter for immunization: Secondary | ICD-10-CM | POA: Diagnosis not present

## 2021-02-15 ENCOUNTER — Encounter: Payer: Self-pay | Admitting: Family Medicine

## 2021-02-16 ENCOUNTER — Other Ambulatory Visit: Payer: Self-pay

## 2021-02-16 DIAGNOSIS — E785 Hyperlipidemia, unspecified: Secondary | ICD-10-CM

## 2021-02-16 DIAGNOSIS — I10 Essential (primary) hypertension: Secondary | ICD-10-CM

## 2021-02-20 ENCOUNTER — Ambulatory Visit: Payer: Medicare Other | Admitting: Family Medicine

## 2021-02-21 ENCOUNTER — Other Ambulatory Visit (HOSPITAL_COMMUNITY)
Admission: RE | Admit: 2021-02-21 | Discharge: 2021-02-21 | Disposition: A | Discharge status: Home / Self Care | Payer: Medicare Other | Source: Ambulatory Visit | Attending: Family Medicine | Admitting: Family Medicine

## 2021-02-21 ENCOUNTER — Encounter: Payer: Self-pay | Admitting: Family Medicine

## 2021-02-21 DIAGNOSIS — I10 Essential (primary) hypertension: Secondary | ICD-10-CM | POA: Insufficient documentation

## 2021-02-21 DIAGNOSIS — E785 Hyperlipidemia, unspecified: Secondary | ICD-10-CM | POA: Insufficient documentation

## 2021-02-21 LAB — BASIC METABOLIC PANEL
Anion gap: 5 (ref 5–15)
BUN: 19 mg/dL (ref 8–23)
CO2: 27 mmol/L (ref 22–32)
Calcium: 8.8 mg/dL — ABNORMAL LOW (ref 8.9–10.3)
Chloride: 105 mmol/L (ref 98–111)
Creatinine, Ser: 1.35 mg/dL — ABNORMAL HIGH (ref 0.61–1.24)
GFR, Estimated: 56 mL/min — ABNORMAL LOW (ref >60)
Glucose, Bld: 92 mg/dL (ref 70–99)
Potassium: 4.1 mmol/L (ref 3.5–5.1)
Sodium: 137 mmol/L (ref 135–145)

## 2021-02-21 LAB — CBC
HCT: 41.5 % (ref 39.0–52.0)
Hemoglobin: 13.5 g/dL (ref 13.0–17.0)
MCH: 22.4 pg — ABNORMAL LOW (ref 26.0–34.0)
MCHC: 32.5 g/dL (ref 30.0–36.0)
MCV: 68.8 fL — ABNORMAL LOW (ref 80.0–100.0)
Platelets: 135 10*3/uL — ABNORMAL LOW (ref 150–400)
RBC: 6.03 MIL/uL — ABNORMAL HIGH (ref 4.22–5.81)
RDW: 15.9 % — ABNORMAL HIGH (ref 11.5–15.5)
WBC: 3.6 10*3/uL — ABNORMAL LOW (ref 4.0–10.5)
nRBC: 0 % (ref 0.0–0.2)

## 2021-02-21 LAB — LIPID PANEL
Cholesterol: 183 mg/dL (ref 0–200)
HDL: 50 mg/dL (ref >40)
LDL Cholesterol: 124 mg/dL — ABNORMAL HIGH (ref 0–99)
Total CHOL/HDL Ratio: 3.7 RATIO
Triglycerides: 43 mg/dL (ref <150)
VLDL: 9 mg/dL (ref 0–40)

## 2021-02-23 ENCOUNTER — Other Ambulatory Visit: Payer: Self-pay

## 2021-02-23 ENCOUNTER — Ambulatory Visit (INDEPENDENT_AMBULATORY_CARE_PROVIDER_SITE_OTHER): Payer: Medicare Other | Admitting: Family Medicine

## 2021-02-23 ENCOUNTER — Encounter: Payer: Self-pay | Admitting: Family Medicine

## 2021-02-23 VITALS — BP 131/89 | HR 80 | Resp 17 | Ht 74.0 in | Wt 192.0 lb

## 2021-02-23 DIAGNOSIS — R944 Abnormal results of kidney function studies: Secondary | ICD-10-CM | POA: Diagnosis not present

## 2021-02-23 DIAGNOSIS — D72819 Decreased white blood cell count, unspecified: Secondary | ICD-10-CM | POA: Diagnosis not present

## 2021-02-23 DIAGNOSIS — E785 Hyperlipidemia, unspecified: Secondary | ICD-10-CM

## 2021-02-23 DIAGNOSIS — D709 Neutropenia, unspecified: Secondary | ICD-10-CM | POA: Diagnosis not present

## 2021-02-23 DIAGNOSIS — N281 Cyst of kidney, acquired: Secondary | ICD-10-CM

## 2021-02-23 DIAGNOSIS — E559 Vitamin D deficiency, unspecified: Secondary | ICD-10-CM

## 2021-02-23 DIAGNOSIS — I10 Essential (primary) hypertension: Secondary | ICD-10-CM

## 2021-02-23 NOTE — Patient Instructions (Addendum)
F/u in 6 months, call if you need me sooner  Please get shingrix vaccines at your pharmacy  We will call/ message you later today re your Nephrology appt at [facility]  Fasting lipid, cmp and EGFr and CBC, TSH and vit D , draw at [facility] 1 week before appointment  Please eat calcium enriched food intentionally

## 2021-02-23 NOTE — Progress Notes (Signed)
P 

## 2021-02-23 NOTE — Progress Notes (Signed)
   Mark Sandoval     MRN: [id number]      DOB: 06/25/1950   HPI Mark Sandoval is here for follow up and re-evaluation of chronic medical conditions, medication management and review of any available recent lab and radiology data.  Preventive health is updated, specifically  Cancer screening and Immunization.   Still awaiting nephrology appt at [facility], has mild CKD and is concerned The PT denies any adverse reactions to current medications since the last visit.  There are no new concerns.  There are no specific complaints   ROS Denies recent fever or chills. Denies sinus pressure, nasal congestion, ear pain or sore throat. Denies chest congestion, productive cough or wheezing. Denies chest pains, palpitations and leg swelling Denies abdominal pain, nausea, vomiting,diarrhea or constipation.   Denies dysuria, frequency, hesitancy or incontinence. Denies joint pain, swelling and limitation in mobility. Denies headaches, seizures, numbness, or tingling. Denies depression, anxiety or insomnia. Denies skin break down or rash.   PE  BP 131/89   Pulse 80   Resp 17   Ht 6\' 2"  (1.88 m)   Wt 192 lb 0.6 oz (87.1 kg)   SpO2 98%   BMI 24.66 kg/m   Patient alert and oriented and in no cardiopulmonary distress.  HEENT: No facial asymmetry, EOMI,     Neck supple .  Chest: Clear to auscultation bilaterally.  CVS: S1, S2 no murmurs, no S3.Regular rate.  ABD: Soft non tender.   Ext: No edema  MS: Adequate ROM spine, shoulders, hips and knees.  Skin: Intact, no ulcerations or rash noted.  Psych: Good eye contact, normal affect. Memory intact not anxious or depressed appearing.  CNS: CN 2-12 intact, power,  normal throughout.no focal deficits noted.   Assessment & Plan  Abnormal creatinine clearance glomerular filtration Awaiting Nephrology eval at [place] hypertension Controlled, no change in medication DASH diet and commitment to daily physical activity for a  minimum of 30 minutes discussed and encouraged, as a part of hypertension management. The importance of attaining a healthy weight is also discussed.  BP/Weight 02/23/2021 01/04/2021 10/10/2020 09/23/2020 09/21/2020 2/77/4128 10/21/6765  Systolic BP 209 470 962 836 629 476 546  Diastolic BP 89 94 88 99 91 91 86  Wt. (Lbs) 192.04 190 196 - 185 194 196.4  BMI 24.66 24.39 25.16 - 24.41 25.6 25.91       Dyslipidemia, goal LDL below 100 Hyperlipidemia:Low fat diet discussed and encouraged.   Lipid Panel  Lab Results  Component Value Date   CHOL 183 02/21/2021   HDL 50 02/21/2021   LDLCALC 124 (H) 02/21/2021   TRIG 43 02/21/2021   CHOLHDL 3.7 02/21/2021     nees to reduce fat intake  Leukocytopenia Chronic , followed by hematology at [facility]  Renal cysts, acquired, bilateral Followed by Urology

## 2021-02-25 NOTE — Assessment & Plan Note (Signed)
Awaiting Nephrology eval at [facility]

## 2021-02-25 NOTE — Assessment & Plan Note (Signed)
Chronic , followed by hematology at [facility]

## 2021-02-25 NOTE — Assessment & Plan Note (Signed)
Controlled, no change in medication DASH diet and commitment to daily physical activity for a minimum of 30 minutes discussed and encouraged, as a part of hypertension management. The importance of attaining a healthy weight is also discussed.  BP/Weight 02/23/2021 01/04/2021 10/10/2020 09/23/2020 09/21/2020 5/45/6256 06/22/9371  Systolic BP 428 768 115 726 203 559 741  Diastolic BP 89 94 88 99 91 91 86  Wt. (Lbs) 192.04 190 196 - 185 194 196.4  BMI 24.66 24.39 25.16 - 24.41 25.6 25.91

## 2021-02-25 NOTE — Assessment & Plan Note (Signed)
Hyperlipidemia:Low fat diet discussed and encouraged.   Lipid Panel  Lab Results  Component Value Date   CHOL 183 02/21/2021   HDL 50 02/21/2021   LDLCALC 124 (H) 02/21/2021   TRIG 43 02/21/2021   CHOLHDL 3.7 02/21/2021     nees to reduce fat intake

## 2021-02-25 NOTE — Assessment & Plan Note (Signed)
Followed by Urology 

## 2021-03-27 ENCOUNTER — Ambulatory Visit: Payer: Medicare Other | Admitting: Family Medicine

## 2021-03-30 DIAGNOSIS — M47816 Spondylosis without myelopathy or radiculopathy, lumbar region: Secondary | ICD-10-CM | POA: Diagnosis not present

## 2021-03-30 DIAGNOSIS — M9903 Segmental and somatic dysfunction of lumbar region: Secondary | ICD-10-CM | POA: Diagnosis not present

## 2021-03-30 DIAGNOSIS — M9902 Segmental and somatic dysfunction of thoracic region: Secondary | ICD-10-CM | POA: Diagnosis not present

## 2021-03-30 DIAGNOSIS — S233XXA Sprain of ligaments of thoracic spine, initial encounter: Secondary | ICD-10-CM | POA: Diagnosis not present

## 2021-04-27 ENCOUNTER — Other Ambulatory Visit: Payer: Self-pay | Admitting: *Deleted

## 2021-04-27 ENCOUNTER — Encounter: Payer: Self-pay | Admitting: Family Medicine

## 2021-04-27 MED ORDER — AMLODIPINE BESYLATE 5 MG PO TABS: 5.0000 mg | ORAL_TABLET | Freq: Every day | ORAL | 3 refills | Status: DC

## 2021-06-14 DIAGNOSIS — N1832 Chronic kidney disease, stage 3b: Secondary | ICD-10-CM | POA: Diagnosis not present

## 2021-06-14 DIAGNOSIS — Z125 Encounter for screening for malignant neoplasm of prostate: Secondary | ICD-10-CM | POA: Diagnosis not present

## 2021-06-14 DIAGNOSIS — I10 Essential (primary) hypertension: Secondary | ICD-10-CM | POA: Diagnosis not present

## 2021-06-14 DIAGNOSIS — R972 Elevated prostate specific antigen [PSA]: Secondary | ICD-10-CM | POA: Diagnosis not present

## 2021-06-14 DIAGNOSIS — I129 Hypertensive chronic kidney disease with stage 1 through stage 4 chronic kidney disease, or unspecified chronic kidney disease: Secondary | ICD-10-CM | POA: Diagnosis not present

## 2021-06-15 ENCOUNTER — Ambulatory Visit (INDEPENDENT_AMBULATORY_CARE_PROVIDER_SITE_OTHER): Payer: Medicare Other | Admitting: Orthopedic Surgery

## 2021-06-15 ENCOUNTER — Encounter: Payer: Self-pay | Admitting: Orthopedic Surgery

## 2021-06-15 ENCOUNTER — Ambulatory Visit: Payer: Medicare Other

## 2021-06-15 ENCOUNTER — Other Ambulatory Visit: Payer: Self-pay

## 2021-06-15 VITALS — Ht 74.0 in | Wt 193.0 lb

## 2021-06-15 DIAGNOSIS — M25522 Pain in left elbow: Secondary | ICD-10-CM

## 2021-06-15 NOTE — Progress Notes (Signed)
Chief Complaint  ?Patient presents with  ? Elbow Pain  ?  Lt elbow   ? ?Encounter Diagnosis  ?Name Primary?  ? Pain in left elbow Yes  ? ? 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  He complains of no pain or soft tissue masses ? ?He has full range of motion of the elbow no lymph nodes are palpable ? ?Strength muscle tone normal no soft tissue masses palpated ? ?X-ray shows a bone island on the lateral side of the distal humerus near the capitellum it measures approximately 14.28 mm ? ?Dr. Woodfin Ganja would like to get this evaluated yearly as his mother died of multiple myeloma ? ?So we will see him in next year to x-rays elbow again ?

## 2021-07-06 DIAGNOSIS — R3912 Poor urinary stream: Secondary | ICD-10-CM | POA: Diagnosis not present

## 2021-07-06 DIAGNOSIS — N401 Enlarged prostate with lower urinary tract symptoms: Secondary | ICD-10-CM | POA: Diagnosis not present

## 2021-07-06 DIAGNOSIS — N281 Cyst of kidney, acquired: Secondary | ICD-10-CM | POA: Diagnosis not present

## 2021-08-08 DIAGNOSIS — Z23 Encounter for immunization: Secondary | ICD-10-CM | POA: Diagnosis not present

## 2021-08-15 ENCOUNTER — Encounter: Payer: Self-pay | Admitting: Family Medicine

## 2021-08-16 ENCOUNTER — Other Ambulatory Visit: Payer: Self-pay

## 2021-08-16 DIAGNOSIS — E785 Hyperlipidemia, unspecified: Secondary | ICD-10-CM

## 2021-08-16 DIAGNOSIS — I10 Essential (primary) hypertension: Secondary | ICD-10-CM

## 2021-08-21 ENCOUNTER — Other Ambulatory Visit (HOSPITAL_COMMUNITY)
Admission: RE | Admit: 2021-08-21 | Discharge: 2021-08-21 | Disposition: A | Discharge status: Home / Self Care | Payer: Medicare Other | Source: Ambulatory Visit | Attending: Family Medicine | Admitting: Family Medicine

## 2021-08-21 DIAGNOSIS — E785 Hyperlipidemia, unspecified: Secondary | ICD-10-CM | POA: Diagnosis not present

## 2021-08-21 DIAGNOSIS — I10 Essential (primary) hypertension: Secondary | ICD-10-CM | POA: Diagnosis not present

## 2021-08-21 LAB — CBC
HCT: 40.9 % (ref 39.0–52.0)
Hemoglobin: 13.4 g/dL (ref 13.0–17.0)
MCH: 22.5 pg — ABNORMAL LOW (ref 26.0–34.0)
MCHC: 32.8 g/dL (ref 30.0–36.0)
MCV: 68.6 fL — ABNORMAL LOW (ref 80.0–100.0)
Platelets: 175 10*3/uL (ref 150–400)
RBC: 5.96 MIL/uL — ABNORMAL HIGH (ref 4.22–5.81)
RDW: 15.9 % — ABNORMAL HIGH (ref 11.5–15.5)
WBC: 3.6 10*3/uL — ABNORMAL LOW (ref 4.0–10.5)
nRBC: 0 % (ref 0.0–0.2)

## 2021-08-21 LAB — BASIC METABOLIC PANEL
Anion gap: 6 (ref 5–15)
BUN: 15 mg/dL (ref 8–23)
CO2: 29 mmol/L (ref 22–32)
Calcium: 9 mg/dL (ref 8.9–10.3)
Chloride: 104 mmol/L (ref 98–111)
Creatinine, Ser: 1.36 mg/dL — ABNORMAL HIGH (ref 0.61–1.24)
GFR, Estimated: 56 mL/min — ABNORMAL LOW (ref >60)
Glucose, Bld: 97 mg/dL (ref 70–99)
Potassium: 4.6 mmol/L (ref 3.5–5.1)
Sodium: 139 mmol/L (ref 135–145)

## 2021-08-21 LAB — LIPID PANEL
Cholesterol: 172 mg/dL (ref 0–200)
HDL: 48 mg/dL (ref >40)
LDL Cholesterol: 113 mg/dL — ABNORMAL HIGH (ref 0–99)
Total CHOL/HDL Ratio: 3.6 RATIO
Triglycerides: 56 mg/dL (ref <150)
VLDL: 11 mg/dL (ref 0–40)

## 2021-08-24 ENCOUNTER — Encounter: Payer: Self-pay | Admitting: Family Medicine

## 2021-08-24 ENCOUNTER — Ambulatory Visit (INDEPENDENT_AMBULATORY_CARE_PROVIDER_SITE_OTHER): Payer: Medicare Other | Admitting: Family Medicine

## 2021-08-24 VITALS — BP 124/82 | HR 76 | Ht 74.0 in | Wt 189.0 lb

## 2021-08-24 DIAGNOSIS — I10 Essential (primary) hypertension: Secondary | ICD-10-CM

## 2021-08-24 DIAGNOSIS — R944 Abnormal results of kidney function studies: Secondary | ICD-10-CM | POA: Diagnosis not present

## 2021-08-24 DIAGNOSIS — D709 Neutropenia, unspecified: Secondary | ICD-10-CM | POA: Diagnosis not present

## 2021-08-24 DIAGNOSIS — E785 Hyperlipidemia, unspecified: Secondary | ICD-10-CM

## 2021-08-24 NOTE — Progress Notes (Signed)
? ?  Mark Sandoval     MRN: [id number]      DOB: [birth year] ? ? ?HPI ?Mark Sandoval is here for follow up and re-evaluation of chronic medical conditions, medication management and review of any available recent lab and radiology data.  ?Preventive health is updated, specifically  Cancer screening and Immunization.   ?Questions or concerns regarding consultations or procedures which the PT has had in the interim are  addressed. ?The PT denies any adverse reactions to current medications since the last visit.  ?Has changed eating habits and is distressed about LDL, however , has been overeating shrimp. Golfing and doing yard work, physical activity increased ? ?ROS ?Denies recent fever or chills. ?Denies sinus pressure, nasal congestion, ear pain or sore throat. ?Denies chest congestion, productive cough or wheezing. ?Denies chest pains, palpitations and leg swelling ?Denies abdominal pain, nausea, vomiting,diarrhea or constipation.   ?Denies dysuria, frequency, hesitancy or incontinence. ?Occasional knee pain after golfing, questions re safety of glucosamine / chondroitin which I checked and is safe ?Denies headaches, seizures, numbness, or tingling. ?Denies depression, anxiety or insomnia. ?Denies skin break down or rash. ? ? ?PE ? ?BP 124/82   Pulse 76   Ht '6\' 2"'$  (1.88 m)   Wt 189 lb 0.6 oz (85.7 kg)   SpO2 98%   BMI 24.27 kg/m?  ? ? ?Patient alert and oriented and in no cardiopulmonary distress. ? ?HEENT: No facial asymmetry, EOMI,     Neck supple . ? ?Chest: Clear to auscultation bilaterally. ? ?CVS: S1, S2 no murmurs, no S3.Regular rate. ? ?ABD: Soft non tender.  ? ?Ext: No edema ? ?MS: Adequate ROM spine, shoulders, hips and knees. ? ?Skin: Intact, no ulcerations or rash noted. ? ?Psych: Good eye contact, normal affect. Memory intact not anxious or depressed appearing. ? ?CNS: CN 2-12 intact, power,  normal throughout.no focal deficits noted. ? ? ?Assessment & Plan ? ?Essential hypertension ?Controlled,  no change in medication ?DASH diet and commitment to daily physical activity for a minimum of 30 minutes discussed and encouraged, as a part of hypertension management. ?The importance of attaining a healthy weight is also discussed. ? ? ?  08/24/2021  ?  8:44 AM 08/24/2021  ?  8:21 AM 06/15/2021  ?  8:51 AM 02/23/2021  ?  9:57 AM 01/04/2021  ?  8:27 AM 10/10/2020  ?  8:35 AM 10/10/2020  ?  7:59 AM  ?BP/Weight  ?Systolic BP 329 191  660 600 130 142  ?Diastolic BP 82 82  89 94 88 84  ?Wt. (Lbs)  189.04 193 192.04 190  196  ?BMI  24.27 kg/m2 24.78 kg/m2 24.66 kg/m2 24.39 kg/m2  25.16 kg/m2  ? ? ? ? ? ?Abnormal creatinine clearance glomerular filtration ?Now being followed by Nephrology at [facility] and he is extremely satisfied with his care ? ?Neutropenia ([place]) ?Stable and also followed by Hematology ? ?Dyslipidemia, goal LDL below 100 ?Hyperlipidemia:Low fat diet discussed and encouraged. ? ? ?Lipid Panel  ?Lab Results  ?Component Value Date  ? CHOL 172 08/21/2021  ? HDL 48 08/21/2021  ? LDLCALC 113 (H) 08/21/2021  ? TRIG 56 08/21/2021  ? CHOLHDL 3.6 08/21/2021  ? ? ? ?Needs to stop shrimp ? ?

## 2021-08-24 NOTE — Assessment & Plan Note (Signed)
Hyperlipidemia:Low fat diet discussed and encouraged. ? ? ?Lipid Panel  ?Lab Results  ?Component Value Date  ? CHOL 172 08/21/2021  ? HDL 48 08/21/2021  ? LDLCALC 113 (H) 08/21/2021  ? TRIG 56 08/21/2021  ? CHOLHDL 3.6 08/21/2021  ? ? ? ?Needs to stop shrimp ?

## 2021-08-24 NOTE — Patient Instructions (Addendum)
F/U in 6 months, call if you need me before,  ? ?Please get your shingrix vaccines ? ?Keep up great health habits  ? ?Please cut out the shrimp, then  LDL will be great ? ?Glucosamine/ chondroitin is safe to take with your medication ? ?Congrats on weight loss ? ?CBC, fasting lipid, cmp and EGR , tSH, Vit D 1 week before next visit ? ?Thanks for choosing [facility], we consider it a privelige to serve you. ? ?

## 2021-08-24 NOTE — Assessment & Plan Note (Signed)
Now being followed by Nephrology at [facility] and he is extremely satisfied with his care ?

## 2021-08-24 NOTE — Assessment & Plan Note (Signed)
Controlled, no change in medication ?DASH diet and commitment to daily physical activity for a minimum of 30 minutes discussed and encouraged, as a part of hypertension management. ?The importance of attaining a healthy weight is also discussed. ? ? ?  08/24/2021  ?  8:44 AM 08/24/2021  ?  8:21 AM 06/15/2021  ?  8:51 AM 02/23/2021  ?  9:57 AM 01/04/2021  ?  8:27 AM 10/10/2020  ?  8:35 AM 10/10/2020  ?  7:59 AM  ?BP/Weight  ?Systolic BP 299 242  683 419 130 142  ?Diastolic BP 82 82  89 94 88 84  ?Wt. (Lbs)  189.04 193 192.04 190  196  ?BMI  24.27 kg/m2 24.78 kg/m2 24.66 kg/m2 24.39 kg/m2  25.16 kg/m2  ? ? ? ? ?

## 2021-08-24 NOTE — Assessment & Plan Note (Signed)
Stable and also followed by Hematology ?

## 2021-10-12 ENCOUNTER — Ambulatory Visit (INDEPENDENT_AMBULATORY_CARE_PROVIDER_SITE_OTHER): Payer: Medicare Other | Admitting: Family Medicine

## 2021-10-12 ENCOUNTER — Encounter: Payer: Self-pay | Admitting: Family Medicine

## 2021-10-12 VITALS — BP 124/82 | HR 71 | Resp 18 | Ht 74.0 in | Wt 185.1 lb

## 2021-10-12 DIAGNOSIS — I10 Essential (primary) hypertension: Secondary | ICD-10-CM

## 2021-10-12 DIAGNOSIS — W57XXXA Bitten or stung by nonvenomous insect and other nonvenomous arthropods, initial encounter: Secondary | ICD-10-CM | POA: Insufficient documentation

## 2021-10-12 DIAGNOSIS — S30860A Insect bite (nonvenomous) of lower back and pelvis, initial encounter: Secondary | ICD-10-CM | POA: Diagnosis not present

## 2021-10-12 MED ORDER — DOXYCYCLINE HYCLATE 100 MG PO TABS: 100.0000 mg | ORAL_TABLET | Freq: Two times a day (BID) | ORAL | 1 refills | Status: DC

## 2021-10-12 NOTE — Patient Instructions (Addendum)
F/U AS BEFORE, CALL IF YOU NEED ME SOONER  1 WEEK OF ANTIBIOTIC IS PRESCRIBED, TAKE ENTIRE COURSE

## 2021-10-13 ENCOUNTER — Telehealth: Payer: Self-pay | Admitting: Family Medicine

## 2021-10-13 ENCOUNTER — Other Ambulatory Visit: Payer: Self-pay

## 2021-10-13 ENCOUNTER — Encounter: Payer: Self-pay | Admitting: Family Medicine

## 2021-10-13 MED ORDER — DOXYCYCLINE HYCLATE 100 MG PO TABS: 100.0000 mg | ORAL_TABLET | Freq: Two times a day (BID) | ORAL | 1 refills | Status: DC

## 2021-10-13 NOTE — Telephone Encounter (Signed)
Med has been sent to [facility]

## 2021-10-13 NOTE — Telephone Encounter (Signed)
Pt called stating that the medication he was given yesterday is out of stock at his pharmacy. He is wanting to know if you can please resend this to another pharmacy?  doxycycline (VIBRA-TABS) 100 MG tablet   [facility] [address]

## 2021-10-13 NOTE — Telephone Encounter (Signed)
Pt called stating that the prescription for [facility] needs to be cancelled doxycycline (VIBRA-TABS) 100 MG tablet. His insurance will not pay for him to get it at [facility] because it has been [facility] or filled at [facility] when they told him the medication was on back order till 7/13  & he does not want to wait that long for an abx. Can [facility]? Pt said can you please contact him if there is any questions.

## 2021-10-13 NOTE — Telephone Encounter (Signed)
Left voicemail (could not speak to anyone) and told them to cancel the doxycycline from this office

## 2021-10-17 ENCOUNTER — Encounter: Payer: Self-pay | Admitting: Family Medicine

## 2021-10-17 NOTE — Assessment & Plan Note (Signed)
Controlled, no change in medication  

## 2021-10-17 NOTE — Assessment & Plan Note (Signed)
1 week doxycycline prescribed and reviewed general measures to take to reduce recurrence

## 2021-10-17 NOTE — Progress Notes (Signed)
   Mark Sandoval     MRN: [id number]      DOB: 1950-12-14   HPI Mark Sandoval is here c/o discomfort on buttock for approximately 1 week No fever, chills, myalgias Wife successfully removed live tick from area last night. Has photo os live tick entoire specimen which was removed  C/o redness and warmth in the area ROS Denies recent fever or chills. See HPI   PE  BP 124/82   Pulse 71   Resp 18   Ht '6\' 2"'$  (1.88 m)   Wt 185 lb 1.9 oz (84 kg)   SpO2 97%   BMI 23.77 kg/m   Patient alert and oriented and in no cardiopulmonary distress.  HEENT: No facial asymmetry, EOMI,     Neck supple .  [name]   Ext: No edema  MS: Adequate ROM spine, shoulders, hips and knees.  Skin: tick bite not examined  CNS: CN 2-12 intact, t.no focal deficits noted.   Assessment & Plan  Tick bite of buttock 1 week doxycycline prescribed and reviewed general measures to take to reduce recurrence  Essential hypertension Controlled, no change in medication

## 2021-11-08 ENCOUNTER — Ambulatory Visit (INDEPENDENT_AMBULATORY_CARE_PROVIDER_SITE_OTHER): Payer: Medicare Other

## 2021-11-08 DIAGNOSIS — Z0000 Encounter for general adult medical examination without abnormal findings: Secondary | ICD-10-CM | POA: Diagnosis not present

## 2021-11-08 NOTE — Progress Notes (Signed)
Subjective:   Mark Sandoval is a 71 y.o. male who presents for Medicare Annual/Subsequent preventive examination.  Review of Systems     Cardiac Risk Factors include: advanced age (>51mn, >>46women);dyslipidemia     Objective:    There were no vitals filed for this visit. There is no height or weight on file to calculate BMI.     11/08/2021    4:01 PM 09/23/2020    6:20 AM 09/21/2020    8:49 AM 02/04/2020    8:24 AM 04/10/2018    6:28 AM 01/30/2018    9:30 AM 05/25/2016    8:01 AM  Advanced Directives  Does Patient Have a Medical Advance Directive? No No Yes No Yes Yes Yes  Type of AComptrollerLiving will  [place] will    Does patient want to make changes to medical advance directive?  No - Patient declined No - Patient declined  No - Patient declined    Copy of [place] Chart?   No - copy requested  No - copy requested    Would patient like information on creating a medical advance directive? Yes (ED - Information included in AVS)   Yes (ED - Information included in AVS)       Current Medications (verified) Outpatient Encounter Medications as of 11/08/2021  Medication Sig   amLODipine (NORVASC) 5 MG tablet Take 1 tablet (5 mg total) by mouth daily.   doxycycline (VIBRA-TABS) 100 MG tablet Take 1 tablet (100 mg total) by mouth 2 (two) times daily.   Multiple Vitamin (MULTIVITAMIN) tablet Take 1 tablet by mouth daily.   No facility-administered encounter medications on file as of 11/08/2021.    Allergies (verified) Compazine and Mark Sandoval   History: Past Medical History:  Diagnosis Date   Dyslipidemia    GERD (gastroesophageal reflux disease)    History of seasonal allergies    Hypertension    Leukopenia    Lump or mass in breast    benign   Other spondylosis with radiculopathy, cervical region    Thrombocytopenia (Mark Sandoval    Past Surgical History:  Procedure Laterality  Date   athroscopy of left knee  2002   CHOLECYSTECTOMY  2007   COLONOSCOPY N/A 03/29/2016   Procedure: COLONOSCOPY;  Surgeon: NRogene Houston MD;  Location: [facility];  Service: Endoscopy;  Laterality: N/A;  1[place]N/A 09/23/2020   Procedure: EXCISION LIPOMA; abdominal wall;  Surgeon: JAviva Signs MD;  Location: [facility];  Service: General;  Laterality: N/A;   removal of benign left breast lump  09/06/07   Family History  Problem Relation Age of Onset   Cancer Mother        hematologic cancer    Hypertension Mother    Alcohol abuse Father    Social History   Socioeconomic History   Marital status: Married    Spouse name: Not on file   Number of children: 4   Years of education: DDS   Highest education level: Not on file  Occupational History   Occupation: dOrthoptist SELF-EMPLOYED    Comment: retired   Tobacco Use   Smoking status: Never   Smokeless tobacco: Never  Substance and Sexual Activity   Alcohol use: Yes    Alcohol/week: 0.0 standard drinks of alcohol    Comment: occasional   Drug use: No   Sexual activity: Yes  Other Topics Concern   Not on file  Social History Narrative   Not on file   Social Determinants of Health   Financial Resource Strain: Low Risk  (11/05/2021)   Overall Financial Resource Strain (CARDIA)    Difficulty of Paying Living Expenses: Not hard at all  Food Insecurity: No Food Insecurity (11/05/2021)   Hunger Vital Sign    Worried About Running Out of Food in the Last Year: Never true    Ran Out of Food in the Last Year: Never true  Transportation Needs: No Transportation Needs (11/05/2021)   PRAPARE - Hydrologist (Medical): No    Lack of Transportation (Non-Medical): No  Physical Activity: Sufficiently Active (11/05/2021)   Exercise Vital Sign    Days of Exercise per Week: 4 days    Minutes of Exercise per Session: 150+ min  Stress: No Stress Concern Present (11/05/2021)    [place]    Feeling of Stress : Not at all  Social Connections: Unknown (11/05/2021)   Social Connection and Isolation Panel [NHANES]    Frequency of Communication with Friends and Family: More than three times a week    Frequency of Social Gatherings with Friends and Family: Once a week    Attends Religious Services: Not on Advertising copywriter or Organizations: Yes    Attends Music therapist: More than 4 times per year    Marital Status: Married    Tobacco Counseling Counseling given: Not Answered   Clinical Intake:  Pre-visit preparation completed: Yes  Pain : No/denies pain     Diabetes: No  How often do you need to have someone help you when you read instructions, pamphlets, or other written materials from your doctor or pharmacy?: 1 - Never  [place]         Activities of Daily Living    11/08/2021    3:52 PM 11/05/2021   10:24 AM  In your present state of health, do you have any difficulty performing the following activities:  Hearing? 0 0  Vision? 0 0  Difficulty concentrating or making decisions? 0 0  Walking or climbing stairs? 0 0  Dressing or bathing? 0 0  Doing errands, shopping? 0 0  Preparing Food and eating ? N N  Using the Toilet? N N  In the past six months, have you accidently leaked urine? N N  Do you have problems with loss of bowel control? N N  Managing your Medications? N N  Managing your Finances? N N  Housekeeping or managing your Housekeeping? N     Patient Care Team: Fayrene Helper, MD as PCP - General  Indicate any recent Medical Services you may have received from other than [facility] providers in the past year (date may be approximate).     Assessment:   This is a routine wellness examination for Mark Sandoval.  Hearing/Vision screen No results found.  Dietary issues and exercise activities discussed: Current Exercise Habits: Home  exercise routine, Time (Minutes): 60, Frequency (Times/Week): 4, Weekly Exercise (Minutes/Week): 240, Intensity: Moderate   Goals Addressed             This Visit's Progress    Prevent falls       Keep active and playing golf as able. Good for you!       Depression Screen    10/12/2021    1:53 PM 08/24/2021  8:22 AM 02/23/2021    9:58 AM 10/10/2020    8:01 AM 06/16/2020    1:29 PM 02/04/2020    8:33 AM 02/04/2020    8:17 AM  PHQ 2/9 Scores  PHQ - 2 Score 0 0 0 0 0 0 0  PHQ- 9 Score   0        Fall Risk    11/05/2021   10:24 AM 10/12/2021    1:53 PM 08/24/2021    8:22 AM 02/23/2021    9:57 AM 10/10/2020    8:01 AM  Fall Risk   Falls in the past year? 0 0 0 0 0  Number falls in past yr: 0 0 0  0  Injury with Fall? 0 0 0  0  Risk for fall due to :  No Fall Risks No Fall Risks  No Fall Risks  Follow up   Falls evaluation completed  Falls evaluation completed    FALL RISK PREVENTION PERTAINING TO THE HOME:  Any stairs in or around the home? Yes  If so, are there any without handrails? No  Home free of loose throw rugs in walkways, pet beds, electrical cords, etc? No  Adequate lighting in your home to reduce risk of falls? Yes   ASSISTIVE DEVICES UTILIZED TO PREVENT FALLS:  Life alert? No  Use of a cane, walker or w/c? No  Grab bars in the bathroom? No  Shower chair or bench in shower? Yes  Elevated toilet seat or a handicapped toilet? No    Cognitive Function:        02/04/2020    8:18 AM 02/04/2020    8:17 AM 02/03/2019    8:11 AM 01/30/2018    9:32 AM  6CIT Screen  What Year? 0 points 0 points 0 points 0 points  What month? 0 points 0 points 0 points 0 points  What time? 0 points 0 points 0 points 0 points  Count back from 20 0 points  0 points 0 points  Months in reverse 0 points  0 points 2 points  Repeat phrase 0 points  0 points 0 points  Total Score 0 points  0 points 2 points    Immunizations Immunization History  Administered Date(s)  Administered   Fluad Quad(high Dose 65+) 12/15/2018, 02/03/2020, 01/12/2021   H1N1 02/06/2008   Influenza Split 02/02/2011, 02/15/2012   Influenza Whole 01/17/2007, 01/13/2010   Influenza, High Dose Seasonal PF 01/10/2018   Influenza,inj,Quad PF,6+ Mos 01/15/2014, 02/02/2015, 01/05/2016, 01/01/2017   Influenza-Unspecified 01/26/2016   Moderna SARS-COV2 Booster Vaccination 08/08/2021   PFIZER(Purple Top)SARS-COV-2 Vaccination 05/21/2019, 06/15/2019, 01/04/2020, 07/19/2020, 01/01/2021   Pneumococcal Conjugate-13 01/26/2016   Pneumococcal Polysaccharide-23 07/31/2017   Td 05/03/2006   Tdap 01/01/2017   Zoster, Live 02/02/2011    TDAP status: Up to date  Flu Vaccine status: Up to date  Pneumococcal vaccine status: Up to date  Covid-19 vaccine status: Completed vaccines  Qualifies for Shingles Vaccine? Yes   Zostavax completed Yes   Shingrix Completed?: No.    Education has been provided regarding the importance of this vaccine. Patient has been advised to call insurance company to determine out of pocket expense if they have not yet received this vaccine. Advised may also receive vaccine at local pharmacy or Health Dept. Verbalized acceptance and understanding.  Screening Tests Health Maintenance  Topic Date Due   Zoster Vaccines- Shingrix (1 of 2) Never done   COVID-19 Vaccine (6 - Booster for [employer] series) 10/03/2021   INFLUENZA  VACCINE  11/14/2021   COLONOSCOPY (Pts 45-46yr Insurance coverage will need to be confirmed)  03/29/2026   TETANUS/TDAP  01/02/2027   Pneumonia Vaccine 71 Years old  Completed   Hepatitis C Screening  Completed   HPV VACCINES  Aged Out    Health Maintenance  Health Maintenance Due  Topic Date Due   Zoster Vaccines- Shingrix (1 of 2) Never done   COVID-19 Vaccine (6 - Booster for Pfizer series) 10/03/2021    Colorectal cancer screening: Type of screening: Colonoscopy. Completed yes. Repeat every 10 years  Lung Cancer Screening: (Low Dose CT  Chest recommended if Age 71-80years, 30 pack-year currently smoking OR have quit w/in 15years.) does not qualify.   Lung Cancer Screening Referral: na  Additional Screening:  Hepatitis C Screening: does qualify; Completed   Vision Screening: Recommended annual ophthalmology exams for early detection of glaucoma and other disorders of the eye. Is the patient up to date with their annual eye exam?  Yes  Who is the provider or what is the name of the office in which the patient attends annual eye exams?  If pt is not established with a provider, would they like to be referred to a provider to establish care? No .   Dental Screening: Recommended annual dental exams for proper oral hygiene  Community Resource Referral / Chronic Care Management: CRR required this visit?  No   CCM required this visit?  No      Plan:     I have personally reviewed and noted the following in the patient's chart:   Medical and social history Use of alcohol, tobacco or illicit drugs  Current medications and supplements including opioid prescriptions. Patient is not currently taking opioid prescriptions. Functional ability and status Nutritional status Physical activity Advanced directives List of other physicians Hospitalizations, surgeries, and ER visits in previous 12 months Vitals Screenings to include cognitive, depression, and falls Referrals and appointments  In addition, I have reviewed and discussed with patient certain preventive protocols, quality metrics, and best practice recommendations. A written personalized care plan for preventive services as well as general preventive health recommendations were provided to patient.     BEual Fines LPN   76/22/2979  Nurse Notes: Schedule your next wellness visit for 1 year at checkout

## 2021-11-08 NOTE — Patient Instructions (Addendum)
  [name] , Thank you for taking time to come for your Medicare Wellness Visit. I appreciate your ongoing commitment to your health goals. Please review the following plan we discussed and let me know if I can assist you in the future.   These are the goals we discussed:  Goals      Prevent falls     Keep active and playing golf as able. Good for you!        This is a list of the screening recommended for you and due dates:  Health Maintenance  Topic Date Due   Zoster (Shingles) Vaccine (1 of 2) Never done   COVID-19 Vaccine (6 - Booster for Pfizer series) 10/03/2021   Flu Shot  11/14/2021   Colon Cancer Screening  03/29/2026   Tetanus Vaccine  01/02/2027   Pneumonia Vaccine  Completed   Hepatitis C Screening: USPSTF Recommendation to screen - Ages 18-79 yo.  Completed   HPV Vaccine  Aged Out

## 2021-11-14 DIAGNOSIS — D709 Neutropenia, unspecified: Secondary | ICD-10-CM | POA: Diagnosis not present

## 2021-11-14 DIAGNOSIS — D696 Thrombocytopenia, unspecified: Secondary | ICD-10-CM | POA: Diagnosis not present

## 2021-11-14 DIAGNOSIS — R718 Other abnormality of red blood cells: Secondary | ICD-10-CM | POA: Diagnosis not present

## 2021-11-14 DIAGNOSIS — D582 Other hemoglobinopathies: Secondary | ICD-10-CM | POA: Diagnosis not present

## 2021-11-14 NOTE — Progress Notes (Signed)
I connected with  Mark Sandoval on 11/14/21 by a audio enabled telemedicine application and verified that I am speaking with the correct person using two identifiers.  Patient Location: Home  Provider Location: Office/Clinic  I discussed the limitations of evaluation and management by telemedicine. The patient expressed understanding and agreed to proceed.

## 2021-12-08 ENCOUNTER — Encounter: Payer: Self-pay | Admitting: Family Medicine

## 2021-12-11 ENCOUNTER — Other Ambulatory Visit: Payer: Self-pay | Admitting: Family Medicine

## 2021-12-11 DIAGNOSIS — L989 Disorder of the skin and subcutaneous tissue, unspecified: Secondary | ICD-10-CM

## 2021-12-20 DIAGNOSIS — I10 Essential (primary) hypertension: Secondary | ICD-10-CM | POA: Diagnosis not present

## 2021-12-20 DIAGNOSIS — Z23 Encounter for immunization: Secondary | ICD-10-CM | POA: Diagnosis not present

## 2021-12-20 DIAGNOSIS — I129 Hypertensive chronic kidney disease with stage 1 through stage 4 chronic kidney disease, or unspecified chronic kidney disease: Secondary | ICD-10-CM | POA: Diagnosis not present

## 2021-12-20 DIAGNOSIS — N1832 Chronic kidney disease, stage 3b: Secondary | ICD-10-CM | POA: Diagnosis not present

## 2021-12-20 DIAGNOSIS — R809 Proteinuria, unspecified: Secondary | ICD-10-CM | POA: Diagnosis not present

## 2021-12-20 DIAGNOSIS — D472 Monoclonal gammopathy: Secondary | ICD-10-CM | POA: Diagnosis not present

## 2021-12-25 DIAGNOSIS — N401 Enlarged prostate with lower urinary tract symptoms: Secondary | ICD-10-CM | POA: Diagnosis not present

## 2021-12-28 ENCOUNTER — Ambulatory Visit: Payer: Medicare Other | Admitting: Orthopedic Surgery

## 2021-12-29 ENCOUNTER — Encounter: Payer: Self-pay | Admitting: Orthopedic Surgery

## 2021-12-29 ENCOUNTER — Ambulatory Visit (INDEPENDENT_AMBULATORY_CARE_PROVIDER_SITE_OTHER): Payer: Medicare Other | Admitting: Orthopedic Surgery

## 2021-12-29 VITALS — BP 118/70 | HR 76 | Ht 74.0 in | Wt 178.0 lb

## 2021-12-29 DIAGNOSIS — S56109A Unspecified injury of flexor muscle, fascia and tendon of unspecified finger at forearm level, initial encounter: Secondary | ICD-10-CM

## 2021-12-29 NOTE — Progress Notes (Signed)
Chief Complaint  Patient presents with   Hand Pain    Pt states he pulled up a strap with his left hand and felt a pulling pain from his middle and ring finger that ran into his forearm approx 2 wks ago. 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 felt intense pain in his left forearm and then noted that when he tried to hold something in his wrist of any weight he would have to drop it comes in with improving symptoms with pain in the forearm  Past Medical History:  Diagnosis Date   Dyslipidemia    GERD (gastroesophageal reflux disease)    History of seasonal allergies    Hypertension    Leukopenia    Lump or mass in breast    benign   Other spondylosis with radiculopathy, cervical region    Thrombocytopenia (H[facility])     BP 118/70   Pulse 76   Ht '6\' 2"'$  (1.88 m)   Wt 178 lb (80.7 kg)   BMI 22.85 kg/m    General appearance: Well-developed well-nourished no gross deformities  Cardiovascular normal  Neurologically sensation normal   Psychological: Awake alert and oriented x3 mood and affect normal  Skin normal   Musculoskeletal: Left forearm: All tendons are working all flexor tendons work normally L Passenger transport manager work normally.  Patient did have pain with resistance of wrist flexion and the pain was in the forearm.  Palmaris longus seem to be intact but the muscle belly seem to be involved  Imaging none indicated  A/P  Encounter Diagnosis  Name Primary?   Injury of flexor muscle of finger at forearm level Yes    Should be self-limiting follow-up as needed

## 2022-01-04 ENCOUNTER — Encounter: Payer: Self-pay | Admitting: Family Medicine

## 2022-01-05 DIAGNOSIS — Z23 Encounter for immunization: Secondary | ICD-10-CM | POA: Diagnosis not present

## 2022-01-08 DIAGNOSIS — Z23 Encounter for immunization: Secondary | ICD-10-CM | POA: Diagnosis not present

## 2022-01-25 DIAGNOSIS — D472 Monoclonal gammopathy: Secondary | ICD-10-CM | POA: Diagnosis not present

## 2022-02-01 DIAGNOSIS — D472 Monoclonal gammopathy: Secondary | ICD-10-CM | POA: Diagnosis not present

## 2022-02-09 DIAGNOSIS — D472 Monoclonal gammopathy: Secondary | ICD-10-CM | POA: Diagnosis not present

## 2022-02-14 ENCOUNTER — Encounter: Payer: Self-pay | Admitting: Family Medicine

## 2022-02-15 ENCOUNTER — Other Ambulatory Visit: Payer: Self-pay | Admitting: Family Medicine

## 2022-02-15 ENCOUNTER — Other Ambulatory Visit (HOSPITAL_COMMUNITY)
Admission: RE | Admit: 2022-02-15 | Discharge: 2022-02-15 | Disposition: A | Discharge status: Home / Self Care | Payer: Medicare Other | Source: Ambulatory Visit | Attending: Family Medicine | Admitting: Family Medicine

## 2022-02-15 DIAGNOSIS — I10 Essential (primary) hypertension: Secondary | ICD-10-CM

## 2022-02-15 DIAGNOSIS — E559 Vitamin D deficiency, unspecified: Secondary | ICD-10-CM

## 2022-02-15 DIAGNOSIS — E785 Hyperlipidemia, unspecified: Secondary | ICD-10-CM

## 2022-02-15 DIAGNOSIS — D72819 Decreased white blood cell count, unspecified: Secondary | ICD-10-CM | POA: Insufficient documentation

## 2022-02-15 LAB — COMPREHENSIVE METABOLIC PANEL
ALT: 23 U/L (ref 0–44)
AST: 25 U/L (ref 15–41)
Albumin: 4.2 g/dL (ref 3.5–5.0)
Alkaline Phosphatase: 77 U/L (ref 38–126)
Anion gap: 7 (ref 5–15)
BUN: 19 mg/dL (ref 8–23)
CO2: 29 mmol/L (ref 22–32)
Calcium: 9.1 mg/dL (ref 8.9–10.3)
Chloride: 103 mmol/L (ref 98–111)
Creatinine, Ser: 1.38 mg/dL — ABNORMAL HIGH (ref 0.61–1.24)
GFR, Estimated: 55 mL/min — ABNORMAL LOW (ref >60)
Glucose, Bld: 97 mg/dL (ref 70–99)
Potassium: 4.5 mmol/L (ref 3.5–5.1)
Sodium: 139 mmol/L (ref 135–145)
Total Bilirubin: 1 mg/dL (ref 0.3–1.2)
Total Protein: 6.9 g/dL (ref 6.5–8.1)

## 2022-02-15 LAB — CBC WITH DIFFERENTIAL/PLATELET
Abs Immature Granulocytes: 0.01 10*3/uL (ref 0.00–0.07)
Basophils Absolute: 0.1 10*3/uL (ref 0.0–0.1)
Basophils Relative: 2 %
Eosinophils Absolute: 0.1 10*3/uL (ref 0.0–0.5)
Eosinophils Relative: 3 %
HCT: 40.9 % (ref 39.0–52.0)
Hemoglobin: 13.5 g/dL (ref 13.0–17.0)
Immature Granulocytes: 0 %
Lymphocytes Relative: 33 %
Lymphs Abs: 1 10*3/uL (ref 0.7–4.0)
MCH: 22.3 pg — ABNORMAL LOW (ref 26.0–34.0)
MCHC: 33 g/dL (ref 30.0–36.0)
MCV: 67.5 fL — ABNORMAL LOW (ref 80.0–100.0)
Monocytes Absolute: 0.3 10*3/uL (ref 0.1–1.0)
Monocytes Relative: 9 %
Neutro Abs: 1.7 10*3/uL (ref 1.7–7.7)
Neutrophils Relative %: 53 %
Platelets: 146 10*3/uL — ABNORMAL LOW (ref 150–400)
RBC: 6.06 MIL/uL — ABNORMAL HIGH (ref 4.22–5.81)
RDW: 16.5 % — ABNORMAL HIGH (ref 11.5–15.5)
WBC: 3.2 10*3/uL — ABNORMAL LOW (ref 4.0–10.5)
nRBC: 0 % (ref 0.0–0.2)

## 2022-02-15 LAB — LIPID PANEL
Cholesterol: 190 mg/dL (ref 0–200)
HDL: 63 mg/dL (ref >40)
LDL Cholesterol: 121 mg/dL — ABNORMAL HIGH (ref 0–99)
Total CHOL/HDL Ratio: 3 RATIO
Triglycerides: 31 mg/dL (ref <150)
VLDL: 6 mg/dL (ref 0–40)

## 2022-02-15 LAB — VITAMIN D 25 HYDROXY (VIT D DEFICIENCY, FRACTURES): Vit D, 25-Hydroxy: 38.49 ng/mL (ref 30–100)

## 2022-02-15 LAB — TSH: TSH: 1.361 u[IU]/mL (ref 0.350–4.500)

## 2022-02-27 ENCOUNTER — Ambulatory Visit (INDEPENDENT_AMBULATORY_CARE_PROVIDER_SITE_OTHER): Payer: Medicare Other | Admitting: Family Medicine

## 2022-02-27 ENCOUNTER — Encounter: Payer: Self-pay | Admitting: Family Medicine

## 2022-02-27 VITALS — BP 124/70 | HR 80 | Ht 74.0 in | Wt 175.0 lb

## 2022-02-27 DIAGNOSIS — D472 Monoclonal gammopathy: Secondary | ICD-10-CM | POA: Insufficient documentation

## 2022-02-27 DIAGNOSIS — I10 Essential (primary) hypertension: Secondary | ICD-10-CM | POA: Diagnosis not present

## 2022-02-27 DIAGNOSIS — E785 Hyperlipidemia, unspecified: Secondary | ICD-10-CM | POA: Diagnosis not present

## 2022-02-27 DIAGNOSIS — R944 Abnormal results of kidney function studies: Secondary | ICD-10-CM

## 2022-02-27 MED ORDER — AMLODIPINE BESYLATE 5 MG PO TABS: 5.0000 mg | ORAL_TABLET | Freq: Every day | ORAL | 3 refills | Status: DC

## 2022-02-27 NOTE — Patient Instructions (Addendum)
F/U in 6 months call if you need me sooner.  No changes in medication.  Continue excellent health habits.  Please do get your Shingrix vaccines.  Thanks for choosing [facility], we consider it a privelige to serve you.  Best for 2024!  Fasting CBC, lipid, cmp and eGFr, 1 week before next visit

## 2022-02-27 NOTE — Assessment & Plan Note (Signed)
NEW DX IN 01/2022, BEING MANAGED AT [facility]

## 2022-02-27 NOTE — Assessment & Plan Note (Signed)
Controlled, no change in medication DASH diet and commitment to daily physical activity for a minimum of 30 minutes discussed and encouraged, as a part of hypertension management. The importance of attaining a healthy weight is also discussed.     02/27/2022    8:35 AM 02/27/2022    8:09 AM 12/29/2021   10:13 AM 10/12/2021    2:11 PM 10/12/2021    1:51 PM 08/24/2021    8:44 AM 08/24/2021    8:21 AM  BP/Weight  Systolic BP 048 889 169 450 388 828 003  Diastolic BP 70 77 70 82 83 82 82  Wt. (Lbs)  175.04 178  185.12  189.04  BMI  22.47 kg/m2 22.85 kg/m2  23.77 kg/m2  24.27 kg/m2

## 2022-02-27 NOTE — Assessment & Plan Note (Signed)
Hyperlipidemia:Low fat diet discussed and encouraged.   Lipid Panel  Lab Results  Component Value Date   CHOL 190 02/15/2022   HDL 63 02/15/2022   LDLCALC 121 (H) 02/15/2022   TRIG 31 02/15/2022   CHOLHDL 3.0 02/15/2022     IMPROVE EXERCISE AND CONTINUE LOW FAT PLANT BASED DIET

## 2022-02-27 NOTE — Assessment & Plan Note (Signed)
FOLLOWED BY NEPHROLOGY AT [facility]

## 2022-02-27 NOTE — Progress Notes (Signed)
   Mark Sandoval     MRN: [id number]      DOB: May 31, 1950   HPI Mark Sandoval is here for follow up and re-evaluation of chronic medical conditions, medication management and review of any available recent lab and radiology data.  Preventive health is updated, specifically  Cancer screening and Immunization.   New dx of MGUS, states he was told the likelihood of him developing malignancy is less than 2 % in the next 20 years Has a good appetite, has changed food choice with a 20 pound weight loss, reports few light headed episodes and has concerns abpout blood pressure Plans to increase exercise ROS Denies recent fever or chills. Denies sinus pressure, nasal congestion, ear pain or sore throat. Denies chest congestion, productive cough or wheezing. Denies chest pains, palpitations and leg swelling Denies abdominal pain, nausea, vomiting,diarrhea or constipation.   Denies dysuria, frequency, hesitancy or incontinence. Denies significant joint pain, swelling and limitation in mobility. Denies headaches, seizures, numbness, or tingling. Denies depression, anxiety or insomnia. Denies skin break down or rash.   PE  BP 124/70   Pulse 80   Ht '6\' 2"'$  (1.88 m)   Wt 175 lb 0.6 oz (79.4 kg)   SpO2 98%   BMI 22.47 kg/m   Patient alert and oriented and in no cardiopulmonary distress.  HEENT: No facial asymmetry, EOMI,     Neck supple .  Chest: Clear to auscultation bilaterally.  CVS: S1, S2 no murmurs, no S3.Regular rate.    Ext: No edema  MS: Adequate ROM spine, shoulders, hips and knees.  Skin: Intact, no ulcerations or rash noted.  Psych: Good eye contact, normal affect. Memory intact not anxious or depressed appearing.  CNS: CN 2-12 intact, power,  normal throughout.no focal deficits noted.   Assessment & Plan  Essential hypertension Controlled, no change in medication DASH diet and commitment to daily physical activity for a minimum of 30 minutes discussed and  encouraged, as a part of hypertension management. The importance of attaining a healthy weight is also discussed.     02/27/2022    8:35 AM 02/27/2022    8:09 AM 12/29/2021   10:13 AM 10/12/2021    2:11 PM 10/12/2021    1:51 PM 08/24/2021    8:44 AM 08/24/2021    8:21 AM  BP/Weight  Systolic BP 681 157 262 035 597 416 384  Diastolic BP 70 77 70 82 83 82 82  Wt. 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   Lipid Panel  Lab Results  Component Value Date   CHOL 190 02/15/2022   HDL 63 02/15/2022   LDLCALC 121 (H) 02/15/2022   TRIG 31 02/15/2022   CHOLHDL 3.0 02/15/2022     IMPROVE EXERCISE AND CONTINUE LOW FAT PLANT BASED DIET  Abnormal creatinine clearance glomerular filtration FOLLOWED BY NEPHROLOGY AT [facility]

## 2022-04-24 ENCOUNTER — Encounter: Payer: Self-pay | Admitting: Family Medicine

## 2022-05-10 DIAGNOSIS — N138 Other obstructive and reflux uropathy: Secondary | ICD-10-CM | POA: Diagnosis not present

## 2022-05-10 DIAGNOSIS — R809 Proteinuria, unspecified: Secondary | ICD-10-CM | POA: Diagnosis not present

## 2022-05-10 DIAGNOSIS — D704 Cyclic neutropenia: Secondary | ICD-10-CM | POA: Diagnosis not present

## 2022-05-10 DIAGNOSIS — R972 Elevated prostate specific antigen [PSA]: Secondary | ICD-10-CM | POA: Diagnosis not present

## 2022-05-10 DIAGNOSIS — N401 Enlarged prostate with lower urinary tract symptoms: Secondary | ICD-10-CM | POA: Diagnosis not present

## 2022-05-10 DIAGNOSIS — Z125 Encounter for screening for malignant neoplasm of prostate: Secondary | ICD-10-CM | POA: Diagnosis not present

## 2022-05-10 DIAGNOSIS — D472 Monoclonal gammopathy: Secondary | ICD-10-CM | POA: Diagnosis not present

## 2022-05-23 DIAGNOSIS — D649 Anemia, unspecified: Secondary | ICD-10-CM | POA: Diagnosis not present

## 2022-05-23 DIAGNOSIS — D472 Monoclonal gammopathy: Secondary | ICD-10-CM | POA: Diagnosis not present

## 2022-05-23 DIAGNOSIS — N189 Chronic kidney disease, unspecified: Secondary | ICD-10-CM | POA: Diagnosis not present

## 2022-05-23 DIAGNOSIS — R718 Other abnormality of red blood cells: Secondary | ICD-10-CM | POA: Diagnosis not present

## 2022-05-23 DIAGNOSIS — D696 Thrombocytopenia, unspecified: Secondary | ICD-10-CM | POA: Diagnosis not present

## 2022-06-11 ENCOUNTER — Telehealth: Payer: Self-pay | Admitting: Family Medicine

## 2022-06-11 ENCOUNTER — Encounter: Payer: Self-pay | Admitting: Family Medicine

## 2022-06-11 NOTE — Telephone Encounter (Signed)
Placard  Copied Noted sleeved

## 2022-06-12 ENCOUNTER — Encounter: Payer: Self-pay | Admitting: Orthopedic Surgery

## 2022-06-12 NOTE — Telephone Encounter (Signed)
Patient picked up form and a copy sent to scan department

## 2022-06-14 ENCOUNTER — Encounter: Payer: Self-pay | Admitting: Orthopedic Surgery

## 2022-06-14 ENCOUNTER — Encounter: Payer: Self-pay | Admitting: Radiology

## 2022-06-15 ENCOUNTER — Ambulatory Visit: Payer: Medicare Other | Admitting: Orthopedic Surgery

## 2022-06-15 IMAGING — MR MR HIP*L* W/CM
5 of 6 series · 38 of 40 positions shown · IV contrast (agent unspecified)
Comparison: None.

CLINICAL DATA: Chronic left hip pain.  Question labral tear.

EXAM:
MRI OF THE LEFT HIP WITH CONTRAST (MR Arthrogram)
TECHNIQUE: Multiplanar, multisequence MR imaging of the hip was performed
immediately following contrast injection into the hip joint under
fluoroscopic guidance. No intravenous contrast was administered.

[Series 8: T1 · coronal · left · 4.0mm · 1.92mm/px · 6 of 25 slices shown]
[im 1/25]
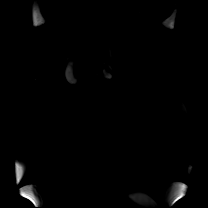
[im 5/25]
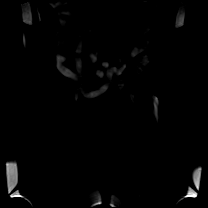
[im 10/25]
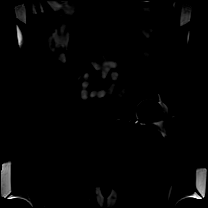
[im 15/25]
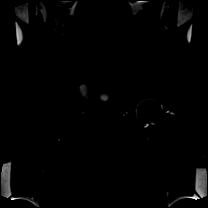
[im 20/25]
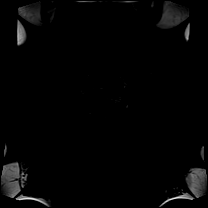
[im 25/25]
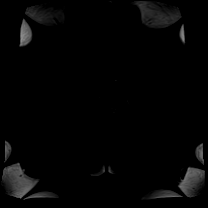

[Series 9: T2 fat-sat · coronal · left · 4.0mm · 1.92mm/px · 6 of 25 slices shown]
[im 1/25]
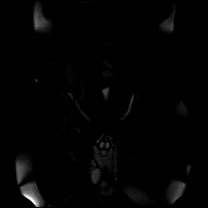
[im 5/25]
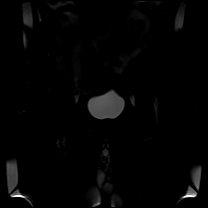
[im 10/25]
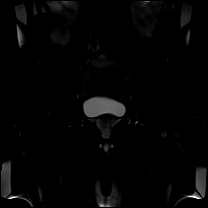
[im 15/25]
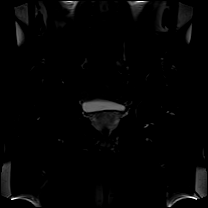
[im 20/25]
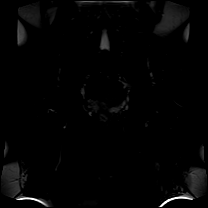
[im 25/25]
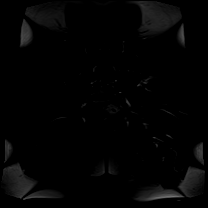

[Series 10: T1 fat-sat · axial · left · 4.0mm · 0.70mm/px · z∈[-69,+74]mm · 8 of 33 slices shown (1 of 3)]
[im 1/33]
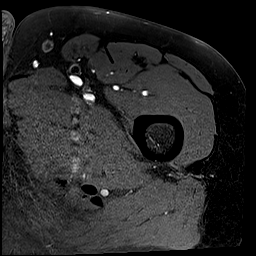
[im 5/33]
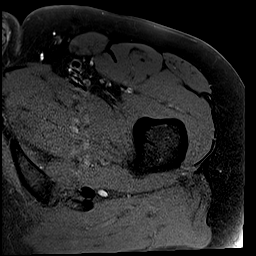
[im 10/33]
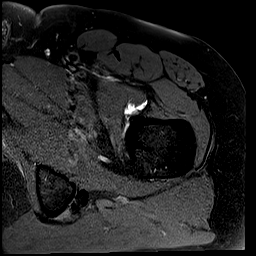
[im 14/33]
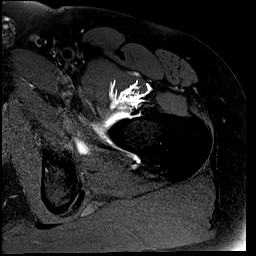
[im 19/33]
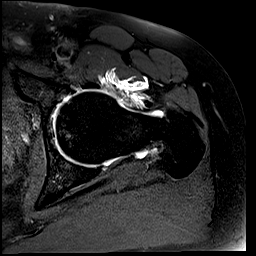
[im 23/33]
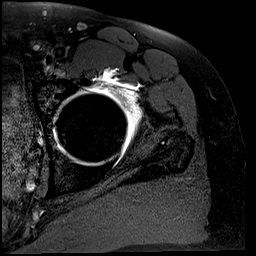
[im 28/33]
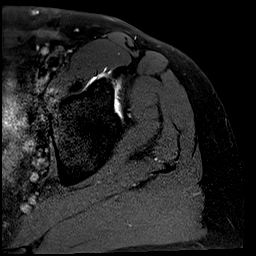
[im 33/33]
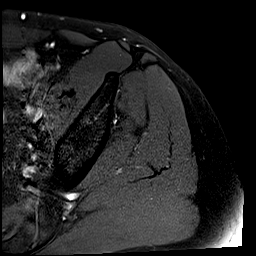

[Series 11: T1 fat-sat · sagittal · left · 4.0mm · 0.77mm/px · 8 of 32 slices shown (2 of 3)]
[im 1/32]
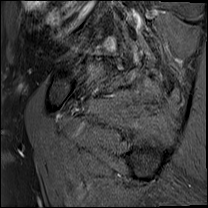
[im 5/32]
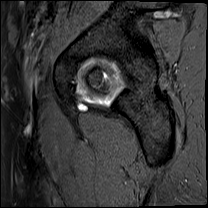
[im 9/32]
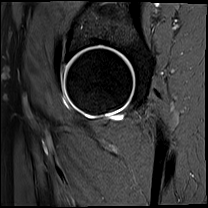
[im 14/32]
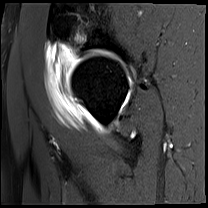
[im 18/32]
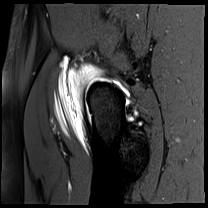
[im 23/32]
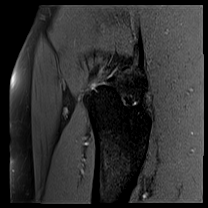
[im 27/32]
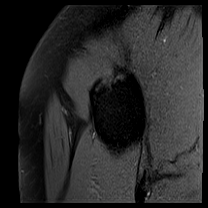
[im 32/32]
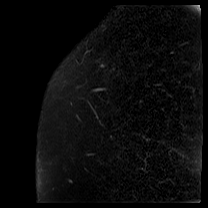

[Series 13: T1 fat-sat · coronal · left · 4.0mm · 0.47mm/px · 10 of 40 slices shown (3 of 3)]
[im 1/40]
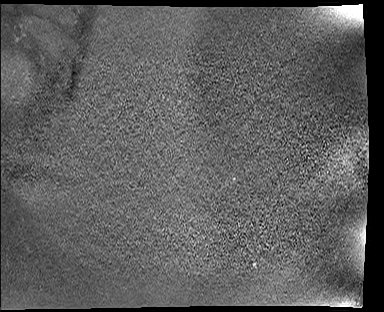
[im 5/40]
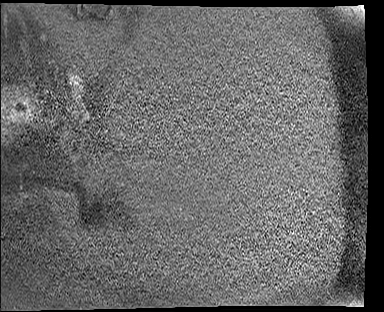
[im 9/40]
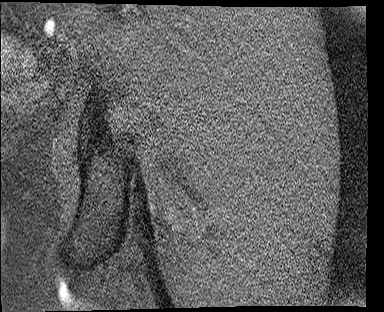
[im 14/40]
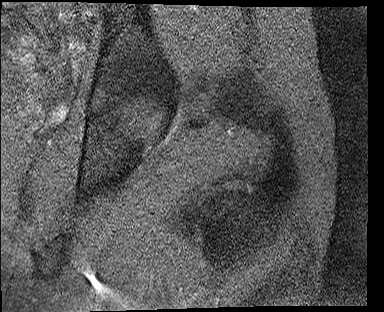
[im 18/40]
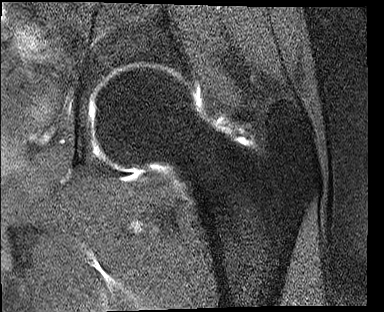
[im 22/40]
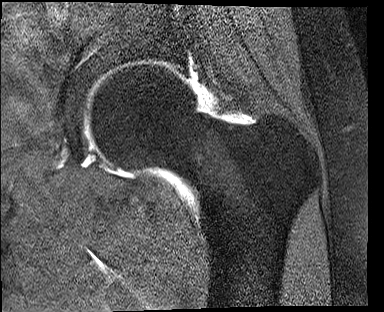
[im 27/40]
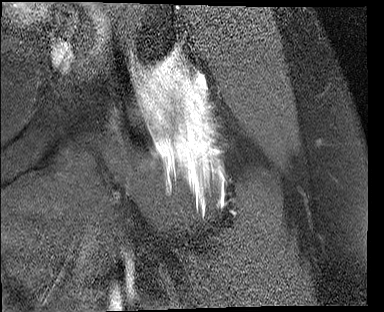
[im 31/40]
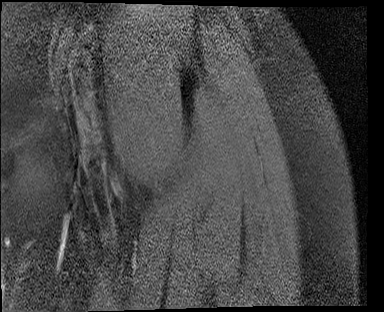
[im 35/40]
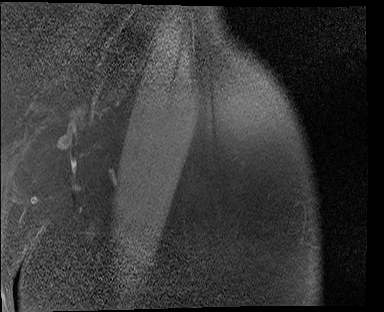
[im 40/40]
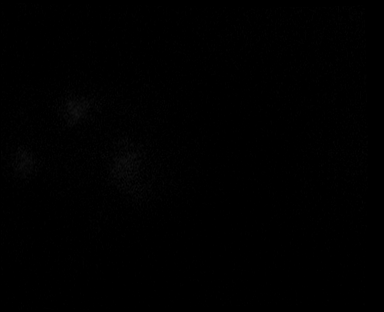

[38 of 40 positions shown; findings below may reference images not displayed]

FINDINGS: Bones: Marrow signal is normal without fracture, stress change or
worrisome lesion. No avascular necrosis of the femoral heads. No
subchondral cyst formation or edema about the hips.

Articular cartilage and labrum

Articular cartilage:  Preserved.

Labrum:  There is a tear of the anterior, superior labrum.

Joint or bursal effusion

Joint effusion: The left hip is distended with contrast. No right
hip effusion.

Bursae: Negative.

Muscles and tendons

Muscles and tendons:  Intact.

Other findings

Miscellaneous:   Imaged intrapelvic contents are unremarkable.
IMPRESSION: The examination is positive for an anterior, superior left labral
tear. The left hip otherwise appears normal.

## 2022-06-18 ENCOUNTER — Ambulatory Visit (INDEPENDENT_AMBULATORY_CARE_PROVIDER_SITE_OTHER): Payer: Medicare Other

## 2022-06-18 ENCOUNTER — Ambulatory Visit (INDEPENDENT_AMBULATORY_CARE_PROVIDER_SITE_OTHER): Payer: Medicare Other | Admitting: Orthopedic Surgery

## 2022-06-18 ENCOUNTER — Encounter: Payer: Self-pay | Admitting: Orthopedic Surgery

## 2022-06-18 DIAGNOSIS — M79645 Pain in left finger(s): Secondary | ICD-10-CM | POA: Diagnosis not present

## 2022-06-18 DIAGNOSIS — M25522 Pain in left elbow: Secondary | ICD-10-CM

## 2022-06-18 NOTE — Progress Notes (Signed)
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  He has a history of myeloma and his family and is recently been to [facility] where they see some myeloma cells in his blood  He continues to have no symptoms in his left elbow.  He did have a bone scan at [facility] and there was no mention of any lesions in the elbow.  He also injured his left long finger at the PIP joint about 3 weeks ago.  He has regained range of motion but he is concerned that there is still some swelling  Examination of the left long finger shows that he is swollen around the PIP joint he has full extension of the joint as well as flexion.  There is tenderness along the collateral ligaments but no instability  We did obtain a film which showed no fracture  We expect full resolution of the PIP joint sprain  There is no need to further image the elbow and his follow-up care will be at [facility] but we are happy to help in any way that we can

## 2022-06-20 DIAGNOSIS — I10 Essential (primary) hypertension: Secondary | ICD-10-CM | POA: Diagnosis not present

## 2022-06-20 DIAGNOSIS — N182 Chronic kidney disease, stage 2 (mild): Secondary | ICD-10-CM | POA: Diagnosis not present

## 2022-07-30 ENCOUNTER — Encounter: Payer: Self-pay | Admitting: Family Medicine

## 2022-07-31 ENCOUNTER — Other Ambulatory Visit: Payer: Self-pay

## 2022-07-31 MED ORDER — AMLODIPINE BESYLATE 5 MG PO TABS: 5.0000 mg | ORAL_TABLET | Freq: Every day | ORAL | 3 refills | Status: DC

## 2022-08-07 DIAGNOSIS — H2513 Age-related nuclear cataract, bilateral: Secondary | ICD-10-CM | POA: Diagnosis not present

## 2022-08-07 DIAGNOSIS — H3589 Other specified retinal disorders: Secondary | ICD-10-CM | POA: Diagnosis not present

## 2022-08-12 DIAGNOSIS — Z23 Encounter for immunization: Secondary | ICD-10-CM | POA: Diagnosis not present

## 2022-08-24 ENCOUNTER — Telehealth: Payer: Self-pay | Admitting: Family Medicine

## 2022-08-24 ENCOUNTER — Other Ambulatory Visit: Payer: Self-pay | Admitting: Family Medicine

## 2022-08-24 ENCOUNTER — Other Ambulatory Visit (HOSPITAL_COMMUNITY)
Admission: RE | Admit: 2022-08-24 | Discharge: 2022-08-24 | Disposition: A | Discharge status: Home / Self Care | Payer: Medicare Other | Source: Ambulatory Visit | Attending: Family Medicine | Admitting: Family Medicine

## 2022-08-24 DIAGNOSIS — I10 Essential (primary) hypertension: Secondary | ICD-10-CM | POA: Insufficient documentation

## 2022-08-24 DIAGNOSIS — E785 Hyperlipidemia, unspecified: Secondary | ICD-10-CM

## 2022-08-24 DIAGNOSIS — D72819 Decreased white blood cell count, unspecified: Secondary | ICD-10-CM

## 2022-08-24 LAB — CBC
HCT: 38.9 % — ABNORMAL LOW (ref 39.0–52.0)
Hemoglobin: 13.1 g/dL (ref 13.0–17.0)
MCH: 22.7 pg — ABNORMAL LOW (ref 26.0–34.0)
MCHC: 33.7 g/dL (ref 30.0–36.0)
MCV: 67.4 fL — ABNORMAL LOW (ref 80.0–100.0)
Platelets: 185 10*3/uL (ref 150–400)
RBC: 5.77 MIL/uL (ref 4.22–5.81)
RDW: 16.2 % — ABNORMAL HIGH (ref 11.5–15.5)
WBC: 3.8 10*3/uL — ABNORMAL LOW (ref 4.0–10.5)
nRBC: 0 % (ref 0.0–0.2)

## 2022-08-24 LAB — COMPREHENSIVE METABOLIC PANEL
ALT: 22 U/L (ref 0–44)
AST: 22 U/L (ref 15–41)
Albumin: 3.9 g/dL (ref 3.5–5.0)
Alkaline Phosphatase: 88 U/L (ref 38–126)
Anion gap: 10 (ref 5–15)
BUN: 15 mg/dL (ref 8–23)
CO2: 25 mmol/L (ref 22–32)
Calcium: 8.8 mg/dL — ABNORMAL LOW (ref 8.9–10.3)
Chloride: 102 mmol/L (ref 98–111)
Creatinine, Ser: 1.32 mg/dL — ABNORMAL HIGH (ref 0.61–1.24)
GFR, Estimated: 58 mL/min — ABNORMAL LOW (ref >60)
Glucose, Bld: 94 mg/dL (ref 70–99)
Potassium: 3.9 mmol/L (ref 3.5–5.1)
Sodium: 137 mmol/L (ref 135–145)
Total Bilirubin: 1 mg/dL (ref 0.3–1.2)
Total Protein: 6.9 g/dL (ref 6.5–8.1)

## 2022-08-24 LAB — LIPID PANEL
Cholesterol: 191 mg/dL (ref 0–200)
HDL: 62 mg/dL (ref >40)
LDL Cholesterol: 122 mg/dL — ABNORMAL HIGH (ref 0–99)
Total CHOL/HDL Ratio: 3.1 RATIO
Triglycerides: 37 mg/dL (ref <150)
VLDL: 7 mg/dL (ref 0–40)

## 2022-08-24 NOTE — Telephone Encounter (Signed)
Patient spouse came by our office and said patient labs labs should say STAT  patient is waiting there now at the [facility] he said please make this quick per patient. Sent message as emergency teams to clinic

## 2022-08-24 NOTE — Telephone Encounter (Signed)
done

## 2022-08-28 DIAGNOSIS — D472 Monoclonal gammopathy: Secondary | ICD-10-CM | POA: Diagnosis not present

## 2022-08-28 DIAGNOSIS — D709 Neutropenia, unspecified: Secondary | ICD-10-CM | POA: Diagnosis not present

## 2022-08-28 DIAGNOSIS — D479 Neoplasm of uncertain behavior of lymphoid, hematopoietic and related tissue, unspecified: Secondary | ICD-10-CM | POA: Diagnosis not present

## 2022-08-28 DIAGNOSIS — R718 Other abnormality of red blood cells: Secondary | ICD-10-CM | POA: Diagnosis not present

## 2022-08-29 ENCOUNTER — Encounter: Payer: Self-pay | Admitting: Family Medicine

## 2022-08-29 ENCOUNTER — Ambulatory Visit (INDEPENDENT_AMBULATORY_CARE_PROVIDER_SITE_OTHER): Payer: Medicare Other | Admitting: Family Medicine

## 2022-08-29 ENCOUNTER — Other Ambulatory Visit: Payer: Self-pay | Admitting: Family Medicine

## 2022-08-29 VITALS — BP 140/92 | HR 77 | Ht 74.0 in | Wt 180.1 lb

## 2022-08-29 DIAGNOSIS — I10 Essential (primary) hypertension: Secondary | ICD-10-CM

## 2022-08-29 DIAGNOSIS — E785 Hyperlipidemia, unspecified: Secondary | ICD-10-CM | POA: Diagnosis not present

## 2022-08-29 DIAGNOSIS — D472 Monoclonal gammopathy: Secondary | ICD-10-CM | POA: Diagnosis not present

## 2022-08-29 MED ORDER — ROSUVASTATIN CALCIUM 5 MG PO TABS: 5.0000 mg | ORAL_TABLET | Freq: Every day | ORAL | 1 refills | Status: DC

## 2022-08-29 MED ORDER — AMLODIPINE BESYLATE 2.5 MG PO TABS: 2.5000 mg | ORAL_TABLET | Freq: Every day | ORAL | 2 refills | Status: DC

## 2022-08-29 NOTE — Patient Instructions (Addendum)
Follow-up in 8 weeks,  re evaluate blood pressure and check liver, call if you need me sooner.  New additional medication for blood pressure is amlodipine 2.5 mg, this makes your total daily dose of amlodipine 7.5 mg.  New for your cholesterol is Crestor 5 mg 1 daily.  Please intentionally lower your fat intake also.  Nonfasting hepatic panel to be done 3 days before your 8-week follow-up.  You will need Cologuard testing done in November of this year.  Please get your shingles vaccines at the pharmacy.  It is important that you exercise regularly at least 30 minutes 5 times a week. If you develop chest pain, have severe difficulty breathing, or feel very tired, stop exercising immediately and seek medical attention   Thanks for choosing [facility], we consider it a privelige to serve you.

## 2022-08-29 NOTE — Progress Notes (Signed)
   Mark Sandoval     MRN: [id number]      DOB: 07/28/1950  Chief Complaint  Patient presents with   Follow-up    6 month follow up    HPI Mark Sandoval is here for follow up and re-evaluation of chronic medical conditions, medication management and review of any available recent lab and radiology data.  Preventive health is updated, specifically  Cancer screening and Immunization.   Questions or concerns regarding consultations or procedures which the PT has had in the interim are  addressed.Had bone marrow biopsy yesterday to determine when MM expected to develop The PT denies any adverse reactions to current medications since the last visit.  Blood pressure elevated also cholesterol med adjustments needed ROS Denies recent fever or chills. Denies sinus pressure, nasal congestion, ear pain or sore throat. Denies chest congestion, productive cough or wheezing. Denies chest pains, palpitations and leg swelling Denies abdominal pain, nausea, vomiting,diarrhea or constipation.   Denies dysuria, frequency, hesitancy or incontinence. Denies uncontrolled  joint pain, swelling and limitation in mobility. Denies headaches, seizures, numbness, or tingling. Denies depression, anxiety or insomnia. Denies skin break down or rash.   PE  BP (!) 140/92   Pulse 77   Ht 6\' 2"  (1.88 m)   Wt 180 lb 1.9 oz (81.7 kg)   SpO2 98%   BMI 23.13 kg/m    Patient alert and oriented and in no cardiopulmonary distress.  HEENT: No facial asymmetry, EOMI,     Neck supple .  Chest: Clear to auscultation bilaterally.  CVS: S1, S2 no murmurs, no S3.Regular rate.  Ext: No edema  MS: Adequate ROM spine, shoulders, hips and knees.  Skin: Intact, no ulcerations or rash noted.  Psych: Good eye contact, normal affect. Memory intact not anxious or depressed appearing.  CNS: CN 2-12 intact, power,  normal throughout.no focal deficits noted.   Assessment & Plan  Essential hypertension Uncontrolled  , add amlodipine 2.5 mg so total daily dose is 7.5 mg  DASH diet and commitment to daily physical activity for a minimum of 30 minutes discussed and encouraged, as a part of hypertension management. The importance of attaining a healthy weight is also discussed.     08/29/2022    8:52 AM 08/29/2022    8:11 AM 02/27/2022    8:35 AM 02/27/2022    8:09 AM 12/29/2021   10:13 AM 10/12/2021    2:11 PM 10/12/2021    1:51 PM  BP/Weight  Systolic BP 140 137 124 116 118 124 122  Diastolic BP 92 86 70 77 70 82 83  Wt. (Lbs)  180.12  175.04 178  185.12  BMI  23.13 kg/m2  22.47 kg/m2 22.85 kg/m2  23.77 kg/m2     F/u in 6 to 8 weeks  MGUS (monoclonal gammopathy of unknown significance) Management through [facility]  Dyslipidemia, goal LDL below 100 Hyperlipidemia:Low fat diet discussed and encouraged.   Lipid Panel  Lab Results  Component Value Date   CHOL 191 08/24/2022   HDL 62 08/24/2022   LDLCALC 122 (H) 08/24/2022   TRIG 37 08/24/2022   CHOLHDL 3.1 08/24/2022     Start crestor 5 mg daily, rept LFT in 2 months Lower fat intake

## 2022-08-29 NOTE — Assessment & Plan Note (Signed)
Hyperlipidemia:Low fat diet discussed and encouraged.   Lipid Panel  Lab Results  Component Value Date   CHOL 191 08/24/2022   HDL 62 08/24/2022   LDLCALC 122 (H) 08/24/2022   TRIG 37 08/24/2022   CHOLHDL 3.1 08/24/2022     Start crestor 5 mg daily, rept LFT in 2 months Lower fat intake

## 2022-08-29 NOTE — Assessment & Plan Note (Signed)
Uncontrolled , add amlodipine 2.5 mg so total daily dose is 7.5 mg  DASH diet and commitment to daily physical activity for a minimum of 30 minutes discussed and encouraged, as a part of hypertension management. The importance of attaining a healthy weight is also discussed.     08/29/2022    8:52 AM 08/29/2022    8:11 AM 02/27/2022    8:35 AM 02/27/2022    8:09 AM 12/29/2021   10:13 AM 10/12/2021    2:11 PM 10/12/2021    1:51 PM  BP/Weight  Systolic BP 140 137 124 116 118 124 122  Diastolic BP 92 86 70 77 70 82 83  Wt. (Lbs)  180.12  175.04 178  185.12  BMI  23.13 kg/m2  22.47 kg/m2 22.85 kg/m2  23.77 kg/m2     F/u in 6 to 8 weeks

## 2022-08-29 NOTE — Assessment & Plan Note (Signed)
Management through [facility]

## 2022-09-11 DIAGNOSIS — R718 Other abnormality of red blood cells: Secondary | ICD-10-CM | POA: Diagnosis not present

## 2022-09-11 DIAGNOSIS — D472 Monoclonal gammopathy: Secondary | ICD-10-CM | POA: Diagnosis not present

## 2022-09-11 DIAGNOSIS — D696 Thrombocytopenia, unspecified: Secondary | ICD-10-CM | POA: Diagnosis not present

## 2022-09-13 DIAGNOSIS — D472 Monoclonal gammopathy: Secondary | ICD-10-CM | POA: Diagnosis not present

## 2022-10-19 ENCOUNTER — Other Ambulatory Visit (HOSPITAL_COMMUNITY)
Admission: RE | Admit: 2022-10-19 | Discharge: 2022-10-19 | Disposition: A | Discharge status: Home / Self Care | Payer: Medicare Other | Source: Ambulatory Visit | Attending: Family Medicine | Admitting: Family Medicine

## 2022-10-19 DIAGNOSIS — I10 Essential (primary) hypertension: Secondary | ICD-10-CM | POA: Diagnosis not present

## 2022-10-19 LAB — HEPATIC FUNCTION PANEL
ALT: 29 U/L (ref 0–44)
AST: 26 U/L (ref 15–41)
Albumin: 3.9 g/dL (ref 3.5–5.0)
Alkaline Phosphatase: 87 U/L (ref 38–126)
Bilirubin, Direct: 0.1 mg/dL (ref 0.0–0.2)
Indirect Bilirubin: 0.6 mg/dL (ref 0.3–0.9)
Total Bilirubin: 0.7 mg/dL (ref 0.3–1.2)
Total Protein: 6.6 g/dL (ref 6.5–8.1)

## 2022-10-22 ENCOUNTER — Other Ambulatory Visit: Payer: Self-pay | Admitting: Family Medicine

## 2022-10-22 DIAGNOSIS — I10 Essential (primary) hypertension: Secondary | ICD-10-CM

## 2022-10-24 ENCOUNTER — Other Ambulatory Visit: Payer: Self-pay

## 2022-10-24 ENCOUNTER — Encounter: Payer: Self-pay | Admitting: Family Medicine

## 2022-10-24 ENCOUNTER — Ambulatory Visit (INDEPENDENT_AMBULATORY_CARE_PROVIDER_SITE_OTHER): Payer: Medicare Other | Admitting: Family Medicine

## 2022-10-24 VITALS — BP 128/82 | HR 70 | Ht 74.0 in | Wt 180.1 lb

## 2022-10-24 DIAGNOSIS — E785 Hyperlipidemia, unspecified: Secondary | ICD-10-CM | POA: Diagnosis not present

## 2022-10-24 DIAGNOSIS — I10 Essential (primary) hypertension: Secondary | ICD-10-CM | POA: Diagnosis not present

## 2022-10-24 MED ORDER — ROSUVASTATIN CALCIUM 5 MG PO TABS: 5.0000 mg | ORAL_TABLET | Freq: Every day | ORAL | 1 refills | Status: DC

## 2022-10-24 MED ORDER — AMLODIPINE BESYLATE 2.5 MG PO TABS: 2.5000 mg | ORAL_TABLET | Freq: Every day | ORAL | 1 refills | Status: DC

## 2022-10-24 NOTE — Patient Instructions (Signed)
F/U as before, call if you need me sooner  Blood pressure Is improved , continue medications you are taking,amlodipine 5 mg and amlodipine 2.5 mg   Rosuvastatin and amlodipine 2.5 mg have both been  prescribed for 90 day supplies  Please reduce salt intake  Fasting lipid, cmp and EGR first week in September  It is important that you exercise regularly at least 30 minutes 5 times a week. If you develop chest pain, have severe difficulty breathing, or feel very tired, stop exercising immediately and seek medical attention   Thanks for choosing [facility], we consider it a privelige to serve you.

## 2022-10-25 ENCOUNTER — Other Ambulatory Visit (HOSPITAL_COMMUNITY)
Admission: RE | Admit: 2022-10-25 | Discharge: 2022-10-25 | Disposition: A | Discharge status: Home / Self Care | Payer: Medicare Other | Source: Ambulatory Visit | Attending: Family Medicine | Admitting: Family Medicine

## 2022-10-25 DIAGNOSIS — I10 Essential (primary) hypertension: Secondary | ICD-10-CM | POA: Diagnosis not present

## 2022-10-25 LAB — BASIC METABOLIC PANEL
Anion gap: 7 (ref 5–15)
BUN: 19 mg/dL (ref 8–23)
CO2: 25 mmol/L (ref 22–32)
Calcium: 8.6 mg/dL — ABNORMAL LOW (ref 8.9–10.3)
Chloride: 103 mmol/L (ref 98–111)
Creatinine, Ser: 1.26 mg/dL — ABNORMAL HIGH (ref 0.61–1.24)
GFR, Estimated: >60 mL/min (ref >60)
Glucose, Bld: 127 mg/dL — ABNORMAL HIGH (ref 70–99)
Potassium: 4.1 mmol/L (ref 3.5–5.1)
Sodium: 135 mmol/L (ref 135–145)

## 2022-10-28 ENCOUNTER — Encounter: Payer: Self-pay | Admitting: Family Medicine

## 2022-10-28 NOTE — Assessment & Plan Note (Signed)
Controlled, no change in medication DASH diet and commitment to daily physical activity for a minimum of 30 minutes discussed and encouraged, as a part of hypertension management. The importance of attaining a healthy weight is also discussed.     10/24/2022    4:18 PM 10/24/2022    4:15 PM 10/24/2022    3:41 PM 08/29/2022    8:52 AM 08/29/2022    8:11 AM 02/27/2022    8:35 AM 02/27/2022    8:09 AM  BP/Weight  Systolic BP 128 130 117 140 137 124 116  Diastolic BP 82 82 78 92 86 70 77  Wt. (Lbs)   180.08  180.12  175.04  BMI   23.12 kg/m2  23.13 kg/m2  22.47 kg/m2

## 2022-10-28 NOTE — Progress Notes (Signed)
   Mark Sandoval     MRN: [id number]      DOB: 07/21/1950  Chief Complaint  Patient presents with   Follow-up    8 wk follow up    HPI Mr. Brueggeman is here for follow up and re-evaluation of hypertension and hyperlipidemia No advers s/e from medications he is taking. Recent lab is also reviewed and good ROS No complaints or concerns voiced .   PE  BP 128/82   Pulse 70   Ht 6\' 2"  (1.88 m)   Wt 180 lb 1.3 oz (81.7 kg)   SpO2 98%   BMI 23.12 kg/m   Patient alert and oriented and in no cardiopulmonary distress.  HEENT: No facial asymmetry, EOMI,     Neck supple .  Chest: Clear to auscultation bilaterally.  CVS: S1, S2 no murmurs, no S3.Regular rate.     Ext: No edema  : Intact, no ulcerations or rash noted.  Psych: Good eye contact, normal affect. Memory intact not anxious or depressed appearing.  CNS: CN 2-12 intact, power,  normal throughout.no focal deficits noted.   Assessment & Plan  Essential hypertension Controlled, no change in medication DASH diet and commitment to daily physical activity for a minimum of 30 minutes discussed and encouraged, as a part of hypertension management. The importance of attaining a healthy weight is also discussed.     10/24/2022    4:18 PM 10/24/2022    4:15 PM 10/24/2022    3:41 PM 08/29/2022    8:52 AM 08/29/2022    8:11 AM 02/27/2022    8:35 AM 02/27/2022    8:09 AM  BP/Weight  Systolic BP 128 130 117 140 137 124 116  Diastolic BP 82 82 78 92 86 70 77  Wt. (Lbs)   180.08  180.12  175.04  BMI   23.12 kg/m2  23.13 kg/m2  22.47 kg/m2       Dyslipidemia, goal LDL below 100 Hyperlipidemia:Low fat diet discussed and encouraged.   Lipid Panel  Lab Results  Component Value Date   CHOL 191 08/24/2022   HDL 62 08/24/2022   LDLCALC 122 (H) 08/24/2022   TRIG 37 08/24/2022   CHOLHDL 3.1 08/24/2022     Updated lab needed at/ before next visit. Tolerating statin and hepatic panel normal

## 2022-10-28 NOTE — Assessment & Plan Note (Signed)
Hyperlipidemia:Low fat diet discussed and encouraged.   Lipid Panel  Lab Results  Component Value Date   CHOL 191 08/24/2022   HDL 62 08/24/2022   LDLCALC 122 (H) 08/24/2022   TRIG 37 08/24/2022   CHOLHDL 3.1 08/24/2022     Updated lab needed at/ before next visit. Tolerating statin and hepatic panel normal

## 2022-11-01 ENCOUNTER — Telehealth: Payer: Self-pay | Admitting: Family Medicine

## 2022-11-01 ENCOUNTER — Ambulatory Visit
Admission: EM | Admit: 2022-11-01 | Discharge: 2022-11-01 | Disposition: A | Discharge status: Home / Self Care | Payer: Medicare Other | Attending: Family Medicine | Admitting: Family Medicine

## 2022-11-01 DIAGNOSIS — D472 Monoclonal gammopathy: Secondary | ICD-10-CM | POA: Diagnosis not present

## 2022-11-01 DIAGNOSIS — J069 Acute upper respiratory infection, unspecified: Secondary | ICD-10-CM | POA: Diagnosis not present

## 2022-11-01 DIAGNOSIS — U071 COVID-19: Secondary | ICD-10-CM | POA: Diagnosis not present

## 2022-11-01 MED ORDER — MOLNUPIRAVIR EUA 200MG CAPSULE: 4.0000 | ORAL_CAPSULE | Freq: Two times a day (BID) | ORAL | 0 refills | Status: AC

## 2022-11-01 MED ORDER — PAXLOVID (300/100) 20 X 150 MG & 10 X 100MG PO TBPK: 3.0000 | ORAL_TABLET | Freq: Two times a day (BID) | ORAL | 0 refills | Status: AC

## 2022-11-01 NOTE — ED Provider Notes (Signed)
[facility]    CSN: [id number] Arrival date & time: 11/01/22  1555      History   Chief Complaint No chief complaint on file.   HPI Mark Sandoval is a 72 y.o. male.   Presenting today with 2-day history of fever, chills, body aches, fatigue, runny nose, cough.  Denies chest pain, shortness of breath, abdominal pain, nausea vomiting or diarrhea.  So far trying Tylenol and Claritin with no relief.  No known sick contacts recently.  No known chronic pulmonary disease.    Past Medical History:  Diagnosis Date   Dyslipidemia    GERD (gastroesophageal reflux disease)    History of seasonal allergies    Hypertension    Leukopenia    Lump or mass in breast    benign   Other spondylosis with radiculopathy, cervical region    Thrombocytopenia [facility])     Patient Active Problem List   Diagnosis Date Noted   MGUS (monoclonal gammopathy of unknown significance) 02/27/2022   Tick bite of buttock 10/12/2021   Abnormal creatinine clearance glomerular filtration 10/17/2020   Abnormal blood creatinine level 10/17/2020   Skin nodule 02/03/2020   Burping 10/01/2019   Renal cysts, acquired, bilateral 07/28/2019   History of palpitations 08/04/2017   Other spondylosis with radiculopathy, cervical region 08/21/2016   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   Neutropenia (HCC) 08/17/2016   RBC microcytosis 08/17/2016   Diverticulosis of colon without hemorrhage 05/03/2016   Dyslipidemia, goal LDL below 100 01/15/2014   Carpal tunnel syndrome 02/14/2013   Neck pain on right side 02/02/2011   Leukocytopenia 12/08/2007   Allergic rhinitis 12/08/2007   Essential hypertension 11/21/2007    Past Surgical History:  Procedure Laterality Date   athroscopy of left knee  2002   CHOLECYSTECTOMY  2007   COLONOSCOPY N/A 03/29/2016   Procedure: COLONOSCOPY;  Surgeon: Malissa Hippo, MD;  Location: [facility];  Service: Endoscopy;  Laterality: N/A;  1030   LIPOMA EXCISION N/A  09/23/2020   Procedure: EXCISION LIPOMA; abdominal wall;  Surgeon: Franky Macho, MD;  Location: [facility]S;  Service: General;  Laterality: N/A;   removal of benign left breast lump  09/06/07       Home Medications    Prior to Admission medications   Medication Sig Start Date End Date Taking? Authorizing Provider  molnupiravir EUA (LAGEVRIO) 200 mg CAPS capsule Take 4 capsules (800 mg total) by mouth 2 (two) times daily for 5 days. 11/01/22 11/06/22 Yes Particia Nearing, PA-C  amLODipine (NORVASC) 2.5 MG tablet Take 1 tablet (2.5 mg total) by mouth daily. 10/24/22   Kerri Perches, MD  amLODipine (NORVASC) 5 MG tablet Take 1 tablet (5 mg total) by mouth daily. 07/31/22   Kerri Perches, MD  Multiple Vitamin (MULTIVITAMIN) tablet Take 1 tablet by mouth daily.    [provider]  rosuvastatin (CRESTOR) 5 MG tablet Take 1 tablet (5 mg total) by mouth daily. 10/24/22   Kerri Perches, MD    Family History Family History  Problem Relation Age of Onset   Cancer Mother        hematologic cancer    Hypertension Mother    Alcohol abuse Father     Social History Social History   Tobacco Use   Smoking status: Never   Smokeless tobacco: Never  Substance Use Topics   Alcohol use: Yes    Alcohol/week: 0.0 standard drinks of alcohol    Comment: occasional  Drug use: No     Allergies   Compazine and Daucus carota   Review of Systems Review of Systems Per HPI  Physical Exam Triage Vital Signs ED Triage Vitals [11/01/22 1656]  Encounter Vitals Group     BP (!) 144/101     Systolic BP Percentile      Diastolic BP Percentile      Pulse Rate (!) 102     Resp 20     Temp 98.6 F (37 C)     Temp src      SpO2 97 %     Weight      Height      Head Circumference      Peak Flow      Pain Score 4     Pain Loc      Pain Education      Exclude from Growth Chart    No data found.  Updated Vital Signs BP (!) 144/101 (BP Location: Right Arm)   Pulse  (!) 102   Temp 98.6 F (37 C)   Resp 20   SpO2 97%   Visual Acuity Right Eye Distance:   Left Eye Distance:   Bilateral Distance:    Right Eye Near:   Left Eye Near:    Bilateral Near:     Physical Exam Vitals and nursing note reviewed.  Constitutional:      Appearance: He is well-developed.  HENT:     Head: Atraumatic.     Right Ear: External ear normal.     Left Ear: External ear normal.     Nose: Rhinorrhea present.     Mouth/Throat:     Pharynx: Posterior oropharyngeal erythema present. No oropharyngeal exudate.  Eyes:     Conjunctiva/sclera: Conjunctivae normal.     Pupils: Pupils are equal, round, and reactive to light.  Cardiovascular:     Rate and Rhythm: Normal rate and regular rhythm.  Pulmonary:     Effort: Pulmonary effort is normal. No respiratory distress.     Breath sounds: No wheezing or rales.  Musculoskeletal:        General: Normal range of motion.     Cervical back: Normal range of motion and neck supple.  Lymphadenopathy:     Cervical: No cervical adenopathy.  Skin:    General: Skin is warm and dry.  Neurological:     Mental Status: He is alert and oriented to person, place, and time.  Psychiatric:        Behavior: Behavior normal.      UC Treatments / Results  Labs (all labs ordered are listed, but only abnormal results are displayed) Labs Reviewed  SARS CORONAVIRUS 2 (TAT 6-24 HRS)    EKG   Radiology No results found.  Procedures Procedures (including critical care time)  Medications Ordered in UC Medications - No data to display  Initial Impression / Assessment and Plan / UC Course  I have reviewed the triage vital signs and the nursing notes.  Pertinent labs & imaging results that were available during my care of the patient were reviewed by me and considered in my medical decision making (see chart for details).     Mildly tachycardic and hypertensive in triage, otherwise vital signs reassuring.  Exam suggestive of  viral upper respiratory infection.  COVID testing pending, given my high suspicion for this at this time we will start molnupiravir while awaiting results.  Discussed supportive over-the-counter medications and home care additionally.  Final Clinical  Impressions(s) / UC Diagnoses   Final diagnoses:  Viral URI with cough     Discharge Instructions      We should have your COVID test results back tomorrow.  I have sent over molnupiravir, an antiviral medication for you to start while we wait for these results as I am fairly certain that you have COVID.  You may also take medications such as Coricidin HBP, Flonase, plain Mucinex to help with your symptoms.   ED Prescriptions     Medication Sig Dispense Auth. Provider   molnupiravir EUA (LAGEVRIO) 200 mg CAPS capsule Take 4 capsules (800 mg total) by mouth 2 (two) times daily for 5 days. 40 capsule Particia Nearing, [place]      PDMP not reviewed this encounter.   Particia Nearing, [place] 11/01/22 [phone]

## 2022-11-01 NOTE — Telephone Encounter (Signed)
Printed script as pharmacy was out of the medication

## 2022-11-01 NOTE — Telephone Encounter (Signed)
Patient then decided he wanted Paxlovid and would stop his Crestor for the appropriate amount of time.

## 2022-11-01 NOTE — ED Triage Notes (Signed)
Pt reports he had a fever, runny nose, body aches, chills, and fatigue x 2 days.  Took tylenol and claritin

## 2022-11-01 NOTE — Discharge Instructions (Signed)
We should have your COVID test results back tomorrow.  I have sent over molnupiravir, an antiviral medication for you to start while we wait for these results as I am fairly certain that you have COVID.  You may also take medications such as Coricidin HBP, Flonase, plain Mucinex to help with your symptoms.

## 2022-11-02 LAB — SARS CORONAVIRUS 2 (TAT 6-24 HRS): SARS Coronavirus 2: POSITIVE — AB

## 2022-11-15 DIAGNOSIS — D582 Other hemoglobinopathies: Secondary | ICD-10-CM | POA: Diagnosis not present

## 2022-11-15 DIAGNOSIS — D472 Monoclonal gammopathy: Secondary | ICD-10-CM | POA: Diagnosis not present

## 2022-11-15 DIAGNOSIS — D709 Neutropenia, unspecified: Secondary | ICD-10-CM | POA: Diagnosis not present

## 2022-11-15 DIAGNOSIS — D696 Thrombocytopenia, unspecified: Secondary | ICD-10-CM | POA: Diagnosis not present

## 2022-11-27 ENCOUNTER — Encounter: Payer: Self-pay | Admitting: Orthopedic Surgery

## 2022-11-28 ENCOUNTER — Encounter: Payer: Self-pay | Admitting: Orthopedic Surgery

## 2022-11-28 NOTE — Progress Notes (Unsigned)
Complaint turf toe   My darco shoe has come apart   The shoe really works   Programmer, applications

## 2022-12-15 DIAGNOSIS — Z23 Encounter for immunization: Secondary | ICD-10-CM | POA: Diagnosis not present

## 2022-12-20 DIAGNOSIS — I10 Essential (primary) hypertension: Secondary | ICD-10-CM | POA: Diagnosis not present

## 2022-12-20 DIAGNOSIS — N182 Chronic kidney disease, stage 2 (mild): Secondary | ICD-10-CM | POA: Diagnosis not present

## 2023-01-08 DIAGNOSIS — N182 Chronic kidney disease, stage 2 (mild): Secondary | ICD-10-CM | POA: Diagnosis not present

## 2023-01-08 DIAGNOSIS — I10 Essential (primary) hypertension: Secondary | ICD-10-CM | POA: Diagnosis not present

## 2023-01-23 DIAGNOSIS — Z23 Encounter for immunization: Secondary | ICD-10-CM | POA: Diagnosis not present

## 2023-01-29 ENCOUNTER — Telehealth: Payer: Self-pay | Admitting: Radiology

## 2023-01-29 NOTE — Telephone Encounter (Signed)
Patient stopped by  Unhappy he has received bill from [employer] for the shoe Dr H had me give him He did not have appointment that day so there is no documentation about why he needed it  I told him to call number on [employer] bill, he said he doesn't think he should have to do that I told him I will have the company representative contact him but he may have to still call  I sent message to caroline along with his demographics  So she will know what's going on.   To you FYI

## 2023-01-29 NOTE — Telephone Encounter (Signed)
I heard back from [facility] are not covered by Medicare, he will have to pay for it  They did have his insurance.   I called him to advise. He voiced understanding

## 2023-03-01 ENCOUNTER — Ambulatory Visit: Payer: Medicare Other | Admitting: Family Medicine

## 2023-03-06 ENCOUNTER — Ambulatory Visit (INDEPENDENT_AMBULATORY_CARE_PROVIDER_SITE_OTHER): Payer: Medicare Other

## 2023-03-06 VITALS — Ht 73.0 in | Wt 181.0 lb

## 2023-03-06 DIAGNOSIS — Z0000 Encounter for general adult medical examination without abnormal findings: Secondary | ICD-10-CM

## 2023-03-06 NOTE — Patient Instructions (Signed)
Mark Sandoval , Thank you for taking time to come for your Medicare Wellness Visit. I appreciate your ongoing commitment to your health goals. Please review the following plan we discussed and let me know if I can assist you in the future.   Referrals/Orders/Follow-Ups/Clinician Recommendations:  Next Medicare Annual Wellness Visit: March 10, 2024 at 8:00 am virtual visit  This is a list of the screening recommended for you and due dates:  Health Maintenance  Topic Date Due   Zoster (Shingles) Vaccine (1 of 2) 01/07/1970   COVID-19 Vaccine (9 - 2023-24 season) 12/16/2022   Medicare Annual Wellness Visit  03/05/2024   Colon Cancer Screening  03/29/2026   DTaP/Tdap/Td vaccine (3 - Td or Tdap) 01/02/2027   Pneumonia Vaccine  Completed   Flu Shot  Completed   Hepatitis C Screening  Completed   HPV Vaccine  Aged Out    Advanced directives: (ACP Link)Information on Advanced Care Planning can be found at [facility] Advance Health Care Directives Advance Health Care Directives ([email or web address]   Next Medicare Annual Wellness Visit scheduled for next year: Yes  Preventive Care 65 Years and Older, Male Preventive care refers to lifestyle choices and visits with your health care provider that can promote health and wellness. Preventive care visits are also called wellness exams. What can I expect for my preventive care visit? Counseling During your preventive care visit, your health care provider may ask about your: Medical history, including: Past medical problems. Family medical history. History of falls. Current health, including: Emotional well-being. Home life and relationship well-being. Sexual activity. Memory and ability to understand (cognition). Lifestyle, including: Alcohol, nicotine or tobacco, and drug use. Access to firearms. Diet, exercise, and sleep habits. Work and work Astronomer. Sunscreen use. Safety issues such as seatbelt and bike helmet  use. Physical exam Your health care provider will check your: Height and weight. These may be used to calculate your BMI (body mass index). BMI is a measurement that tells if you are at a healthy weight. Waist circumference. This measures the distance around your waistline. This measurement also tells if you are at a healthy weight and may help predict your risk of certain diseases, such as type 2 diabetes and high blood pressure. Heart rate and blood pressure. Body temperature. Skin for abnormal spots. What immunizations do I need?  Vaccines are usually given at various ages, according to a schedule. Your health care provider will recommend vaccines for you based on your age, medical history, and lifestyle or other factors, such as travel or where you work. What tests do I need? Screening Your health care provider may recommend screening tests for certain conditions. This may include: Lipid and cholesterol levels. Diabetes screening. This is done by checking your blood sugar (glucose) after you have not eaten for a while (fasting). Hepatitis C test. Hepatitis B test. HIV (human immunodeficiency virus) test. STI (sexually transmitted infection) testing, if you are at risk. Lung cancer screening. Colorectal cancer screening. Prostate cancer screening. Abdominal aortic aneurysm (AAA) screening. You may need this if you are a current or former smoker. Talk with your health care provider about your test results, treatment options, and if necessary, the need for more tests. Follow these instructions at home: Eating and drinking  Eat a diet that includes fresh fruits and vegetables, whole grains, lean protein, and low-fat dairy products. Limit your intake of foods with high amounts of sugar, saturated fats, and salt. Take vitamin and mineral supplements as recommended  by your health care provider. Do not drink alcohol if your health care provider tells you not to drink. If you drink  alcohol: Limit how much you have to 0-2 drinks a day. Know how much alcohol is in your drink. In the [place] one drink equals one 12 oz bottle of beer (355 mL), one 5 oz glass of wine (148 mL), or one 1 oz glass of hard liquor (44 mL). Lifestyle Brush your teeth every morning and night with fluoride toothpaste. Floss one time each day. Exercise for at least 30 minutes 5 or more days each week. Do not use any products that contain nicotine or tobacco. These products include cigarettes, chewing tobacco, and vaping devices, such as e-cigarettes. If you need help quitting, ask your health care provider. Do not use drugs. If you are sexually active, practice safe sex. Use a condom or other form of protection to prevent STIs. Take aspirin only as told by your health care provider. Make sure that you understand how much to take and what form to take. Work with your health care provider to find out whether it is safe and beneficial for you to take aspirin daily. Ask your health care provider if you need to take a cholesterol-lowering medicine (statin). Find healthy ways to manage stress, such as: Meditation, yoga, or listening to music. Journaling. Talking to a trusted person. Spending time with friends and family. Safety Always wear your seat belt while driving or riding in a vehicle. Do not drive: If you have been drinking alcohol. Do not ride with someone who has been drinking. When you are tired or distracted. While texting. If you have been using any mind-altering substances or drugs. Wear a helmet and other protective equipment during sports activities. If you have firearms in your house, make sure you follow all gun safety procedures. Minimize exposure to UV radiation to reduce your risk of skin cancer. What's next? Visit your health care provider once a year for an annual wellness visit. Ask your health care provider how often you should have your eyes and teeth checked. Stay up to date  on all vaccines. This information is not intended to replace advice given to you by your health care provider. Make sure you discuss any questions you have with your health care provider. Document Revised: 09/28/2020 Document Reviewed: 09/28/2020 Elsevier Patient Education  2024 [employer]. Understanding Your Risk for Falls Millions of people have serious injuries from falls each year. It is important to understand your risk of falling. Talk with your health care provider about your risk and what you can do to lower it. If you do have a serious fall, make sure to tell your provider. Falling once raises your risk of falling again. How can falls affect me? Serious injuries from falls are common. These include: Broken bones, such as hip fractures. Head injuries, such as traumatic brain injuries (TBI) or concussions. A fear of falling can cause you to avoid activities and stay at home. This can make your muscles weaker and raise your risk for a fall. What can increase my risk? There are a number of risk factors that increase your risk for falling. The more risk factors you have, the higher your risk of falling. Serious injuries from a fall happen most often to people who are older than 72 years old. Teenagers and young adults ages 24-29 are also at higher risk. Common risk factors include: Weakness in the lower body. Being generally weak or confused due to  long-term (chronic) illness. Dizziness or balance problems. Poor vision. Medicines that cause dizziness or drowsiness. These may include: Medicines for your blood pressure, heart, anxiety, insomnia, or swelling (edema). Pain medicines. Muscle relaxants. Other risk factors include: Drinking alcohol. Having had a fall in the past. Having foot pain or wearing improper footwear. Working at a dangerous job. Having any of the following in your home: Tripping hazards, such as floor clutter or loose rugs. Poor lighting. Pets. Having dementia  or memory loss. What actions can I take to lower my risk of falling?     Physical activity Stay physically fit. Do strength and balance exercises. Consider taking a regular class to build strength and balance. Yoga and tai chi are good options. Vision Have your eyes checked every year and your prescription for glasses or contacts updated as needed. Shoes and walking aids Wear non-skid shoes. Wear shoes that have rubber soles and low heels. Do not wear high heels. Do not walk around the house in socks or slippers. Use a cane or walker as told by your provider. Home safety Attach secure railings on both sides of your stairs. Install grab bars for your bathtub, shower, and toilet. Use a non-skid mat in your bathtub or shower. Attach bath mats securely with double-sided, non-slip rug tape. Use good lighting in all rooms. Keep a flashlight near your bed. Make sure there is a clear path from your bed to the bathroom. Use night-lights. Do not use throw rugs. Make sure all carpeting is taped or tacked down securely. Remove all clutter from walkways and stairways, including extension cords. Repair uneven or broken steps and floors. Avoid walking on icy or slippery surfaces. Walk on the grass instead of on icy or slick sidewalks. Use ice melter to get rid of ice on walkways in the winter. Use a cordless phone. Questions to ask your health care provider Can you help me check my risk for a fall? Do any of my medicines make me more likely to fall? Should I take a vitamin D supplement? What exercises can I do to improve my strength and balance? Should I make an appointment to have my vision checked? Do I need a bone density test to check for weak bones (osteoporosis)? Would it help to use a cane or a walker? Where to find more information Centers for Disease Control and Prevention, STEADI: TonerPromos.no Community-Based Fall Prevention Programs: TonerPromos.no [employer] on Aging:  BaseRingTones.pl Contact a health care provider if: You fall at home. You are afraid of falling at home. You feel weak, drowsy, or dizzy. This information is not intended to replace advice given to you by your health care provider. Make sure you discuss any questions you have with your health care provider. Document Revised: 12/04/2021 Document Reviewed: 12/04/2021 Elsevier Patient Education  2024 [employer].

## 2023-03-06 NOTE — Progress Notes (Signed)
 Because this visit was a virtual/telehealth visit,  certain criteria was not obtained, such a blood pressure, CBG if applicable, and timed get up and go. Any medications not marked as "taking" were not mentioned during the medication reconciliation part of the visit. Any vitals not documented were not able to be obtained due to this being a telehealth visit or patient was unable to self-report a recent blood pressure reading due to a lack of equipment at home via telehealth. Vitals that have been documented are verbally provided by the patient.   Subjective:   Mark Sandoval is a 72 y.o. male who presents for Medicare Annual/Subsequent preventive examination.  Visit Complete: Virtual I connected with  Mark Sandoval on 03/06/23 by a audio enabled telemedicine application and verified that I am speaking with the correct person using two identifiers.  Patient Location: Home  Provider Location: Home Office  I discussed the limitations of evaluation and management by telemedicine. The patient expressed understanding and agreed to proceed.  Vital Signs: Because this visit was a virtual/telehealth visit, some criteria may be missing or patient reported. Any vitals not documented were not able to be obtained and vitals that have been documented are patient reported.  Patient Medicare AWV questionnaire was completed by the patient on 03/03/2023; I have confirmed that all information answered by patient is correct and no changes since this date.  Cardiac Risk Factors include: advanced age (>27men, >51 women);dyslipidemia;hypertension;male gender     Objective:    Today's Vitals   03/06/23 0817  Weight: 181 lb (82.1 kg)  Height: 6\' 1"  (1.854 m)   Body mass index is 23.88 kg/m.     03/06/2023    8:19 AM 11/08/2021    4:01 PM 09/23/2020    6:20 AM 09/21/2020    8:49 AM 02/04/2020    8:24 AM 04/10/2018    6:28 AM 01/30/2018    9:30 AM  Advanced Directives  Does Patient Have a  Medical Advance Directive? No No No Yes No Yes Yes  Type of Theme park manager;Living will  Healthcare Power of [place];Living will   Does patient want to make changes to medical advance directive?   No - Patient declined No - Patient declined  No - Patient declined   Copy of Healthcare Power of Attorney in Chart?    No - copy requested  No - copy requested   Would patient like information on creating a medical advance directive? No - Patient declined Yes (ED - Information included in AVS)   Yes (ED - Information included in AVS)      Current Medications (verified) Outpatient Encounter Medications as of 03/06/2023  Medication Sig   amLODipine (NORVASC) 2.5 MG tablet Take 1 tablet (2.5 mg total) by mouth daily.   amLODipine (NORVASC) 5 MG tablet Take 1 tablet (5 mg total) by mouth daily.   Multiple Vitamin (MULTIVITAMIN) tablet Take 1 tablet by mouth daily.   rosuvastatin (CRESTOR) 5 MG tablet Take 1 tablet (5 mg total) by mouth daily.   No facility-administered encounter medications on file as of 03/06/2023.    Allergies (verified) Compazine and Daucus carota   History: Past Medical History:  Diagnosis Date   Allergy    Dyslipidemia    GERD (gastroesophageal reflux disease)    History of seasonal allergies    Hypertension    Leukopenia    Lump or mass in breast    benign   Other spondylosis with  radiculopathy, cervical region    Thrombocytopenia [facility])    Past Surgical History:  Procedure Laterality Date   athroscopy of left knee  04/16/2000   CHOLECYSTECTOMY  04/16/2005   COLONOSCOPY N/A 03/29/2016   Procedure: COLONOSCOPY;  Surgeon: Malissa Hippo, MD;  Location: [facility];  Service: Endoscopy;  Laterality: N/A;  1030   LIPOMA EXCISION N/A 09/23/2020   Procedure: EXCISION LIPOMA; abdominal wall;  Surgeon: Franky Macho, MD;  Location: [facility] ORS;  Service: General;  Laterality: N/A;   removal of benign left breast lump  09/06/2007    Family History  Problem Relation Age of Onset   Cancer Mother        hematologic cancer    Hypertension Mother    Alcohol abuse Father    Diabetes Sister    Social History   Socioeconomic History   Marital status: Married    Spouse name: Not on file   Number of children: 4   Years of education: DDS   Highest education level: Professional school degree (e.g., MD, DDS, DVM, JD)  Occupational History   Occupation: Geographical information systems officer: SELF-EMPLOYED    Comment: retired   Tobacco Use   Smoking status: Never   Smokeless tobacco: Never  Substance and Sexual Activity   Alcohol use: Yes    Comment: Mostly Social Events   Drug use: No   Sexual activity: Yes  Other Topics Concern   Not on file  Social History Narrative   Not on file   Social Determinants of Health   Financial Resource Strain: Low Risk  (03/03/2023)   Overall Financial Resource Strain (CARDIA)    Difficulty of Paying Living Expenses: Not hard at all  Food Insecurity: No Food Insecurity (03/03/2023)   Hunger Vital Sign    Worried About Running Out of Food in the Last Year: Never true    Ran Out of Food in the Last Year: Never true  Transportation Needs: No Transportation Needs (03/03/2023)   PRAPARE - Administrator, Civil Service (Medical): No    Lack of Transportation (Non-Medical): No  Physical Activity: Sufficiently Active (03/03/2023)   Exercise Vital Sign    Days of Exercise per Week: 3 days    Minutes of Exercise per Session: 120 min  Stress: No Stress Concern Present (03/03/2023)   [employer] of Occupational Health - Occupational Stress Questionnaire    Feeling of Stress : Not at all  Social Connections: Socially Integrated (03/03/2023)   Social Connection and Isolation Panel [NHANES]    Frequency of Communication with Friends and Family: More than three times a week    Frequency of Social Gatherings with Friends and Family: Twice a week    Attends Religious Services: More  than 4 times per year    Active Member of [employer] or Organizations: Yes    Attends Engineer, structural: More than 4 times per year    Marital Status: Married    Tobacco Counseling Counseling given: Yes   Clinical Intake:  Pre-visit preparation completed: Yes  Pain : No/denies pain     BMI - recorded: 23.88 Nutritional Status: BMI of 19-24  Normal Nutritional Risks: None Diabetes: No  How often do you need to have someone help you when you read instructions, pamphlets, or other written materials from your doctor or pharmacy?: 1 - Never  Interpreter Needed?: No  Information entered by ::  Larna Capelle, CMA   Activities of Daily Living  03/03/2023   10:36 AM  In your present state of health, do you have any difficulty performing the following activities:  Hearing? 0  Vision? 0  Difficulty concentrating or making decisions? 0  Walking or climbing stairs? 0  Dressing or bathing? 0  Doing errands, shopping? 0  Preparing Food and eating ? N  Using the Toilet? N  In the past six months, have you accidently leaked urine? N  Do you have problems with loss of bowel control? N  Managing your Medications? N  Managing your Finances? N  Housekeeping or managing your Housekeeping? N    Patient Care Team: Kerri Perches, MD as PCP - General  Indicate any recent Medical Services you may have received from other than [facility] providers in the past year (date may be approximate).     Assessment:   This is a routine wellness examination for [place].  Hearing/Vision screen Hearing Screening - Comments:: Patient denies any hearing difficulties.   Vision Screening - Comments:: Wears rx glasses - up to date with routine eye exams  Sees a provider at [facility]   Goals Addressed             This Visit's Progress    Patient Stated       Play more golf.        Depression Screen    03/06/2023    8:22 AM 10/24/2022    3:42 PM 08/29/2022    8:12 AM  02/27/2022    8:10 AM 10/12/2021    1:53 PM 08/24/2021    8:22 AM 02/23/2021    9:58 AM  PHQ 2/9 Scores  PHQ - 2 Score 0 0 0 0 0 0 0  PHQ- 9 Score 0      0    Fall Risk    03/03/2023   10:36 AM 10/24/2022    3:42 PM 08/29/2022    8:11 AM 02/27/2022    8:10 AM 11/05/2021   10:24 AM  Fall Risk   Falls in the past year?  0 0 0 0  Number falls in past yr: 0 0 0 0 0  Injury with Fall? 0 0 0 0 0  Risk for fall due to :  No Fall Risks No Fall Risks No Fall Risks   Follow up Falls prevention discussed Falls evaluation completed Falls evaluation completed Falls evaluation completed     MEDICARE RISK AT HOME: Medicare Risk at Home Any stairs in or around the home?: Yes If so, are there any without handrails?: No Home free of loose throw rugs in walkways, pet beds, electrical cords, etc?: Yes Adequate lighting in your home to reduce risk of falls?: Yes Life alert?: No Use of a cane, walker or w/c?: No Grab bars in the bathroom?: No Shower chair or bench in shower?: Yes Elevated toilet seat or a handicapped toilet?: No  TIMED UP AND GO:  Was the test performed?  No    Cognitive Function:        03/06/2023    8:20 AM 02/04/2020    8:18 AM 02/04/2020    8:17 AM 02/03/2019    8:11 AM 01/30/2018    9:32 AM  6CIT Screen  What Year? 0 points 0 points 0 points 0 points 0 points  What month? 0 points 0 points 0 points 0 points 0 points  What time? 0 points 0 points 0 points 0 points 0 points  Count back from 20 0 points 0 points  0 points 0 points  Months in reverse 0 points 0 points  0 points 2 points  Repeat phrase 0 points 0 points  0 points 0 points  Total Score 0 points 0 points  0 points 2 points    Immunizations Immunization History  Administered Date(s) Administered   Fluad Quad(high Dose 65+) 12/15/2018, 02/03/2020, 01/12/2021   H1N1 02/06/2008   Influenza Split 02/02/2011, 02/15/2012   Influenza Whole 01/17/2007, 01/13/2010   Influenza, High Dose Seasonal PF  01/10/2018   Influenza,inj,Quad PF,6+ Mos 01/15/2014, 02/02/2015, 01/05/2016, 01/01/2017   Influenza-Unspecified 01/26/2016, 01/04/2022   Moderna Covid-19 Vaccine Bivalent Booster 102yrs & up 08/08/2021   Moderna SARS-COV2 Booster Vaccination 08/08/2021   Moderna Sars-Covid-2 Vaccination 01/05/2022   PFIZER(Purple Top)SARS-COV-2 Vaccination 05/21/2019, 06/15/2019, 01/04/2020, 07/19/2020, 01/01/2021   Pneumococcal Conjugate-13 01/26/2016   Pneumococcal Polysaccharide-23 07/31/2017   Td 05/03/2006   Tdap 01/01/2017   Zoster, Live 02/02/2011    TDAP status: Up to date  Flu Vaccine status: Up to date  Pneumococcal vaccine status: Up to date  Covid-19 vaccine status: Information provided on how to obtain vaccines.   Qualifies for Shingles Vaccine? Yes   Zostavax completed Yes   Shingrix Completed?: No.    Education has been provided regarding the importance of this vaccine. Patient has been advised to call insurance company to determine out of pocket expense if they have not yet received this vaccine. Advised may also receive vaccine at local pharmacy or Health Dept. Verbalized acceptance and understanding.  Screening Tests Health Maintenance  Topic Date Due   Zoster Vaccines- Shingrix (1 of 2) [birth year]07/1970   Medicare Annual Wellness (AWV)  11/09/2022   COVID-19 Vaccine (9 - 2023-24 season) 12/16/2022   Colonoscopy  03/29/2026   DTaP/Tdap/Td (3 - Td or Tdap) 01/02/2027   Pneumonia Vaccine 91+ Years old  Completed   INFLUENZA VACCINE  Completed   Hepatitis C Screening  Completed   HPV VACCINES  Aged Out    Health Maintenance  Health Maintenance Due  Topic Date Due   Zoster Vaccines- Shingrix (1 of 2) 01[birth year]07[birth year]1970   Medicare Annual Wellness (AWV)  11/09/2022   COVID-19 Vaccine (9 - 2023-24 season) 12/16/2022    Colorectal cancer screening: Type of screening: Colonoscopy. Completed 04/01/2016. Repeat every 10 years  Lung Cancer Screening: (Low Dose CT Chest recommended if Age  44-80 years, 20 pack-year currently smoking OR have quit w/in 15years.) does not qualify.   Lung Cancer Screening Referral: na  Additional Screening:  Hepatitis C Screening: does not qualify; Completed 02/23/2015  Vision Screening: Recommended annual ophthalmology exams for early detection of glaucoma and other disorders of the eye. Is the patient up to date with their annual eye exam?  Yes  Who is the provider or what is the name of the office in which the patient attends annual eye exams? [facility] If pt is not established with a provider, would they like to be referred to a provider to establish care? No .   Dental Screening: Recommended annual dental exams for proper oral hygiene  Diabetic Foot Exam: na  Community Resource Referral / Chronic Care Management: CRR required this visit?  No   CCM required this visit?  No     Plan:     I have personally reviewed and noted the following in the patient's chart:   Medical and social history Use of alcohol, tobacco or illicit drugs  Current medications and supplements including opioid prescriptions. Patient is not currently taking opioid prescriptions. Functional ability and  status Nutritional status Physical activity Advanced directives List of other physicians Hospitalizations, surgeries, and ER visits in previous 12 months Vitals Screenings to include cognitive, depression, and falls Referrals and appointments  In addition, I have reviewed and discussed with patient certain preventive protocols, quality metrics, and best practice recommendations. A written personalized care plan for preventive services as well as general preventive health recommendations were provided to patient.     Jordan Hawks Hogan Hoobler, CMA   03/06/2023   After Visit Summary: (MyChart) Due to this being a telephonic visit, the after visit summary with patients personalized plan was offered to patient via MyChart   Nurse Notes: none

## 2023-03-08 DIAGNOSIS — Z125 Encounter for screening for malignant neoplasm of prostate: Secondary | ICD-10-CM | POA: Diagnosis not present

## 2023-03-08 DIAGNOSIS — D472 Monoclonal gammopathy: Secondary | ICD-10-CM | POA: Diagnosis not present

## 2023-03-08 DIAGNOSIS — R972 Elevated prostate specific antigen [PSA]: Secondary | ICD-10-CM | POA: Diagnosis not present

## 2023-03-08 DIAGNOSIS — D709 Neutropenia, unspecified: Secondary | ICD-10-CM | POA: Diagnosis not present

## 2023-03-08 DIAGNOSIS — D696 Thrombocytopenia, unspecified: Secondary | ICD-10-CM | POA: Diagnosis not present

## 2023-03-08 DIAGNOSIS — Z79899 Other long term (current) drug therapy: Secondary | ICD-10-CM | POA: Diagnosis not present

## 2023-03-25 ENCOUNTER — Other Ambulatory Visit: Payer: Self-pay

## 2023-03-25 ENCOUNTER — Encounter: Payer: Self-pay | Admitting: Family Medicine

## 2023-03-25 DIAGNOSIS — E785 Hyperlipidemia, unspecified: Secondary | ICD-10-CM

## 2023-03-25 DIAGNOSIS — E559 Vitamin D deficiency, unspecified: Secondary | ICD-10-CM

## 2023-03-25 DIAGNOSIS — I10 Essential (primary) hypertension: Secondary | ICD-10-CM

## 2023-03-25 NOTE — Telephone Encounter (Signed)
Labs ordered.

## 2023-03-26 ENCOUNTER — Other Ambulatory Visit (HOSPITAL_COMMUNITY)
Admission: RE | Admit: 2023-03-26 | Discharge: 2023-03-26 | Disposition: A | Discharge status: Home / Self Care | Payer: Medicare Other | Source: Ambulatory Visit | Attending: Family Medicine | Admitting: Family Medicine

## 2023-03-26 DIAGNOSIS — E785 Hyperlipidemia, unspecified: Secondary | ICD-10-CM

## 2023-03-26 DIAGNOSIS — I10 Essential (primary) hypertension: Secondary | ICD-10-CM | POA: Insufficient documentation

## 2023-03-26 DIAGNOSIS — D72819 Decreased white blood cell count, unspecified: Secondary | ICD-10-CM | POA: Insufficient documentation

## 2023-03-26 LAB — COMPREHENSIVE METABOLIC PANEL
ALT: 28 U/L (ref 0–44)
AST: 25 U/L (ref 15–41)
Albumin: 3.9 g/dL (ref 3.5–5.0)
Alkaline Phosphatase: 75 U/L (ref 38–126)
Anion gap: 9 (ref 5–15)
BUN: 15 mg/dL (ref 8–23)
CO2: 24 mmol/L (ref 22–32)
Calcium: 8.9 mg/dL (ref 8.9–10.3)
Chloride: 104 mmol/L (ref 98–111)
Creatinine, Ser: 1.18 mg/dL (ref 0.61–1.24)
GFR, Estimated: >60 mL/min (ref >60)
Glucose, Bld: 95 mg/dL (ref 70–99)
Potassium: 4 mmol/L (ref 3.5–5.1)
Sodium: 137 mmol/L (ref 135–145)
Total Bilirubin: 0.8 mg/dL (ref <1.2)
Total Protein: 6.8 g/dL (ref 6.5–8.1)

## 2023-03-26 LAB — CBC WITH DIFFERENTIAL/PLATELET
Abs Immature Granulocytes: 0.01 10*3/uL (ref 0.00–0.07)
Basophils Absolute: 0 10*3/uL (ref 0.0–0.1)
Basophils Relative: 1 %
Eosinophils Absolute: 0.1 10*3/uL (ref 0.0–0.5)
Eosinophils Relative: 2 %
HCT: 39.2 % (ref 39.0–52.0)
Hemoglobin: 13.2 g/dL (ref 13.0–17.0)
Immature Granulocytes: 0 %
Lymphocytes Relative: 37 %
Lymphs Abs: 1.2 10*3/uL (ref 0.7–4.0)
MCH: 22.8 pg — ABNORMAL LOW (ref 26.0–34.0)
MCHC: 33.7 g/dL (ref 30.0–36.0)
MCV: 67.6 fL — ABNORMAL LOW (ref 80.0–100.0)
Monocytes Absolute: 0.3 10*3/uL (ref 0.1–1.0)
Monocytes Relative: 8 %
Neutro Abs: 1.7 10*3/uL (ref 1.7–7.7)
Neutrophils Relative %: 52 %
Platelets: 147 10*3/uL — ABNORMAL LOW (ref 150–400)
RBC: 5.8 MIL/uL (ref 4.22–5.81)
RDW: 15.9 % — ABNORMAL HIGH (ref 11.5–15.5)
WBC: 3.3 10*3/uL — ABNORMAL LOW (ref 4.0–10.5)
nRBC: 0 % (ref 0.0–0.2)

## 2023-03-26 LAB — LIPID PANEL
Cholesterol: 125 mg/dL (ref 0–200)
HDL: 62 mg/dL (ref >40)
LDL Cholesterol: 55 mg/dL (ref 0–99)
Total CHOL/HDL Ratio: 2 {ratio}
Triglycerides: 41 mg/dL (ref <150)
VLDL: 8 mg/dL (ref 0–40)

## 2023-03-29 ENCOUNTER — Encounter: Payer: Self-pay | Admitting: Family Medicine

## 2023-03-29 ENCOUNTER — Ambulatory Visit (INDEPENDENT_AMBULATORY_CARE_PROVIDER_SITE_OTHER): Payer: Medicare Other | Admitting: Family Medicine

## 2023-03-29 VITALS — BP 129/85 | HR 72 | Ht 74.0 in | Wt 188.0 lb

## 2023-03-29 DIAGNOSIS — D472 Monoclonal gammopathy: Secondary | ICD-10-CM | POA: Diagnosis not present

## 2023-03-29 DIAGNOSIS — H9202 Otalgia, left ear: Secondary | ICD-10-CM | POA: Diagnosis not present

## 2023-03-29 DIAGNOSIS — E785 Hyperlipidemia, unspecified: Secondary | ICD-10-CM | POA: Diagnosis not present

## 2023-03-29 DIAGNOSIS — I10 Essential (primary) hypertension: Secondary | ICD-10-CM | POA: Diagnosis not present

## 2023-03-29 MED ORDER — HYDROCORTISONE-ACETIC ACID 1-2 % OT SOLN: OTIC | 0 refills | Status: DC

## 2023-03-29 NOTE — Patient Instructions (Signed)
Follow-up as before, call if you need me sooner.  Excellent labs and blood pressure.  Steak  needs to be emjoyed  early in the day since at night this sits in your stomach and causes discomfort, this is not uncommon, especially in healthy eaters like yourself  Congrats on excellent labs.  Nurse pls document most recent covid vaccine  You do still need the shingrix vaccines  Eardrop is prescribed for use twice daily for the next week then as needed to be applied to the affected ear only.  Please send a message if you continue to have concerns regarding the ear, you will be referred to ear nose and throat if this is the case.  You will need fasting lipid CMP and EGFR TSH , CBC, and vitamin D to be drawn 3 to 5 days before your follow-up in June 2025.  Labs need to be drawn at the hospital.  Best for 2025!  Thanks for choosing [facility], we consider it a privelige to serve you.

## 2023-03-29 NOTE — Assessment & Plan Note (Signed)
Topical anti inflammatory x 1 week then as needed, pt to call if pwersists , will refer to ENT

## 2023-03-31 NOTE — Assessment & Plan Note (Signed)
Hyperlipidemia:Low fat diet discussed and encouraged.   Lipid Panel  Lab Results  Component Value Date   CHOL 125 03/26/2023   HDL 62 03/26/2023   LDLCALC 55 03/26/2023   TRIG 41 03/26/2023   CHOLHDL 2.0 03/26/2023     Controlled, no change in medication

## 2023-03-31 NOTE — Progress Notes (Signed)
Mark Sandoval     MRN: [id number]      DOB: [birth year]  Chief Complaint  Patient presents with   Follow-up    Follow up concerned with left ear gets sore, stomach issues with eating dinner.     HPI Mark Sandoval is here for follow up and re-evaluation of chronic medical conditions, medication management and review of any available recent lab and radiology data.  Preventive health is updated, specifically  Cancer screening and Immunization.   Questions or concerns regarding consultations or procedures which the PT has had in the interim are  addressed. Complains of recurrent intermittent left ear discomfort, he has been evaluated by ENT in the past on more than one occasion for this.  And he states that no abnormalities this covered however he is concerned that the discomfort is in the outer area on the doctors are looking down into the inner ear.  There is no report of hearing loss or drainage from the ear.  He denies uncontrolled sinus and allergy symptoms.  Complains of inability to flat the chest even a small amount of steak for an evening meal.  Recently had heaviness and heartburn type symptoms a small serving of steak later in the evening.  He does state that when he eats steak earlier in the day he does not experience this problem.  No other GI concerns, specifically denies recurrent nausea heartburn vomiting or change in bowel movement. ROS Denies recent fever or chills. Denies sinus pressure, nasal congestion, or sore throat. Denies chest congestion, productive cough or wheezing. Denies chest pains, palpitations and leg swelling Denies vomiting,diarrhea or constipation.   Denies dysuria, frequency, hesitancy or incontinence.  Denies depression, anxiety or insomnia. Denies skin break down or rash.   PE  BP 129/85 (BP Location: Right Arm, Patient Position: Sitting, Cuff Size: Normal)   Pulse 72   Ht 6\' 2"  (1.88 m)   Wt 188 lb (85.3 kg)   SpO2 98%   BMI 24.14 kg/m    Patient alert and oriented and in no cardiopulmonary distress.  HEENT: No facial asymmetry, EOMI,     Neck supple no adenopathy, no thyromegaly, TM clear bilaterally with good light reflex. Localized tenderness in left outer ear canal which is the localized area of concern  Chest: Clear to auscultation bilaterally.  CVS: S1, S2 no murmurs, no S3.Regular rate.   Ext: No edema  MS: Adequate ROM spine, shoulders, hips and knees.  Skin: Intact, no ulcerations or rash noted.  Psych: Good eye contact, normal affect. Memory intact not anxious or depressed appearing.  CNS: CN 2-12 intact, power,  normal throughout.no focal deficits noted.   Assessment & Plan  Otalgia, left Topical anti inflammatory x 1 week then as needed, pt to call if pwersists , will refer to ENT  Essential hypertension Controlled, no change in medication DASH diet and commitment to daily physical activity for a minimum of 30 minutes discussed and encouraged, as a part of hypertension management. The importance of attaining a healthy weight is also discussed.     03/29/2023   10:52 AM 03/06/2023    8:17 AM 11/01/2022    5:16 PM 11/01/2022    4:56 PM 10/24/2022    4:18 PM 10/24/2022    4:15 PM 10/24/2022    3:41 PM  BP/Weight  Systolic BP 129 -- 131 144 128 130 117  Diastolic BP 85 -- 91 101 82 82 78  Wt. (Lbs) 188 181  180.08  BMI 24.14 kg/m2 23.88 kg/m2     23.12 kg/m2     '  Dyslipidemia, goal LDL below 100 Hyperlipidemia:Low fat diet discussed and encouraged.   Lipid Panel  Lab Results  Component Value Date   CHOL 125 03/26/2023   HDL 62 03/26/2023   LDLCALC 55 03/26/2023   TRIG 41 03/26/2023   CHOLHDL 2.0 03/26/2023     Controlled, no change in medication   MGUS (monoclonal gammopathy of unknown significance) Followed regularly , stable currently

## 2023-03-31 NOTE — Assessment & Plan Note (Signed)
Controlled, no change in medication DASH diet and commitment to daily physical activity for a minimum of 30 minutes discussed and encouraged, as a part of hypertension management. The importance of attaining a healthy weight is also discussed.     03/29/2023   10:52 AM 03/06/2023    8:17 AM 11/01/2022    5:16 PM 11/01/2022    4:56 PM 10/24/2022    4:18 PM 10/24/2022    4:15 PM 10/24/2022    3:41 PM  BP/Weight  Systolic BP 129 -- 131 144 128 130 117  Diastolic BP 85 -- 91 101 82 82 78  Wt. (Lbs) 188 181     180.08  BMI 24.14 kg/m2 23.88 kg/m2     23.12 kg/m2     '

## 2023-03-31 NOTE — Assessment & Plan Note (Signed)
Followed regularly , stable currently

## 2023-04-04 ENCOUNTER — Other Ambulatory Visit: Payer: Self-pay | Admitting: Family Medicine

## 2023-04-04 MED ORDER — ACETIC ACID 2 % OT SOLN: OTIC | 0 refills | Status: DC

## 2023-04-29 ENCOUNTER — Other Ambulatory Visit: Payer: Self-pay | Admitting: Family Medicine

## 2023-05-03 ENCOUNTER — Other Ambulatory Visit: Payer: Self-pay | Admitting: Family Medicine

## 2023-05-09 DIAGNOSIS — N138 Other obstructive and reflux uropathy: Secondary | ICD-10-CM | POA: Diagnosis not present

## 2023-05-09 DIAGNOSIS — N401 Enlarged prostate with lower urinary tract symptoms: Secondary | ICD-10-CM | POA: Diagnosis not present

## 2023-05-09 DIAGNOSIS — R972 Elevated prostate specific antigen [PSA]: Secondary | ICD-10-CM | POA: Diagnosis not present

## 2023-05-20 ENCOUNTER — Encounter: Payer: Self-pay | Admitting: Family Medicine

## 2023-05-22 ENCOUNTER — Other Ambulatory Visit: Payer: Self-pay | Admitting: Family Medicine

## 2023-06-20 DIAGNOSIS — N182 Chronic kidney disease, stage 2 (mild): Secondary | ICD-10-CM | POA: Diagnosis not present

## 2023-06-20 DIAGNOSIS — I10 Essential (primary) hypertension: Secondary | ICD-10-CM | POA: Diagnosis not present

## 2023-07-26 DIAGNOSIS — Z23 Encounter for immunization: Secondary | ICD-10-CM | POA: Diagnosis not present

## 2023-08-13 DIAGNOSIS — H40013 Open angle with borderline findings, low risk, bilateral: Secondary | ICD-10-CM | POA: Diagnosis not present

## 2023-08-13 DIAGNOSIS — H3589 Other specified retinal disorders: Secondary | ICD-10-CM | POA: Diagnosis not present

## 2023-08-13 DIAGNOSIS — H2513 Age-related nuclear cataract, bilateral: Secondary | ICD-10-CM | POA: Diagnosis not present

## 2023-08-13 DIAGNOSIS — H359 Unspecified retinal disorder: Secondary | ICD-10-CM | POA: Diagnosis not present

## 2023-08-28 DIAGNOSIS — D472 Monoclonal gammopathy: Secondary | ICD-10-CM | POA: Diagnosis not present

## 2023-09-06 ENCOUNTER — Inpatient Hospital Stay (HOSPITAL_COMMUNITY)

## 2023-09-06 ENCOUNTER — Other Ambulatory Visit: Payer: Self-pay

## 2023-09-06 ENCOUNTER — Inpatient Hospital Stay (HOSPITAL_COMMUNITY)
Admission: EM | Admit: 2023-09-06 | Discharge: 2023-09-08 | DRG: 800 | Disposition: A | Discharge status: Home / Self Care | Attending: Surgery | Admitting: Surgery

## 2023-09-06 ENCOUNTER — Emergency Department (HOSPITAL_COMMUNITY)

## 2023-09-06 ENCOUNTER — Encounter (HOSPITAL_COMMUNITY): Payer: Self-pay | Admitting: Emergency Medicine

## 2023-09-06 DIAGNOSIS — S199XXA Unspecified injury of neck, initial encounter: Secondary | ICD-10-CM | POA: Diagnosis not present

## 2023-09-06 DIAGNOSIS — D696 Thrombocytopenia, unspecified: Secondary | ICD-10-CM | POA: Diagnosis not present

## 2023-09-06 DIAGNOSIS — T80818A Extravasation of other vesicant agent, initial encounter: Secondary | ICD-10-CM | POA: Diagnosis not present

## 2023-09-06 DIAGNOSIS — Z888 Allergy status to other drugs, medicaments and biological substances status: Secondary | ICD-10-CM

## 2023-09-06 DIAGNOSIS — R0781 Pleurodynia: Secondary | ICD-10-CM | POA: Diagnosis not present

## 2023-09-06 DIAGNOSIS — S36031A Moderate laceration of spleen, initial encounter: Secondary | ICD-10-CM | POA: Diagnosis not present

## 2023-09-06 DIAGNOSIS — D62 Acute posthemorrhagic anemia: Secondary | ICD-10-CM | POA: Diagnosis not present

## 2023-09-06 DIAGNOSIS — M25512 Pain in left shoulder: Secondary | ICD-10-CM | POA: Diagnosis not present

## 2023-09-06 DIAGNOSIS — I10 Essential (primary) hypertension: Secondary | ICD-10-CM | POA: Diagnosis present

## 2023-09-06 DIAGNOSIS — N178 Other acute kidney failure: Secondary | ICD-10-CM | POA: Diagnosis not present

## 2023-09-06 DIAGNOSIS — Z8249 Family history of ischemic heart disease and other diseases of the circulatory system: Secondary | ICD-10-CM

## 2023-09-06 DIAGNOSIS — E785 Hyperlipidemia, unspecified: Secondary | ICD-10-CM | POA: Diagnosis present

## 2023-09-06 DIAGNOSIS — N281 Cyst of kidney, acquired: Secondary | ICD-10-CM | POA: Diagnosis not present

## 2023-09-06 DIAGNOSIS — S36032A Major laceration of spleen, initial encounter: Secondary | ICD-10-CM | POA: Diagnosis not present

## 2023-09-06 DIAGNOSIS — R079 Chest pain, unspecified: Secondary | ICD-10-CM | POA: Diagnosis not present

## 2023-09-06 DIAGNOSIS — N179 Acute kidney failure, unspecified: Secondary | ICD-10-CM | POA: Diagnosis not present

## 2023-09-06 DIAGNOSIS — S36899A Unspecified injury of other intra-abdominal organs, initial encounter: Secondary | ICD-10-CM | POA: Diagnosis present

## 2023-09-06 DIAGNOSIS — Z833 Family history of diabetes mellitus: Secondary | ICD-10-CM

## 2023-09-06 DIAGNOSIS — Y92410 Unspecified street and highway as the place of occurrence of the external cause: Secondary | ICD-10-CM

## 2023-09-06 DIAGNOSIS — S36030A Superficial (capsular) laceration of spleen, initial encounter: Secondary | ICD-10-CM | POA: Diagnosis not present

## 2023-09-06 DIAGNOSIS — R0789 Other chest pain: Secondary | ICD-10-CM | POA: Diagnosis not present

## 2023-09-06 DIAGNOSIS — K219 Gastro-esophageal reflux disease without esophagitis: Secondary | ICD-10-CM | POA: Diagnosis present

## 2023-09-06 DIAGNOSIS — S36029A Unspecified contusion of spleen, initial encounter: Secondary | ICD-10-CM | POA: Diagnosis not present

## 2023-09-06 DIAGNOSIS — S36039A Unspecified laceration of spleen, initial encounter: Secondary | ICD-10-CM | POA: Diagnosis present

## 2023-09-06 DIAGNOSIS — Z79899 Other long term (current) drug therapy: Secondary | ICD-10-CM

## 2023-09-06 DIAGNOSIS — S0990XA Unspecified injury of head, initial encounter: Secondary | ICD-10-CM | POA: Diagnosis not present

## 2023-09-06 DIAGNOSIS — R109 Unspecified abdominal pain: Secondary | ICD-10-CM | POA: Diagnosis not present

## 2023-09-06 DIAGNOSIS — S3600XA Unspecified injury of spleen, initial encounter: Secondary | ICD-10-CM | POA: Diagnosis not present

## 2023-09-06 DIAGNOSIS — M549 Dorsalgia, unspecified: Secondary | ICD-10-CM | POA: Diagnosis not present

## 2023-09-06 HISTORY — PX: IR ANGIOGRAM VISCERAL SELECTIVE: IMG657

## 2023-09-06 HISTORY — PX: IR US GUIDE VASC ACCESS RIGHT: IMG2390

## 2023-09-06 HISTORY — PX: IR EMBO ART  VEN HEMORR LYMPH EXTRAV  INC GUIDE ROADMAPPING: IMG5450

## 2023-09-06 HISTORY — PX: IR ANGIOGRAM SELECTIVE EACH ADDITIONAL VESSEL: IMG667

## 2023-09-06 LAB — COMPREHENSIVE METABOLIC PANEL WITH GFR
ALT: 28 U/L (ref 0–44)
AST: 30 U/L (ref 15–41)
Albumin: 4 g/dL (ref 3.5–5.0)
Alkaline Phosphatase: 93 U/L (ref 38–126)
Anion gap: 7 (ref 5–15)
BUN: 21 mg/dL (ref 8–23)
CO2: 27 mmol/L (ref 22–32)
Calcium: 8.8 mg/dL — ABNORMAL LOW (ref 8.9–10.3)
Chloride: 103 mmol/L (ref 98–111)
Creatinine, Ser: 1.23 mg/dL (ref 0.61–1.24)
GFR, Estimated: >60 mL/min (ref >60)
Glucose, Bld: 100 mg/dL — ABNORMAL HIGH (ref 70–99)
Potassium: 3.8 mmol/L (ref 3.5–5.1)
Sodium: 137 mmol/L (ref 135–145)
Total Bilirubin: 0.8 mg/dL (ref 0.0–1.2)
Total Protein: 7 g/dL (ref 6.5–8.1)

## 2023-09-06 LAB — TROPONIN I (HIGH SENSITIVITY)
Troponin I (High Sensitivity): 4 ng/L (ref <18)
Troponin I (High Sensitivity): 5 ng/L (ref <18)

## 2023-09-06 LAB — CBC WITH DIFFERENTIAL/PLATELET
Abs Immature Granulocytes: 0.02 10*3/uL (ref 0.00–0.07)
Basophils Absolute: 0 10*3/uL (ref 0.0–0.1)
Basophils Relative: 1 %
Eosinophils Absolute: 0 10*3/uL (ref 0.0–0.5)
Eosinophils Relative: 1 %
HCT: 41.7 % (ref 39.0–52.0)
Hemoglobin: 14.1 g/dL (ref 13.0–17.0)
Immature Granulocytes: 0 %
Lymphocytes Relative: 14 %
Lymphs Abs: 0.7 10*3/uL (ref 0.7–4.0)
MCH: 23.2 pg — ABNORMAL LOW (ref 26.0–34.0)
MCHC: 33.8 g/dL (ref 30.0–36.0)
MCV: 68.6 fL — ABNORMAL LOW (ref 80.0–100.0)
Monocytes Absolute: 0.3 10*3/uL (ref 0.1–1.0)
Monocytes Relative: 7 %
Neutro Abs: 3.8 10*3/uL (ref 1.7–7.7)
Neutrophils Relative %: 77 %
Platelets: 128 10*3/uL — ABNORMAL LOW (ref 150–400)
RBC: 6.08 MIL/uL — ABNORMAL HIGH (ref 4.22–5.81)
RDW: 17.7 % — ABNORMAL HIGH (ref 11.5–15.5)
WBC: 4.9 10*3/uL (ref 4.0–10.5)
nRBC: 0 % (ref 0.0–0.2)

## 2023-09-06 LAB — PROTIME-INR
INR: 1.1 (ref 0.8–1.2)
Prothrombin Time: 14.1 s (ref 11.4–15.2)

## 2023-09-06 LAB — APTT: aPTT: 25 s (ref 24–36)

## 2023-09-06 LAB — PREPARE RBC (CROSSMATCH)

## 2023-09-06 LAB — CBG MONITORING, ED: Glucose-Capillary: 98 mg/dL (ref 70–99)

## 2023-09-06 LAB — ABO/RH: ABO/RH(D): AB POS

## 2023-09-06 MED ORDER — FENTANYL CITRATE (PF) 100 MCG/2ML IJ SOLN
INTRAMUSCULAR | Status: AC | PRN
  Administered 2023-09-06: 50 ug via INTRAVENOUS

## 2023-09-06 MED ORDER — DOCUSATE SODIUM 100 MG PO CAPS: 100.0000 mg | ORAL_CAPSULE | Freq: Two times a day (BID) | ORAL | Status: DC

## 2023-09-06 MED ORDER — HYDRALAZINE HCL 20 MG/ML IJ SOLN: 10.0000 mg | INTRAMUSCULAR | Status: DC | PRN

## 2023-09-06 MED ORDER — ONDANSETRON HCL 4 MG/2ML IJ SOLN
4.0000 mg | Freq: Four times a day (QID) | INTRAMUSCULAR | Status: DC | PRN
  Administered 2023-09-06: 4 mg via INTRAVENOUS
  Filled 2023-09-06: qty 2

## 2023-09-06 MED ORDER — SODIUM CHLORIDE 0.9 % IV BOLUS
500.0000 mL | Freq: Once | INTRAVENOUS | Status: AC
  Administered 2023-09-06: 500 mL via INTRAVENOUS

## 2023-09-06 MED ORDER — METHOCARBAMOL 500 MG PO TABS: 1000.0000 mg | ORAL_TABLET | Freq: Three times a day (TID) | ORAL | Status: DC

## 2023-09-06 MED ORDER — IOHEXOL 300 MG/ML  SOLN
100.0000 mL | Freq: Once | INTRAMUSCULAR | Status: AC | PRN
  Administered 2023-09-06: 20 mL via INTRA_ARTERIAL

## 2023-09-06 MED ORDER — ONDANSETRON 4 MG PO TBDP: 4.0000 mg | ORAL_TABLET | Freq: Four times a day (QID) | ORAL | Status: DC | PRN

## 2023-09-06 MED ORDER — LIDOCAINE HCL 1 % IJ SOLN
INTRAMUSCULAR | Status: AC
Start: 2023-09-06 — End: ?
  Filled 2023-09-06: qty 20

## 2023-09-06 MED ORDER — FENTANYL CITRATE (PF) 100 MCG/2ML IJ SOLN
INTRAMUSCULAR | Status: AC
  Filled 2023-09-06: qty 2

## 2023-09-06 MED ORDER — IOHEXOL 300 MG/ML  SOLN
100.0000 mL | Freq: Once | INTRAMUSCULAR | Status: AC | PRN
  Administered 2023-09-06: 100 mL via INTRAVENOUS

## 2023-09-06 MED ORDER — OXYCODONE HCL 5 MG PO TABS
2.5000 mg | ORAL_TABLET | ORAL | Status: DC | PRN
Start: 2023-09-06 — End: 2023-09-08

## 2023-09-06 MED ORDER — MORPHINE SULFATE (PF) 2 MG/ML IV SOLN: 2.0000 mg | INTRAVENOUS | Status: DC | PRN

## 2023-09-06 MED ORDER — KETOROLAC TROMETHAMINE 15 MG/ML IJ SOLN
15.0000 mg | Freq: Once | INTRAMUSCULAR | Status: AC
  Administered 2023-09-06: 15 mg via INTRAVENOUS
  Filled 2023-09-06: qty 1

## 2023-09-06 MED ORDER — ENOXAPARIN SODIUM 30 MG/0.3ML IJ SOSY
30.0000 mg | PREFILLED_SYRINGE | Freq: Two times a day (BID) | INTRAMUSCULAR | Status: DC
  Filled 2023-09-06: qty 0.3

## 2023-09-06 MED ORDER — SODIUM CHLORIDE 0.9% IV SOLUTION: Freq: Once | INTRAVENOUS | Status: AC

## 2023-09-06 MED ORDER — IOHEXOL 300 MG/ML  SOLN
100.0000 mL | Freq: Once | INTRAMUSCULAR | Status: AC | PRN
  Administered 2023-09-06: 30 mL via INTRA_ARTERIAL

## 2023-09-06 MED ORDER — METOPROLOL TARTRATE 5 MG/5ML IV SOLN: 5.0000 mg | Freq: Four times a day (QID) | INTRAVENOUS | Status: DC | PRN

## 2023-09-06 MED ORDER — MIDAZOLAM HCL 2 MG/2ML IJ SOLN
INTRAMUSCULAR | Status: AC | PRN
  Administered 2023-09-06: 1 mg via INTRAVENOUS

## 2023-09-06 MED ORDER — ACETAMINOPHEN 500 MG PO TABS: 1000.0000 mg | ORAL_TABLET | Freq: Four times a day (QID) | ORAL | Status: DC

## 2023-09-06 MED ORDER — POLYETHYLENE GLYCOL 3350 17 G PO PACK: 17.0000 g | PACK | Freq: Every day | ORAL | Status: DC | PRN

## 2023-09-06 MED ORDER — LACTATED RINGERS IV SOLN: INTRAVENOUS | Status: AC

## 2023-09-06 MED ORDER — MIDAZOLAM HCL 2 MG/2ML IJ SOLN
INTRAMUSCULAR | Status: AC
  Filled 2023-09-06: qty 2

## 2023-09-06 NOTE — Procedures (Signed)
 Interventional Radiology Procedure Note  Procedure: Celiac arteriography, splenic arteriography and splenic artery embolization  Access: Right common femoral artery, 5 Fr sheath  Complications: None  Estimated Blood Loss: < 10 mL  Findings: Patent celiac axis and tortuous, patent splenic artery. 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 Microcatheter able to be advanced into the superior division branch and embolization performed with multiple embolization coils resulting in occlusion of the upper division branch.  Closure performed with AngioSeal.  Allison Ivory T. Nereida Banning, M.D Pager:  [phone]
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
## 2023-09-06 NOTE — Progress Notes (Signed)
 Pt arrived from the PACU. Pt belongings are at the bedside. Pt wife took home wallet. VSS and assessment complete.Pt oriented to unit. Call-bell within reach and bed placed in the lowest position.   09/06/23 1900  Vitals  BP 121/79  MAP (mmHg) 93  Pulse Rate 82  ECG Heart Rate 87  Resp 13  Level of Consciousness  Level of Consciousness Alert  MEWS COLOR  MEWS Score Color Green  Oxygen  Therapy  SpO2 99 %  Pain Assessment  Pain Scale 0-10  Pain Score 0  PCA/Epidural/Spinal Assessment  Respiratory Pattern Regular;Unlabored  ECG Monitoring  Cardiac Rhythm NSR  MEWS Score  MEWS Temp 0  MEWS Systolic 0  MEWS Pulse 0  MEWS RR 1  MEWS LOC 0  MEWS Score 1
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
## 2023-09-06 NOTE — ED Notes (Signed)
IR PA at bedside.  

## 2023-09-06 NOTE — ED Notes (Signed)
 Patient transported to IR with IR RNs on monitor.
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
## 2023-09-06 NOTE — ED Notes (Signed)
 Pt complained of feeling like he was gone to pass out while in the restroom, bp dropped to 60's  systolic and pts's skin is clammy.  provider called to bedside.  Bp came up to 121 systolic approx 5 min after episode.
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
## 2023-09-06 NOTE — ED Notes (Signed)
   09/06/23 1430  Medical Necessity for Transport Certificate --- IF THIS TRANSPORT IS ROUND TRIP OR SCHEDULED AND REPEATED, A PHYSICIAN MUST COMPLETE THIS FORM  Transport from: (Location) [facility]  Transport to (Location) [facility]  Did the patient arrive from a Skilled Nursing Facility, Assisted Living Facility or Group Home? No  Is this the closest appropriate facility? Yes  Date of Transport Service 09/06/23  Name of Transporting Agency [facility]  Round Trip Transport? No  Reason for [facility]  Specific Services Available at 2nd Facility Other (Comment) (Trauma)  Is this a hospice patient? No  Describe the Medical Condition Laceration of spleen  Q1 Are ALL the following "true"? 1. Patient unable to get up from bed without assistance  AND  2. Unable to ambulate  AND  3. Unable to sit in a chair, including wheelchair. No  Q2 Could the patient be transported safely by other means of transportation (I.E., wheelchair van)? No  Q3 Please check any of the following conditions that apply at the time of transport: Requires IV maintenance;Requires cardiac monitoring  Electronic Signature Bruno Capri, RN  Credentials RN  Date Signed 09/06/23  Print Form Print
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
## 2023-09-06 NOTE — Progress Notes (Signed)
 Wife, Mont Antis, left with patient wallet

## 2023-09-07 LAB — CBC
HCT: 34.2 % — ABNORMAL LOW (ref 39.0–52.0)
HCT: 34.3 % — ABNORMAL LOW (ref 39.0–52.0)
HCT: 29.4 % — ABNORMAL LOW (ref 39.0–52.0)
Hemoglobin: 11.4 g/dL — ABNORMAL LOW (ref 13.0–17.0)
Hemoglobin: 11.6 g/dL — ABNORMAL LOW (ref 13.0–17.0)
Hemoglobin: 9.7 g/dL — ABNORMAL LOW (ref 13.0–17.0)
MCH: 23.3 pg — ABNORMAL LOW (ref 26.0–34.0)
MCH: 23.5 pg — ABNORMAL LOW (ref 26.0–34.0)
MCH: 22.9 pg — ABNORMAL LOW (ref 26.0–34.0)
MCHC: 33.3 g/dL (ref 30.0–36.0)
MCHC: 33.8 g/dL (ref 30.0–36.0)
MCHC: 33 g/dL (ref 30.0–36.0)
MCV: 69.9 fL — ABNORMAL LOW (ref 80.0–100.0)
MCV: 69.4 fL — ABNORMAL LOW (ref 80.0–100.0)
MCV: 69.5 fL — ABNORMAL LOW (ref 80.0–100.0)
Platelets: 97 10*3/uL — ABNORMAL LOW (ref 150–400)
Platelets: 110 10*3/uL — ABNORMAL LOW (ref 150–400)
Platelets: 95 10*3/uL — ABNORMAL LOW (ref 150–400)
RBC: 4.89 MIL/uL (ref 4.22–5.81)
RBC: 4.94 MIL/uL (ref 4.22–5.81)
RBC: 4.23 MIL/uL (ref 4.22–5.81)
RDW: 17.3 % — ABNORMAL HIGH (ref 11.5–15.5)
RDW: 17.4 % — ABNORMAL HIGH (ref 11.5–15.5)
RDW: 17.2 % — ABNORMAL HIGH (ref 11.5–15.5)
WBC: 6.8 10*3/uL (ref 4.0–10.5)
WBC: 7.7 10*3/uL (ref 4.0–10.5)
WBC: 5.6 10*3/uL (ref 4.0–10.5)
nRBC: 0 % (ref 0.0–0.2)
nRBC: 0 % (ref 0.0–0.2)
nRBC: 0 % (ref 0.0–0.2)

## 2023-09-07 LAB — BASIC METABOLIC PANEL WITH GFR
Anion gap: 10 (ref 5–15)
BUN: 20 mg/dL (ref 8–23)
CO2: 22 mmol/L (ref 22–32)
Calcium: 8.1 mg/dL — ABNORMAL LOW (ref 8.9–10.3)
Chloride: 108 mmol/L (ref 98–111)
Creatinine, Ser: 1.44 mg/dL — ABNORMAL HIGH (ref 0.61–1.24)
GFR, Estimated: 52 mL/min — ABNORMAL LOW (ref >60)
Glucose, Bld: 97 mg/dL (ref 70–99)
Potassium: 4.6 mmol/L (ref 3.5–5.1)
Sodium: 140 mmol/L (ref 135–145)
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
## 2023-09-07 NOTE — Progress Notes (Signed)
 OT Cancellation Note  Patient Details Name: Mark [facility] MRN: [id number] DOB: 1950-08-30   Cancelled Treatment:    Reason Eval/Treat Not Completed: OT screened, no needs identified, will sign off. Per PT, pt mobilizing and performing ADL without assist, cognitive screen [facility]. Please re-consult if change in status.   Karilyn Ouch, OTR/L [facility] Acute Rehabilitation Office: [phone]  Emery Hans 09/07/2023, 12:21 PM
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
## 2023-09-08 LAB — BASIC METABOLIC PANEL WITH GFR
Anion gap: 9 (ref 5–15)
BUN: 22 mg/dL (ref 8–23)
CO2: 25 mmol/L (ref 22–32)
Calcium: 8.5 mg/dL — ABNORMAL LOW (ref 8.9–10.3)
Chloride: 106 mmol/L (ref 98–111)
Creatinine, Ser: 1.29 mg/dL — ABNORMAL HIGH (ref 0.61–1.24)
GFR, Estimated: 59 mL/min — ABNORMAL LOW (ref >60)
Glucose, Bld: 102 mg/dL — ABNORMAL HIGH (ref 70–99)
Potassium: 4.3 mmol/L (ref 3.5–5.1)
Sodium: 140 mmol/L (ref 135–145)

## 2023-09-08 LAB — CBC
HCT: 31.8 % — ABNORMAL LOW (ref 39.0–52.0)
Hemoglobin: 10.7 g/dL — ABNORMAL LOW (ref 13.0–17.0)
MCH: 23.9 pg — ABNORMAL LOW (ref 26.0–34.0)
MCHC: 33.6 g/dL (ref 30.0–36.0)
MCV: 71 fL — ABNORMAL LOW (ref 80.0–100.0)
Platelets: 91 10*3/uL — ABNORMAL LOW (ref 150–400)
RBC: 4.48 MIL/uL (ref 4.22–5.81)
RDW: 17.8 % — ABNORMAL HIGH (ref 11.5–15.5)
WBC: 6.4 10*3/uL (ref 4.0–10.5)
nRBC: 0 % (ref 0.0–0.2)

## 2023-09-08 MED ORDER — LACTATED RINGERS IV BOLUS
1000.0000 mL | Freq: Once | INTRAVENOUS | Status: AC
  Administered 2023-09-08: 1000 mL via INTRAVENOUS

## 2023-09-08 NOTE — Progress Notes (Signed)
 Patient given 1L LR bolus through R forearm IV prior to taking out both IVs, catheter tips intact. This nurse went over discharge instructions with patient, patient denied any questions. Patient walked down to his daughters car with Judeen Nose NT.

## 2023-09-08 NOTE — Progress Notes (Addendum)
 BMP reviewed, creatinine downtrending, will give another liter of LR, but okay for discharge. Patient has declined all pain medication, including tylenol , while inpatient and declines prescriptions for pain medication at discharge.   Anda Bamberg, MD General and Trauma Surgery [facility] Surgery
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
## 2023-09-10 ENCOUNTER — Other Ambulatory Visit: Payer: Self-pay | Admitting: Surgery

## 2023-09-10 ENCOUNTER — Telehealth: Payer: Self-pay

## 2023-09-10 DIAGNOSIS — S36039S Unspecified laceration of spleen, sequela: Secondary | ICD-10-CM

## 2023-09-10 LAB — TYPE AND SCREEN
ABO/RH(D): AB POS
Antibody Screen: NEGATIVE
Unit division: 0
Unit division: 0
Unit division: 0

## 2023-09-10 LAB — BPAM RBC
Blood Product Expiration Date: 202506052359
Blood Product Expiration Date: 202506282359
Blood Product Expiration Date: 202506282359
ISSUE DATE / TIME: 202505231407
Unit Type and Rh: 6200
Unit Type and Rh: 6200
Unit Type and Rh: 9500

## 2023-09-10 NOTE — Transitions of Care (Post Inpatient/ED Visit) (Signed)
   09/10/2023  Name: KELE BARTHELEMY MRN: [id number] DOB: 1950-11-08  Today's TOC FU Call Status: Today's TOC FU Call Status:: Unsuccessful Call (1st Attempt) Unsuccessful Call (1st Attempt) Date: 09/10/23  Attempted to reach the patient regarding the most recent Inpatient/ED visit.  Follow Up Plan: Additional outreach attempts will be made to reach the patient to complete the Transitions of Care (Post Inpatient/ED visit) call.   Orpha Blade, RN, BSN, CEN [employer]- Transition of Care Team.  [facility] [phone]

## 2023-09-11 ENCOUNTER — Telehealth: Payer: Self-pay | Admitting: *Deleted

## 2023-09-11 ENCOUNTER — Encounter: Payer: Self-pay | Admitting: Family Medicine

## 2023-09-11 NOTE — Transitions of Care (Post Inpatient/ED Visit) (Signed)
   09/11/2023  Name: WINDSOR GOEKEN MRN: [id number] DOB: [birth year]  Today's TOC FU Call Status: Today's TOC FU Call Status:: Unsuccessful Call (2nd Attempt) Unsuccessful Call (2nd Attempt) Date: 09/11/23  Attempted to reach the patient regarding the most recent Inpatient/ED visit.  Follow Up Plan: Additional outreach attempts will be made to reach the patient to complete the Transitions of Care (Post Inpatient/ED visit) call.   Una Ganser BSN RN [place] [facility] Health Care Management Coordinator [email or web address] Direct Dial: [phone]  Fax: [phone] Website: [email or web address]

## 2023-09-12 ENCOUNTER — Telehealth: Payer: Self-pay

## 2023-09-13 ENCOUNTER — Encounter: Payer: Self-pay | Admitting: Family Medicine
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
## 2023-09-16 ENCOUNTER — Telehealth: Payer: Self-pay | Admitting: Family Medicine

## 2023-09-16 NOTE — Telephone Encounter (Signed)
 FMLA forms  daughter to help out   Noted Copied Sleeved  Original forms place in provider box Dr Rodolph Clap  Copy forms in folder at front desk

## 2023-09-17 NOTE — Telephone Encounter (Signed)
Given to provider to sign
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
## 2023-09-19 ENCOUNTER — Other Ambulatory Visit: Payer: Self-pay
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
## 2023-09-20 ENCOUNTER — Telehealth: Payer: Self-pay

## 2023-09-20 NOTE — Transitions of Care (Post Inpatient/ED Visit) (Signed)
 09/20/2023  Patient ID: Mark Sandoval, male   DOB: 1950/10/30, 73 y.o.   MRN: [id number]  Voicemail received from patient. Spoke with leadership. Placed call to patient. I reviewed purpose of call and details of the [facility] program. Patient voiced that he did not understand program.  Patient denies any needs at this time.   I have encouraged patient to call MD and keep follow up appointments. Reviewed importance of getting emergency care if needed.   No goals to re enroll.  Orpha Blade, RN, BSN, CEN [employer]- Transition of Care Team.  [facility] [phone]

## 2023-09-22 ENCOUNTER — Encounter: Payer: Self-pay | Admitting: Family Medicine

## 2023-09-23 ENCOUNTER — Other Ambulatory Visit: Payer: Self-pay

## 2023-09-23 DIAGNOSIS — E785 Hyperlipidemia, unspecified: Secondary | ICD-10-CM

## 2023-09-23 DIAGNOSIS — K5730 Diverticulosis of large intestine without perforation or abscess without bleeding: Secondary | ICD-10-CM

## 2023-09-23 DIAGNOSIS — E559 Vitamin D deficiency, unspecified: Secondary | ICD-10-CM

## 2023-09-23 DIAGNOSIS — I10 Essential (primary) hypertension: Secondary | ICD-10-CM

## 2023-09-23 NOTE — Telephone Encounter (Signed)
 Completed. Placed up front

## 2023-09-25 ENCOUNTER — Other Ambulatory Visit (HOSPITAL_COMMUNITY)
Admission: RE | Admit: 2023-09-25 | Discharge: 2023-09-25 | Disposition: A | Discharge status: Home / Self Care | Source: Ambulatory Visit | Attending: Family Medicine | Admitting: Family Medicine

## 2023-09-25 DIAGNOSIS — E785 Hyperlipidemia, unspecified: Secondary | ICD-10-CM | POA: Insufficient documentation

## 2023-09-25 DIAGNOSIS — I10 Essential (primary) hypertension: Secondary | ICD-10-CM | POA: Diagnosis not present

## 2023-09-25 DIAGNOSIS — K5730 Diverticulosis of large intestine without perforation or abscess without bleeding: Secondary | ICD-10-CM | POA: Diagnosis not present

## 2023-09-25 DIAGNOSIS — E559 Vitamin D deficiency, unspecified: Secondary | ICD-10-CM | POA: Insufficient documentation

## 2023-09-25 LAB — CBC WITH DIFFERENTIAL/PLATELET
Abs Immature Granulocytes: 0.02 10*3/uL (ref 0.00–0.07)
Basophils Absolute: 0.1 10*3/uL (ref 0.0–0.1)
Basophils Relative: 1 %
Eosinophils Absolute: 0 10*3/uL (ref 0.0–0.5)
Eosinophils Relative: 1 %
HCT: 36.5 % — ABNORMAL LOW (ref 39.0–52.0)
Hemoglobin: 12.2 g/dL — ABNORMAL LOW (ref 13.0–17.0)
Immature Granulocytes: 0 %
Lymphocytes Relative: 22 %
Lymphs Abs: 1.1 10*3/uL (ref 0.7–4.0)
MCH: 23.7 pg — ABNORMAL LOW (ref 26.0–34.0)
MCHC: 33.4 g/dL (ref 30.0–36.0)
MCV: 70.9 fL — ABNORMAL LOW (ref 80.0–100.0)
Monocytes Absolute: 0.4 10*3/uL (ref 0.1–1.0)
Monocytes Relative: 8 %
Neutro Abs: 3.4 10*3/uL (ref 1.7–7.7)
Neutrophils Relative %: 68 %
Platelets: 379 10*3/uL (ref 150–400)
RBC: 5.15 MIL/uL (ref 4.22–5.81)
RDW: 18.6 % — ABNORMAL HIGH (ref 11.5–15.5)
WBC: 4.9 10*3/uL (ref 4.0–10.5)
nRBC: 0 % (ref 0.0–0.2)

## 2023-09-25 LAB — COMPREHENSIVE METABOLIC PANEL WITH GFR
ALT: 42 U/L (ref 0–44)
AST: 33 U/L (ref 15–41)
Albumin: 3.7 g/dL (ref 3.5–5.0)
Alkaline Phosphatase: 99 U/L (ref 38–126)
Anion gap: 9 (ref 5–15)
BUN: 13 mg/dL (ref 8–23)
CO2: 26 mmol/L (ref 22–32)
Calcium: 9.1 mg/dL (ref 8.9–10.3)
Chloride: 103 mmol/L (ref 98–111)
Creatinine, Ser: 1.25 mg/dL — ABNORMAL HIGH (ref 0.61–1.24)
GFR, Estimated: >60 mL/min (ref >60)
Glucose, Bld: 96 mg/dL (ref 70–99)
Potassium: 4.3 mmol/L (ref 3.5–5.1)
Sodium: 138 mmol/L (ref 135–145)
Total Bilirubin: 0.7 mg/dL (ref 0.0–1.2)
Total Protein: 7.3 g/dL (ref 6.5–8.1)

## 2023-09-25 LAB — LIPID PANEL
Cholesterol: 123 mg/dL (ref 0–200)
HDL: 41 mg/dL (ref >40)
LDL Cholesterol: 71 mg/dL (ref 0–99)
Total CHOL/HDL Ratio: 3 ratio
Triglycerides: 55 mg/dL (ref <150)
VLDL: 11 mg/dL (ref 0–40)

## 2023-09-25 LAB — TSH: TSH: 0.955 u[IU]/mL (ref 0.350–4.500)

## 2023-09-25 LAB — VITAMIN D 25 HYDROXY (VIT D DEFICIENCY, FRACTURES): Vit D, 25-Hydroxy: 41.24 ng/mL (ref 30–100)

## 2023-09-27 ENCOUNTER — Encounter: Payer: Self-pay | Admitting: Family Medicine

## 2023-09-27 ENCOUNTER — Ambulatory Visit: Payer: Medicare Other | Admitting: Family Medicine

## 2023-09-27 VITALS — BP 122/72 | HR 88 | Resp 16 | Ht 74.0 in | Wt 180.0 lb

## 2023-09-27 DIAGNOSIS — I10 Essential (primary) hypertension: Secondary | ICD-10-CM | POA: Diagnosis not present

## 2023-09-27 DIAGNOSIS — D472 Monoclonal gammopathy: Secondary | ICD-10-CM

## 2023-09-27 DIAGNOSIS — S36039D Unspecified laceration of spleen, subsequent encounter: Secondary | ICD-10-CM | POA: Diagnosis not present

## 2023-09-27 DIAGNOSIS — D72819 Decreased white blood cell count, unspecified: Secondary | ICD-10-CM | POA: Diagnosis not present

## 2023-09-27 DIAGNOSIS — E785 Hyperlipidemia, unspecified: Secondary | ICD-10-CM
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
## 2023-10-01 ENCOUNTER — Ambulatory Visit
Admission: RE | Admit: 2023-10-01 | Discharge: 2023-10-01 | Disposition: A | Discharge status: Home / Self Care | Source: Ambulatory Visit | Attending: Surgery

## 2023-10-01 ENCOUNTER — Encounter: Payer: Self-pay | Admitting: Family Medicine

## 2023-10-01 DIAGNOSIS — Z09 Encounter for follow-up examination after completed treatment for conditions other than malignant neoplasm: Secondary | ICD-10-CM

## 2023-10-01 DIAGNOSIS — S36039D Unspecified laceration of spleen, subsequent encounter: Secondary | ICD-10-CM

## 2023-10-01 DIAGNOSIS — S36039S Unspecified laceration of spleen, sequela: Secondary | ICD-10-CM

## 2023-10-01 MED ORDER — IOPAMIDOL (ISOVUE-370) INJECTION 76%
75.0000 mL | Freq: Once | INTRAVENOUS | Status: AC | PRN
  Administered 2023-10-01: 75 mL via INTRAVENOUS

## 2023-10-07 ENCOUNTER — Encounter: Payer: Self-pay | Admitting: Family Medicine

## 2023-10-07 NOTE — Assessment & Plan Note (Signed)
 Hyperlipidemia:Low fat diet discussed and encouraged.   Lipid Panel  Lab Results  Component Value Date   CHOL 123 09/25/2023   HDL 41 09/25/2023   LDLCALC 71 09/25/2023   TRIG 55 09/25/2023   CHOLHDL 3.0 09/25/2023     .cdon1

## 2023-10-07 NOTE — Assessment & Plan Note (Signed)
 Controlled, no change in medication DASH diet and commitment to daily physical activity for a minimum of 30 minutes discussed and encouraged, as a part of hypertension management. The importance of attaining a healthy weight is also discussed.     09/27/2023    8:08 AM 09/12/2023    9:00 AM 09/08/2023    7:25 AM 09/08/2023    4:00 AM 09/07/2023   11:08 PM 09/07/2023    7:26 PM 09/07/2023    4:09 PM  BP/Weight  Systolic BP 122 125 133 123 97 115 95  Diastolic BP 72 70 82 75 63 70 72  Wt. (Lbs) 180.04        BMI 23.12 kg/m2

## 2023-10-07 NOTE — Assessment & Plan Note (Signed)
 Current lab shows stability
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
## 2023-10-07 NOTE — Assessment & Plan Note (Signed)
Followed at [facility]  
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
## 2023-10-15 ENCOUNTER — Encounter: Payer: Self-pay | Admitting: General Surgery

## 2023-10-15 ENCOUNTER — Ambulatory Visit (INDEPENDENT_AMBULATORY_CARE_PROVIDER_SITE_OTHER): Admitting: General Surgery

## 2023-10-15 VITALS — BP 131/88 | HR 88 | Temp 98.4°F | Resp 12 | Ht 74.0 in | Wt 179.0 lb

## 2023-10-15 DIAGNOSIS — S36039A Unspecified laceration of spleen, initial encounter: Secondary | ICD-10-CM
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
## 2023-10-19 ENCOUNTER — Other Ambulatory Visit: Payer: Self-pay | Admitting: Family Medicine

## 2023-10-24 DIAGNOSIS — D582 Other hemoglobinopathies: Secondary | ICD-10-CM | POA: Diagnosis not present

## 2023-10-24 DIAGNOSIS — D472 Monoclonal gammopathy: Secondary | ICD-10-CM | POA: Diagnosis not present

## 2023-10-24 DIAGNOSIS — D696 Thrombocytopenia, unspecified: Secondary | ICD-10-CM | POA: Diagnosis not present

## 2023-10-24 DIAGNOSIS — D709 Neutropenia, unspecified: Secondary | ICD-10-CM | POA: Diagnosis not present

## 2023-11-21 DIAGNOSIS — S3600XA Unspecified injury of spleen, initial encounter: Secondary | ICD-10-CM | POA: Diagnosis not present

## 2023-12-26 DIAGNOSIS — I10 Essential (primary) hypertension: Secondary | ICD-10-CM | POA: Diagnosis not present

## 2023-12-26 DIAGNOSIS — Z23 Encounter for immunization: Secondary | ICD-10-CM | POA: Diagnosis not present

## 2023-12-26 DIAGNOSIS — N182 Chronic kidney disease, stage 2 (mild): Secondary | ICD-10-CM | POA: Diagnosis not present

## 2023-12-26 DIAGNOSIS — I129 Hypertensive chronic kidney disease with stage 1 through stage 4 chronic kidney disease, or unspecified chronic kidney disease: Secondary | ICD-10-CM | POA: Diagnosis not present

## 2023-12-28 DIAGNOSIS — Z23 Encounter for immunization: Secondary | ICD-10-CM | POA: Diagnosis not present

## 2024-01-28 ENCOUNTER — Other Ambulatory Visit: Payer: Self-pay | Admitting: Family Medicine

## 2024-02-14 ENCOUNTER — Encounter: Payer: Self-pay | Admitting: Family Medicine

## 2024-02-14 NOTE — Telephone Encounter (Signed)
 Pt called again to make sure he gets the right dosage of medication. Please advise.

## 2024-02-28 DIAGNOSIS — D472 Monoclonal gammopathy: Secondary | ICD-10-CM | POA: Diagnosis not present

## 2024-03-05 ENCOUNTER — Other Ambulatory Visit: Payer: Self-pay | Admitting: Family Medicine

## 2024-03-09 ENCOUNTER — Ambulatory Visit (INDEPENDENT_AMBULATORY_CARE_PROVIDER_SITE_OTHER): Payer: Medicare Other

## 2024-03-09 VITALS — BP 122/70 | HR 83 | Resp 12 | Ht 74.0 in | Wt 182.0 lb

## 2024-03-09 DIAGNOSIS — Z0000 Encounter for general adult medical examination without abnormal findings: Secondary | ICD-10-CM

## 2024-03-09 NOTE — Patient Instructions (Signed)
 Mr. Gulley,  Thank you for taking the time for your Medicare Wellness Visit. I appreciate your continued commitment to your health goals. Please review the care plan we discussed, and feel free to reach out if I can assist you further.  Please note that Annual Wellness Visits do not include a physical exam. Some assessments may be limited, especially if the visit was conducted virtually. If needed, we may recommend an in-person follow-up with your provider.  Ongoing Care Seeing your primary care provider every 3 to 6 months helps us  monitor your health and provide consistent, personalized care.   Referrals If a referral was made during today's visit and you haven't received any updates within two weeks, please contact the referred provider directly to check on the status.   Recommended Screenings:  Health Maintenance  Topic Date Due   COVID-19 Vaccine (9 - 2025-26 season) 12/16/2023   Medicare Annual Wellness Visit  03/09/2025   Colon Cancer Screening  03/29/2026   DTaP/Tdap/Td vaccine (3 - Td or Tdap) 01/02/2027   Pneumococcal Vaccine for age over 83  Completed   Flu Shot  Completed   Hepatitis C Screening  Completed   Zoster (Shingles) Vaccine  Completed   Meningitis B Vaccine  Aged Out       03/08/2024    9:36 AM  Advanced Directives  Does Patient Have a Medical Advance Directive? Yes  Type of Estate Agent of [place];Living will  Copy of Healthcare Power of Attorney in Chart? No - copy requested    Vision: Annual vision screenings are recommended for early detection of glaucoma, cataracts, and diabetic retinopathy. These exams can also reveal signs of chronic conditions such as diabetes and high blood pressure.  Dental: Annual dental screenings help detect early signs of oral cancer, gum disease, and other conditions linked to overall health, including heart disease and diabetes.  Please see the attached documents for additional preventive care  recommendations.
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
## 2024-03-21 DIAGNOSIS — Z23 Encounter for immunization: Secondary | ICD-10-CM | POA: Diagnosis not present

## 2024-03-23 ENCOUNTER — Encounter: Payer: Self-pay | Admitting: Family Medicine

## 2024-03-24 ENCOUNTER — Other Ambulatory Visit: Payer: Self-pay | Admitting: Family Medicine

## 2024-03-24 ENCOUNTER — Telehealth: Payer: Self-pay | Admitting: Family Medicine

## 2024-03-24 DIAGNOSIS — I10 Essential (primary) hypertension: Secondary | ICD-10-CM

## 2024-03-24 DIAGNOSIS — E7849 Other hyperlipidemia: Secondary | ICD-10-CM

## 2024-03-24 NOTE — Telephone Encounter (Signed)
See pt advice.

## 2024-03-24 NOTE — Telephone Encounter (Signed)
 Does patient need to do blood work, did not see any ordered.   Copied from CRM #[id number]. Topic: General - Other >> Mar 24, 2024  9:36 AM Zebedee SAUNDERS wrote: Reason for CRM: Pt called stated for appt on Fri 03/27/2024 does he need lab work and if so have the lab orders been sent to the hospital? Please let pt know via MyChart.

## 2024-03-24 NOTE — Progress Notes (Signed)
 Labs ordered to be drawn in next 2 days

## 2024-03-25 DIAGNOSIS — I10 Essential (primary) hypertension: Secondary | ICD-10-CM | POA: Diagnosis not present

## 2024-03-25 DIAGNOSIS — E7849 Other hyperlipidemia: Secondary | ICD-10-CM | POA: Diagnosis not present

## 2024-03-26 LAB — CMP14+EGFR
ALT: 24 IU/L (ref 0–44)
AST: 31 IU/L (ref 0–40)
Albumin: 4.3 g/dL (ref 3.8–4.8)
Alkaline Phosphatase: 112 IU/L (ref 47–123)
BUN/Creatinine Ratio: 10 (ref 10–24)
BUN: 12 mg/dL (ref 8–27)
Bilirubin Total: 0.5 mg/dL (ref 0.0–1.2)
CO2: 26 mmol/L (ref 20–29)
Calcium: 9.1 mg/dL (ref 8.6–10.2)
Chloride: 100 mmol/L (ref 96–106)
Creatinine, Ser: 1.19 mg/dL (ref 0.76–1.27)
Globulin, Total: 2.3 g/dL (ref 1.5–4.5)
Glucose: 83 mg/dL (ref 70–99)
Potassium: 4 mmol/L (ref 3.5–5.2)
Sodium: 139 mmol/L (ref 134–144)
Total Protein: 6.6 g/dL (ref 6.0–8.5)
eGFR: 64 mL/min/1.73 (ref >59)

## 2024-03-26 LAB — LIPID PANEL W/O CHOL/HDL RATIO
Cholesterol, Total: 139 mg/dL (ref 100–199)
HDL: 64 mg/dL (ref >39)
LDL Chol Calc (NIH): 63 mg/dL (ref 0–99)
Triglycerides: 54 mg/dL (ref 0–149)
VLDL Cholesterol Cal: 12 mg/dL (ref 5–40)

## 2024-03-27 ENCOUNTER — Ambulatory Visit: Admitting: Family Medicine

## 2024-03-27 ENCOUNTER — Encounter: Payer: Self-pay | Admitting: Family Medicine

## 2024-03-27 VITALS — BP 126/76 | HR 77 | Resp 14 | Ht 73.0 in | Wt 186.0 lb

## 2024-03-27 DIAGNOSIS — S36039D Unspecified laceration of spleen, subsequent encounter: Secondary | ICD-10-CM

## 2024-03-27 DIAGNOSIS — I10 Essential (primary) hypertension: Secondary | ICD-10-CM | POA: Diagnosis not present

## 2024-03-27 DIAGNOSIS — E559 Vitamin D deficiency, unspecified: Secondary | ICD-10-CM | POA: Insufficient documentation

## 2024-03-27 DIAGNOSIS — E785 Hyperlipidemia, unspecified: Secondary | ICD-10-CM

## 2024-03-27 DIAGNOSIS — D472 Monoclonal gammopathy: Secondary | ICD-10-CM

## 2024-03-27 NOTE — Assessment & Plan Note (Signed)
 Updated lab needed at/ before next visit.

## 2024-03-27 NOTE — Assessment & Plan Note (Signed)
 Annual follow up at [facility] who is managing the disease

## 2024-03-27 NOTE — Progress Notes (Signed)
° °  Mark Sandoval     MRN: [id number]      DOB: [birth year]  Chief Complaint  Patient presents with   Hypertension    Follow up     HPI Mark Sandoval is here for follow up and re-evaluation of chronic medical conditions, medication management and review of any available recent lab and radiology data.  Preventive health is updated, specifically  Cancer screening and Immunization.   Reports symptom resolution from splemnic trauma The PT denies any adverse reactions to current medications since the last visit.  There are no new concerns.  There are no specific complaints   ROS Denies recent fever or chills. Denies sinus pressure, nasal congestion, ear pain or sore throat. Denies chest congestion, productive cough or wheezing. Denies chest pains, palpitations and leg swelling Denies abdominal pain, nausea, vomiting,diarrhea or constipation.   Denies dysuria, frequency, hesitancy or incontinence. Denies joint pain, swelling and limitation in mobility. Denies headaches, seizures, numbness, or tingling. Denies depression, anxiety or insomnia. Denies skin break down or rash.   PE  BP 126/76   Pulse 77   Resp 14   Ht 6' 1 (1.854 m)   Wt 186 lb 0.6 oz (84.4 kg)   SpO2 98%   BMI 24.55 kg/m   Patient alert and oriented and in no cardiopulmonary distress.  HEENT: No facial asymmetry, EOMI,     Neck supple .  Chest: Clear to auscultation bilaterally.  CVS: S1, S2 no murmurs, no S3.Regular rate.  ABD: Soft non tender.   Ext: No edema  MS: Adequate ROM spine, shoulders, hips and knees.  Skin: Intact, no ulcerations or rash noted.  Psych: Good eye contact, normal affect. Memory intact not anxious or depressed appearing.  CNS: CN 2-12 intact, power,  normal throughout.no focal deficits noted.   Assessment & Plan  Essential hypertension Controlled, no change in medication DASH diet and commitment to daily physical activity for a minimum of 30 minutes discussed and  encouraged, as a part of hypertension management. The importance of attaining a healthy weight is also discussed.     03/27/2024    8:36 AM 03/09/2024    8:08 AM 10/15/2023    1:48 PM 09/27/2023    8:08 AM 09/12/2023    9:00 AM 09/08/2023    7:25 AM 09/08/2023    4:00 AM  BP/Weight  Systolic BP 126 122 131 122 125 133 123  Diastolic BP 76 70 88 72 70 82 75  Wt. (Lbs) 186.04 182 179 180.04     BMI 24.55 kg/m2 23.37 kg/m2 22.98 kg/m2 23.12 kg/m2          Dyslipidemia, goal LDL below 100 Hyperlipidemia:Low fat diet discussed and encouraged.   Lipid Panel  Lab Results  Component Value Date   CHOL 139 03/25/2024   HDL 64 03/25/2024   LDLCALC 63 03/25/2024   TRIG 54 03/25/2024   CHOLHDL 3.0 09/25/2023     Controlled, no change in medication   MGUS (monoclonal gammopathy of unknown significance) [facility] follow up at [facility] who is managing the disease  Splenic laceration Reports symptom resolution and has been re evaluated g by surgery I reviewed recors abdominal scan in 11/2023 reports new pleural effusion and hematomas in spleen, will likely need additional f/u  Vitamin D  deficiency Updated lab needed at/ before next visit.

## 2024-03-27 NOTE — Assessment & Plan Note (Addendum)
 Reports symptom resolution and has been re evaluated g by surgery I reviewed recors abdominal scan in 11/2023 reports new pleural effusion and hematomas in spleen, will likely need additional f/u

## 2024-03-27 NOTE — Patient Instructions (Addendum)
 F/u in 6 months  Fasting lipid, cmp and eGFr, TSh and vit D 3 to 5 days before next visit (obtain at [facility])  Congrats on excellent health care  Keep exercising  and good sleep habits  Thanks for choosing [facility], we consider it a privelige to serve you.

## 2024-03-27 NOTE — Assessment & Plan Note (Signed)
 Hyperlipidemia:Low fat diet discussed and encouraged.   Lipid Panel  Lab Results  Component Value Date   CHOL 139 03/25/2024   HDL 64 03/25/2024   LDLCALC 63 03/25/2024   TRIG 54 03/25/2024   CHOLHDL 3.0 09/25/2023     Controlled, no change in medication

## 2024-03-27 NOTE — Assessment & Plan Note (Signed)
 Controlled, no change in medication DASH diet and commitment to daily physical activity for a minimum of 30 minutes discussed and encouraged, as a part of hypertension management. The importance of attaining a healthy weight is also discussed.     03/27/2024    8:36 AM 03/09/2024    8:08 AM 10/15/2023    1:48 PM 09/27/2023    8:08 AM 09/12/2023    9:00 AM 09/08/2023    7:25 AM 09/08/2023    4:00 AM  BP/Weight  Systolic BP 126 122 131 122 125 133 123  Diastolic BP 76 70 88 72 70 82 75  Wt. (Lbs) 186.04 182 179 180.04     BMI 24.55 kg/m2 23.37 kg/m2 22.98 kg/m2 23.12 kg/m2

## 2024-05-18 ENCOUNTER — Encounter: Payer: Self-pay | Admitting: Family Medicine

## 2024-05-18 ENCOUNTER — Other Ambulatory Visit: Payer: Self-pay | Admitting: Family Medicine

## 2024-09-25 ENCOUNTER — Ambulatory Visit: Payer: Self-pay | Admitting: Family Medicine

## 2025-03-16 ENCOUNTER — Ambulatory Visit
